# Patient Record
Sex: Female | Born: 1990 | Race: Black or African American | Hispanic: No | Marital: Single | Smoking: Former smoker
Health system: Southern US, Community
[De-identification: ages and names within clinical notes are randomized; demographics above are authoritative.]

## PROBLEM LIST (undated history)

## (undated) ENCOUNTER — Inpatient Hospital Stay (HOSPITAL_COMMUNITY): Payer: Self-pay

## (undated) ENCOUNTER — Emergency Department (HOSPITAL_COMMUNITY): Discharge status: Against Medical Advice | Payer: Self-pay

## (undated) DIAGNOSIS — O98812 Other maternal infectious and parasitic diseases complicating pregnancy, second trimester: Secondary | ICD-10-CM

## (undated) DIAGNOSIS — A749 Chlamydial infection, unspecified: Secondary | ICD-10-CM

## (undated) DIAGNOSIS — K219 Gastro-esophageal reflux disease without esophagitis: Secondary | ICD-10-CM

## (undated) DIAGNOSIS — F1911 Other psychoactive substance abuse, in remission: Secondary | ICD-10-CM

## (undated) DIAGNOSIS — O139 Gestational [pregnancy-induced] hypertension without significant proteinuria, unspecified trimester: Secondary | ICD-10-CM

## (undated) HISTORY — DX: Chlamydial infection, unspecified: A74.9

## (undated) HISTORY — DX: Other maternal infectious and parasitic diseases complicating pregnancy, second trimester: O98.812

## (undated) HISTORY — DX: Other psychoactive substance abuse, in remission: F19.11

## (undated) HISTORY — PX: WISDOM TOOTH EXTRACTION: SHX21

## (undated) HISTORY — PX: DILATION AND CURETTAGE OF UTERUS: SHX78

---
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
## 2008-06-09 ENCOUNTER — Emergency Department (HOSPITAL_COMMUNITY): Admission: EM | Admit: 2008-06-09 | Discharge: 2008-06-09 | Payer: Self-pay | Admitting: Emergency Medicine

## 2009-07-16 ENCOUNTER — Emergency Department (HOSPITAL_COMMUNITY): Admission: EM | Admit: 2009-07-16 | Discharge: 2009-07-16 | Payer: Self-pay | Admitting: Family Medicine

## 2010-09-13 LAB — POCT URINALYSIS DIP (DEVICE)
Glucose, UA: NEGATIVE mg/dL
Ketones, ur: NEGATIVE mg/dL
Specific Gravity, Urine: >=1.03 (ref 1.005–1.030)
Urobilinogen, UA: 0.2 mg/dL (ref 0.0–1.0)

## 2010-09-13 LAB — URINE CULTURE: Colony Count: 2000

## 2010-09-13 LAB — POCT PREGNANCY, URINE: Preg Test, Ur: POSITIVE

## 2012-09-06 ENCOUNTER — Encounter (HOSPITAL_COMMUNITY): Payer: Self-pay | Admitting: Family

## 2012-09-06 ENCOUNTER — Inpatient Hospital Stay (HOSPITAL_COMMUNITY)
Admission: AD | Admit: 2012-09-06 | Discharge: 2012-09-06 | Disposition: A | Discharge status: Home / Self Care | Payer: Self-pay | Source: Ambulatory Visit | Attending: Obstetrics and Gynecology | Admitting: Obstetrics and Gynecology

## 2012-09-06 DIAGNOSIS — B9689 Other specified bacterial agents as the cause of diseases classified elsewhere: Secondary | ICD-10-CM | POA: Insufficient documentation

## 2012-09-06 DIAGNOSIS — N390 Urinary tract infection, site not specified: Secondary | ICD-10-CM | POA: Insufficient documentation

## 2012-09-06 DIAGNOSIS — A499 Bacterial infection, unspecified: Secondary | ICD-10-CM | POA: Insufficient documentation

## 2012-09-06 DIAGNOSIS — R109 Unspecified abdominal pain: Secondary | ICD-10-CM | POA: Insufficient documentation

## 2012-09-06 DIAGNOSIS — N760 Acute vaginitis: Secondary | ICD-10-CM | POA: Insufficient documentation

## 2012-09-06 LAB — URINE MICROSCOPIC-ADD ON

## 2012-09-06 LAB — URINALYSIS, ROUTINE W REFLEX MICROSCOPIC
Bilirubin Urine: NEGATIVE
Ketones, ur: NEGATIVE mg/dL
Nitrite: NEGATIVE
Protein, ur: NEGATIVE mg/dL
Urobilinogen, UA: 0.2 mg/dL (ref 0.0–1.0)

## 2012-09-06 LAB — WET PREP, GENITAL

## 2012-09-06 LAB — POCT PREGNANCY, URINE: Preg Test, Ur: NEGATIVE

## 2012-09-06 MED ORDER — SULFAMETHOXAZOLE-TRIMETHOPRIM 800-160 MG PO TABS: 1.0000 | ORAL_TABLET | Freq: Two times a day (BID) | ORAL | Status: DC

## 2012-09-06 MED ORDER — METRONIDAZOLE 500 MG PO TABS: 500.0000 mg | ORAL_TABLET | Freq: Two times a day (BID) | ORAL | Status: DC

## 2012-09-06 NOTE — MAU Note (Signed)
Pt states here for lower abd pain noted x2-3 days. Denies bleeding or vag d/c changes.

## 2012-09-06 NOTE — MAU Provider Note (Signed)
History     CSN: [id number]  Arrival date and time: 09/06/12 1624   First Provider Initiated Contact with Patient 09/06/12 1655      Chief Complaint  Patient presents with  . Abdominal Pain   HPI  Pt is here with report of lower abdominal pain x 2-3.  Denies bleeding or vaginal discharge changes.  Pain is in the lower midpelvis.  Patient's last menstrual period was 08/16/2012.  +sexually active, no birth control method at this time.    No past medical history on file.  Past Surgical History  Procedure Laterality Date  . Wisdom tooth extraction      No family history on file.  History  Substance Use Topics  . Smoking status: Not on file  . Smokeless tobacco: Not on file  . Alcohol Use: Not on file    Allergies: No Known Allergies  No prescriptions prior to admission    Review of Systems  Gastrointestinal: Positive for nausea. Negative for vomiting. Abdominal pain: midpelvis.  Genitourinary: Negative.    Physical Exam   Last menstrual period 08/16/2012.  Physical Exam  Constitutional: She is oriented to person, place, and time. She appears well-developed and well-nourished.  HENT:  Head: Normocephalic.  Neck: Normal range of motion. Neck supple.  Cardiovascular: Normal rate, regular rhythm and normal heart sounds.   Respiratory: Effort normal and breath sounds normal. No respiratory distress.  GI: Soft. She exhibits no mass. There is tenderness (lower midpelvis). There is no rebound, no guarding and no CVA tenderness.  Genitourinary: Cervix exhibits no motion tenderness, no discharge and no friability. Right adnexum displays no mass, no tenderness and no fullness. Left adnexum displays no mass, no tenderness and no fullness. Vaginal discharge (white, creamy) found.  Musculoskeletal: Normal range of motion.  Neurological: She is alert and oriented to person, place, and time.  Skin: Skin is warm and dry.  Psychiatric: She has a normal mood and affect.   Filed  Vitals:   09/06/12 1652  BP: 114/65  Pulse: 90  Temp: 97.8 F (36.6 C)  Resp: 16     MAU Course  Procedures  Results for orders placed during the hospital encounter of 09/06/12 (from the past 24 hour(s))  URINALYSIS, ROUTINE W REFLEX MICROSCOPIC     Status: Abnormal   Collection Time    09/06/12  4:32 PM      Result Value Range   Color, Urine YELLOW  YELLOW   APPearance CLEAR  CLEAR   Specific Gravity, Urine 1.015  1.005 - 1.030   pH 6.0  5.0 - 8.0   Glucose, UA NEGATIVE  NEGATIVE mg/dL   Hgb urine dipstick TRACE (*) NEGATIVE   Bilirubin Urine NEGATIVE  NEGATIVE   Ketones, ur NEGATIVE  NEGATIVE mg/dL   Protein, ur NEGATIVE  NEGATIVE mg/dL   Urobilinogen, UA 0.2  0.0 - 1.0 mg/dL   Nitrite NEGATIVE  NEGATIVE   Leukocytes, UA MODERATE (*) NEGATIVE  URINE MICROSCOPIC-ADD ON     Status: Abnormal   Collection Time    09/06/12  4:32 PM      Result Value Range   Squamous Epithelial / LPF FEW (*) RARE   WBC, UA 7-10  <3 WBC/hpf   Bacteria, UA FEW (*) RARE  POCT PREGNANCY, URINE     Status: None   Collection Time    09/06/12  4:55 PM      Result Value Range   Preg Test, Ur NEGATIVE  NEGATIVE  WET PREP, GENITAL  Status: Abnormal   Collection Time    09/06/12  5:05 PM      Result Value Range   Yeast Wet Prep HPF POC NONE SEEN  NONE SEEN   Trich, Wet Prep NONE SEEN  NONE SEEN   Clue Cells Wet Prep HPF POC MODERATE (*) NONE SEEN   WBC, Wet Prep HPF POC MANY (*) NONE SEEN     Assessment and Plan  UTI Bacterial Vaginosis  Plan: RX flagyl Bactrim DS Follow-up prn  [facility] 09/06/2012, 4:56 PM

## 2012-09-07 LAB — URINE CULTURE

## 2012-09-09 ENCOUNTER — Emergency Department (HOSPITAL_COMMUNITY)
Admission: EM | Admit: 2012-09-09 | Discharge: 2012-09-09 | Disposition: A | Discharge status: Home / Self Care | Payer: No Typology Code available for payment source | Attending: Emergency Medicine | Admitting: Emergency Medicine

## 2012-09-09 ENCOUNTER — Encounter (HOSPITAL_COMMUNITY): Payer: Self-pay | Admitting: Emergency Medicine

## 2012-09-09 DIAGNOSIS — S161XXA Strain of muscle, fascia and tendon at neck level, initial encounter: Secondary | ICD-10-CM

## 2012-09-09 DIAGNOSIS — S46909A Unspecified injury of unspecified muscle, fascia and tendon at shoulder and upper arm level, unspecified arm, initial encounter: Secondary | ICD-10-CM | POA: Insufficient documentation

## 2012-09-09 DIAGNOSIS — F17200 Nicotine dependence, unspecified, uncomplicated: Secondary | ICD-10-CM | POA: Insufficient documentation

## 2012-09-09 DIAGNOSIS — S139XXA Sprain of joints and ligaments of unspecified parts of neck, initial encounter: Secondary | ICD-10-CM | POA: Insufficient documentation

## 2012-09-09 DIAGNOSIS — R21 Rash and other nonspecific skin eruption: Secondary | ICD-10-CM | POA: Insufficient documentation

## 2012-09-09 DIAGNOSIS — R209 Unspecified disturbances of skin sensation: Secondary | ICD-10-CM | POA: Insufficient documentation

## 2012-09-09 MED ORDER — METHOCARBAMOL 750 MG PO TABS: 750.0000 mg | ORAL_TABLET | Freq: Four times a day (QID) | ORAL | Status: DC | PRN

## 2012-09-09 MED ORDER — HYDROCODONE-ACETAMINOPHEN 5-325 MG PO TABS: 1.0000 | ORAL_TABLET | Freq: Four times a day (QID) | ORAL | Status: DC | PRN

## 2012-09-09 MED ORDER — NAPROXEN 500 MG PO TABS: 500.0000 mg | ORAL_TABLET | Freq: Two times a day (BID) | ORAL | Status: DC | PRN
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
## 2012-09-09 NOTE — ED Provider Notes (Signed)
Medical screening examination/treatment/procedure(s) were performed by non-physician practitioner and as supervising physician I was immediately available for consultation/collaboration.    Vida Roller, MD 09/09/12 [phone]

## 2012-09-12 NOTE — MAU Provider Note (Signed)
Attestation of Attending Supervision of Advanced Practitioner: Evaluation and management procedures were performed by the PA/NP/CNM/OB Fellow under my supervision/collaboration. Chart reviewed and agree with management and plan.  FERGUSON,JOHN V 09/12/2012 3:05 PM   

## 2013-03-21 ENCOUNTER — Ambulatory Visit: Payer: Self-pay

## 2013-03-26 ENCOUNTER — Encounter: Payer: Self-pay | Admitting: *Deleted

## 2013-04-02 ENCOUNTER — Encounter: Payer: Self-pay | Admitting: *Deleted

## 2013-04-02 ENCOUNTER — Ambulatory Visit (INDEPENDENT_AMBULATORY_CARE_PROVIDER_SITE_OTHER): Payer: Self-pay | Admitting: Obstetrics & Gynecology

## 2013-04-02 VITALS — BP 124/76 | Wt 239.0 lb

## 2013-04-02 DIAGNOSIS — Z3401 Encounter for supervision of normal first pregnancy, first trimester: Secondary | ICD-10-CM

## 2013-04-02 DIAGNOSIS — Z3400 Encounter for supervision of normal first pregnancy, unspecified trimester: Secondary | ICD-10-CM
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
## 2013-04-02 NOTE — Progress Notes (Signed)
Patient is unsure of her LMP.  Bedside ultrasound shows femur length dating at 15wks 4days.  Baby is active on ultrasound and patient is doing well.  She was not on birth control when she conceived.  She will follow up this Friday to see a physician since she is further along than she expected.  Routine blood work is done today.

## 2013-04-03 LAB — OBSTETRIC PANEL
Antibody Screen: NEGATIVE
Basophils Absolute: 0 10*3/uL (ref 0.0–0.1)
HCT: 33.9 % — ABNORMAL LOW (ref 36.0–46.0)
Hemoglobin: 11.3 g/dL — ABNORMAL LOW (ref 12.0–15.0)
Lymphocytes Relative: 16 % (ref 12–46)
Lymphs Abs: 1.4 10*3/uL (ref 0.7–4.0)
Monocytes Absolute: 0.5 10*3/uL (ref 0.1–1.0)
Neutro Abs: 6.5 10*3/uL (ref 1.7–7.7)
RBC: 3.82 MIL/uL — ABNORMAL LOW (ref 3.87–5.11)
RDW: 13.2 % (ref 11.5–15.5)
Rubella: 11.5 Index — ABNORMAL HIGH (ref <0.90)
WBC: 8.5 10*3/uL (ref 4.0–10.5)

## 2013-04-04 LAB — HEMOGLOBINOPATHY EVALUATION
Hgb F Quant: 0.2 % (ref 0.0–2.0)
Hgb S Quant: 0 %

## 2013-04-05 LAB — CYSTIC FIBROSIS DIAGNOSTIC STUDY

## 2013-04-06 ENCOUNTER — Encounter: Payer: Self-pay | Admitting: Obstetrics & Gynecology

## 2013-04-10 ENCOUNTER — Encounter: Payer: Self-pay | Admitting: Obstetrics & Gynecology

## 2013-04-10 ENCOUNTER — Ambulatory Visit (INDEPENDENT_AMBULATORY_CARE_PROVIDER_SITE_OTHER): Payer: Self-pay | Admitting: Obstetrics & Gynecology

## 2013-04-10 VITALS — BP 120/80 | Wt 237.0 lb

## 2013-04-10 DIAGNOSIS — Z3480 Encounter for supervision of other normal pregnancy, unspecified trimester: Secondary | ICD-10-CM

## 2013-04-10 DIAGNOSIS — Z124 Encounter for screening for malignant neoplasm of cervix: Secondary | ICD-10-CM

## 2013-04-10 DIAGNOSIS — O98319 Other infections with a predominantly sexual mode of transmission complicating pregnancy, unspecified trimester: Secondary | ICD-10-CM

## 2013-04-10 DIAGNOSIS — Z113 Encounter for screening for infections with a predominantly sexual mode of transmission: Secondary | ICD-10-CM

## 2013-04-10 DIAGNOSIS — O99212 Obesity complicating pregnancy, second trimester: Secondary | ICD-10-CM

## 2013-04-10 DIAGNOSIS — A568 Sexually transmitted chlamydial infection of other sites: Secondary | ICD-10-CM

## 2013-04-10 DIAGNOSIS — O09299 Supervision of pregnancy with other poor reproductive or obstetric history, unspecified trimester: Secondary | ICD-10-CM | POA: Insufficient documentation

## 2013-04-10 DIAGNOSIS — Z3492 Encounter for supervision of normal pregnancy, unspecified, second trimester: Secondary | ICD-10-CM

## 2013-04-10 DIAGNOSIS — E669 Obesity, unspecified: Secondary | ICD-10-CM

## 2013-04-10 DIAGNOSIS — A749 Chlamydial infection, unspecified: Secondary | ICD-10-CM

## 2013-04-10 DIAGNOSIS — Z3490 Encounter for supervision of normal pregnancy, unspecified, unspecified trimester: Secondary | ICD-10-CM | POA: Insufficient documentation

## 2013-04-10 DIAGNOSIS — R21 Rash and other nonspecific skin eruption: Secondary | ICD-10-CM

## 2013-04-10 DIAGNOSIS — O99210 Obesity complicating pregnancy, unspecified trimester: Secondary | ICD-10-CM | POA: Insufficient documentation

## 2013-04-10 DIAGNOSIS — Z8759 Personal history of other complications of pregnancy, childbirth and the puerperium: Secondary | ICD-10-CM

## 2013-04-10 DIAGNOSIS — Z8619 Personal history of other infectious and parasitic diseases: Secondary | ICD-10-CM

## 2013-04-10 HISTORY — DX: Chlamydial infection, unspecified: A74.9

## 2013-04-10 NOTE — Progress Notes (Signed)
   Subjective:    Michele Hayden is a G2P0010 [redacted]w[redacted]d being seen today for her first obstetrical visit.  Her obstetrical history is significant for obesity and one TAB. Patient does intend to breast feed. Pregnancy history fully reviewed.  Patient reports no complaints.  Filed Vitals:   04/10/13 0931  BP: 120/80  Weight: 237 lb (107.502 kg)    HISTORY: OB History  Gravida Para Term Preterm AB SAB TAB Ectopic Multiple Living  2    1  1        # Outcome Date GA Lbr Len/2nd Weight Sex Delivery Anes PTL Lv  2 CUR           1 TAB              No past medical history on file. Past Surgical History  Procedure Laterality Date  . Wisdom tooth extraction    . Dilation and curettage of uterus     Family History  Problem Relation Age of Onset  . Asthma Brother   . Diabetes Paternal Grandmother   . Kidney disease Paternal Grandmother      Exam    Uterus:  Fundal Height: 17 cm  Pelvic Exam:    Perineum: No Hemorrhoids, Normal Perineum   Vulva: normal   Vagina:  normal mucosa, normal discharge   Cervix: normal with some bleeding following Pap   Adnexa: normal adnexa and no mass, fullness, tenderness   Bony Pelvis: average  System: Breast:  normal appearance, no masses or tenderness   Skin: normal coloration and turgor, rashes noted on torso, rashes are plaques, no erythema, flesh colored. No pruritus or pain.   Neurologic: oriented, normal, negative   Extremities: normal strength, tone, and muscle mass   HEENT PERRLA   Mouth/Teeth mucous membranes moist, pharynx normal without lesions and dental hygiene good   Neck supple and no masses   Cardiovascular: regular rate and rhythm   Respiratory:  appears well, vitals normal, no respiratory distress, acyanotic, normal RR, ear and throat exam is normal, neck free of mass or lymphadenopathy, chest clear, no wheezing, crepitations, rhonchi, normal symmetric air entry   Abdomen: soft, non-tender; bowel sounds normal; no masses,  no  organomegaly   Urinary: urethral meatus normal      Assessment:    Pregnancy: G2P0010 Patient Active Problem List   Diagnosis Date Noted  . Supervision of normal pregnancy 04/10/2013  . Obesity in pregnancy, antepartum 04/10/2013  Skin lesions   Plan:   Initial labs reviewed. Continue Prenatal vitamins. Problem list reviewed and updated. Genetic Screening discussed Quad Screen: ordered. Ultrasound discussed; fetal survey: ordered. Recommended to follow up with Dermatology for rash evaluation Follow up in 4 weeks. Jaynie Collins, MD, FACOG Attending Obstetrician & Gynecologist Faculty Practice, [facility] [place]

## 2013-04-10 NOTE — Progress Notes (Signed)
Having frequent headaches.  Feels very fatigued.

## 2013-04-10 NOTE — Patient Instructions (Addendum)
Pregnancy and obesity: Know the risks Concerned about pregnancy and obesity? Understand the risks of obesity during pregnancy - plus steps to promote a healthy pregnancy.By [facility] Staff  Being obese during pregnancy can have a major impact on your health and your baby's health. Find out about the possible complications, recommendations for weight gain and what you can do to promote a healthy pregnancy. What's considered obese? Obesity is defined as having an excessive amount of body fat. A formula based on height and weight - called the body mass index (BMI) - is often used to determine if a person is obese.  BMI Weight status  Below 18.5 Underweight  18.5-24.9 Normal  25-29.9 Overweight  30 and higher Obese  40 and higher Extreme obesity  Could obesity affect my ability to get pregnant? Being obese can harm your fertility by inhibiting normal ovulation. Obesity can also affect the outcome of in vitro fertilization (IVF). As a woman's BMI increases, so does the risk of unsuccessful IVF. How might obesity affect my pregnancy? Being obese during pregnancy increases the risk of various pregnancy complications, including: Gestational diabetes. Women who are obese are more likely to have diabetes that develops during pregnancy (gestational diabetes) than are women who have a normal weight.  Preeclampsia. Women who are obese are at increased risk of developing high blood pressure and protein in the urine after 20 weeks of pregnancy (preeclampsia).  Infection. Women who are obese during pregnancy are at increased risk of urinary tract infections. Obesity also increases the risk of postpartum infection, whether the baby is delivered vaginally or by C-section.  Thrombosis. Women who are obese during pregnancy are at increased risk of a serious condition in which a blood clot forms inside a blood vessel (thrombosis).  Obstructive sleep apnea. Women who are obese during pregnancy might be at increased  risk of a potentially serious sleep disorder in which breathing repeatedly stops and starts (obstructive sleep apnea). Pregnancy might also worsen existing obstructive sleep apnea.  Overdue pregnancy. Obesity increases the risk that pregnancy will continue beyond the expected due date.  Labor problems. Labor induction is more common in women who are obese. Obesity can also interfere with the use of certain types of pain medication, such as an epidural block.  C-section. Obesity during pregnancy increases the likelihood of elective and emergency C-sections. Obesity also increases the risk of C-section complications, such as delayed healing and wound infections. Women who are obese are also less likely to have a successful vaginal delivery after a C-section (VBAC).  Pregnancy loss. Obesity increases the risk of miscarriage and stillbirth.  How might obesity affect my baby? Obesity during pregnancy can cause various health problems for a baby, including:  Macrosomia. Women who are obese are at increased risk of delivering an infant who is significantly larger than average (macrosomia) and has more body fat than normal. Research suggests that as birth weight increases, so does the risk of childhood obesity.  Chronic conditions. Being obese during pregnancy might increase the risk that your baby will develop heart disease or diabetes as an adult.  Birth defects. Research suggests that obesity during pregnancy slightly increases the risk of having a baby who's born with a birth defect, such as a problem with the heart or a condition affecting the brain or spinal cord (neural tube defect). How much weight should I gain during pregnancy? Your pre-pregnancy weight and body mass index (BMI), as well as your health and your baby's health, all play a  role in determining how much weight you need to gain during pregnancy. Work with your health care provider to determine what's best in your case and to manage your  weight throughout pregnancy.  Start by considering these general guidelines for pregnancy weight gain and obesity:  Single pregnancy. If you're obese and carrying one baby, the recommended weight gain is 11 to 20 pounds (about 5 to 9 kilograms).  Multiple pregnancy. If you're obese and carrying twins or multiples, the recommended weight gain is 25 to 42 pounds (about 11 to 19 kilograms). Still, some research suggests that women who are obese can safely gain less weight than the guidelines recommend.  Rather than gaining or losing a specific amount of weight during pregnancy, your health care provider might encourage you to focus on avoiding excessive weight gain during pregnancy.  Will I need specialized care during pregnancy? If you're obese, your health care provider will closely monitor your pregnancy. Depending on the circumstances, your health care provider might recommend:  Early testing for gestational diabetes. For women at average risk of gestational diabetes, a screening test called the glucose challenge test is often done between weeks 24 and 28 of pregnancy. If you're obese and pregnant, your health care provider might recommend the screening test earlier in your pregnancy - perhaps even at your first prenatal visit. If your test results are normal, you'll likely repeat the screening test between weeks 24 and 28 of pregnancy.  Delayed fetal ultrasound. Fetal ultrasound is an imaging technique that uses high-frequency sound waves to produce images of the baby in your uterus. Fetal ultrasound is typically done between weeks 18 and 20 of pregnancy to evaluate a baby's growth and development. Since ultrasound waves don't easily penetrate abdominal fat tissue, however, obesity during pregnancy can interfere with the effectiveness of fetal ultrasound. Ultrasound results might be more detailed if the test is done a few weeks later, such as between weeks 20 and 22 of pregnancy.  Fetal echocardiography.  Your health care provider might recommend a fetal ultrasound that provides a detailed picture of your baby's heart (fetal echocardiography) between weeks 20 and 22 of pregnancy. This test is used to rule out or confirm a congenital heart defect.  Frequent prenatal visits. As your pregnancy progresses, your health care provider might recommend more frequent prenatal visits to monitor your health and your baby's health. Regular fetal ultrasounds might be recommended as well. The ultrasounds can help your health care provider evaluate your baby's growth and plan for your delivery. What steps can I take to promote a healthy pregnancy? You can limit the impact of obesity on your pregnancy and ensure your health and your baby's health. For example: Schedule a preconception appointment. If you're obese and you're considering getting pregnant, talk to your health care provider. He or she might recommend a daily prenatal vitamin and refer you to other health care providers - such as a registered dietitian or an obesity specialist - who can help you make changes in your lifestyle and reach a healthy weight before pregnancy.  Seek regular prenatal care. Prenatal visits can help your health care provider monitor your health and your baby's health. Tell your health care provider about any medical conditions you might have - such as diabetes, high blood pressure or sleep apnea - and discuss what you can do to manage them.  Eat a healthy diet. Work with your health care provider or a registered dietitian to maintain a healthy diet and avoid excessive weight  gain. Keep in mind that during pregnancy, you'll need more folic acid, calcium, iron and other essential nutrients. A daily prenatal vitamin can help fill any gaps. Consult your health care provider if you have special nutritional needs due to a health condition, such as diabetes.  Be physically active. Consult your health care provider about safe ways to stay physically  active during your pregnancy.  Avoid risky substances. If you smoke, ask your health care provider to help you quit. Alcohol and illicit drugs are off-limits, too. Get your health care provider's OK before you start - or stop - taking any medications or supplements. Obesity during pregnancy can increase the risk of complications for you and your baby. To ease your anxiety, work closely with your health care provider. He or she can help you avoid excessive weight gain, manage any medical conditions, and monitor your baby's growth and development.  Pregnancy - Second Trimester The second trimester of pregnancy (3 to 6 months) is a period of rapid growth for you and your baby. At the end of the sixth month, your baby is about 9 inches long and weighs 1 1/2 pounds. You will begin to feel the baby move between 18 and 20 weeks of the pregnancy. This is called quickening. Weight gain is faster. A clear fluid (colostrum) may leak out of your breasts. You may feel small contractions of the womb (uterus). This is known as false labor or Braxton-Hicks contractions. This is like a practice for labor when the baby is ready to be born. Usually, the problems with morning sickness have usually passed by the end of your first trimester. Some women develop small dark blotches (called cholasma, mask of pregnancy) on their face that usually goes away after the baby is born. Exposure to the sun makes the blotches worse. Acne may also develop in some pregnant women and pregnant women who have acne, may find that it goes away. PRENATAL EXAMS  Blood work may continue to be done during prenatal exams. These tests are done to check on your health and the probable health of your baby. Blood work is used to follow your blood levels (hemoglobin). Anemia (low hemoglobin) is common during pregnancy. Iron and vitamins are given to help prevent this. You will also be checked for diabetes between 24 and 28 weeks of the pregnancy. Some of the  previous blood tests may be repeated.  The size of the uterus is measured during each visit. This is to make sure that the baby is continuing to grow properly according to the dates of the pregnancy.  Your blood pressure is checked every prenatal visit. This is to make sure you are not getting toxemia.  Your urine is checked to make sure you do not have an infection, diabetes or protein in the urine.  Your weight is checked often to make sure gains are happening at the suggested rate. This is to ensure that both you and your baby are growing normally.  Sometimes, an ultrasound is performed to confirm the proper growth and development of the baby. This is a test which bounces harmless sound waves off the baby so your caregiver can more accurately determine due dates. Sometimes, a test is done on the amniotic fluid surrounding the baby. This test is called an amniocentesis. The amniotic fluid is obtained by sticking a needle into the belly (abdomen). This is done to check the chromosomes in instances where there is a concern about possible genetic problems with the baby. It  is also sometimes done near the end of pregnancy if an early delivery is required. In this case, it is done to help make sure the baby's lungs are mature enough for the baby to live outside of the womb. CHANGES OCCURING IN THE SECOND TRIMESTER OF PREGNANCY Your body goes through many changes during pregnancy. They vary from person to person. Talk to your caregiver about changes you notice that you are concerned about.  During the second trimester, you will likely have an increase in your appetite. It is normal to have cravings for certain foods. This varies from person to person and pregnancy to pregnancy.  Your lower abdomen will begin to bulge.  You may have to urinate more often because the uterus and baby are pressing on your bladder. It is also common to get more bladder infections during pregnancy. You can help this by  drinking lots of fluids and emptying your bladder before and after intercourse.  You may begin to get stretch marks on your hips, abdomen, and breasts. These are normal changes in the body during pregnancy. There are no exercises or medicines to take that prevent this change.  You may begin to develop swollen and bulging veins (varicose veins) in your legs. Wearing support hose, elevating your feet for 15 minutes, 3 to 4 times a day and limiting salt in your diet helps lessen the problem.  Heartburn may develop as the uterus grows and pushes up against the stomach. Antacids recommended by your caregiver helps with this problem. Also, eating smaller meals 4 to 5 times a day helps.  Constipation can be treated with a stool softener or adding bulk to your diet. Drinking lots of fluids, and eating vegetables, fruits, and whole grains are helpful.  Exercising is also helpful. If you have been very active up until your pregnancy, most of these activities can be continued during your pregnancy. If you have been less active, it is helpful to start an exercise program such as walking.  Hemorrhoids may develop at the end of the second trimester. Warm sitz baths and hemorrhoid cream recommended by your caregiver helps hemorrhoid problems.  Backaches may develop during this time of your pregnancy. Avoid heavy lifting, wear low heal shoes, and practice good posture to help with backache problems.  Some pregnant women develop tingling and numbness of their hand and fingers because of swelling and tightening of ligaments in the wrist (carpel tunnel syndrome). This goes away after the baby is born.  As your breasts enlarge, you may have to get a bigger bra. Get a comfortable, cotton, support bra. Do not get a nursing bra until the last month of the pregnancy if you will be nursing the baby.  You may get a dark line from your belly button to the pubic area called the linea nigra.  You may develop rosy cheeks  because of increase blood flow to the face.  You may develop spider looking lines of the face, neck, arms, and chest. These go away after the baby is born. HOME CARE INSTRUCTIONS   It is extremely important to avoid all smoking, herbs, alcohol, and unprescribed drugs during your pregnancy. These chemicals affect the formation and growth of the baby. Avoid these chemicals throughout the pregnancy to ensure the delivery of a healthy infant.  Most of your home care instructions are the same as suggested for the first trimester of your pregnancy. Keep your caregiver's appointments. Follow your caregiver's instructions regarding medicine use, exercise, and diet.  During pregnancy, you are providing food for you and your baby. Continue to eat regular, well-balanced meals. Choose foods such as meat, fish, milk and other low fat dairy products, vegetables, fruits, and whole-grain breads and cereals. Your caregiver will tell you of the ideal weight gain.  A physical sexual relationship may be continued up until near the end of pregnancy if there are no other problems. Problems could include early (premature) leaking of amniotic fluid from the membranes, vaginal bleeding, abdominal pain, or other medical or pregnancy problems.  Exercise regularly if there are no restrictions. Check with your caregiver if you are unsure of the safety of some of your exercises. The greatest weight gain will occur in the last 2 trimesters of pregnancy. Exercise will help you:  Control your weight.  Get you in shape for labor and delivery.  Lose weight after you have the baby.  Wear a good support or jogging bra for breast tenderness during pregnancy. This may help if worn during sleep. Pads or tissues may be used in the bra if you are leaking colostrum.  Do not use hot tubs, steam rooms or saunas throughout the pregnancy.  Wear your seat belt at all times when driving. This protects you and your baby if you are in an  accident.  Avoid raw meat, uncooked cheese, cat litter boxes, and soil used by cats. These carry germs that can cause birth defects in the baby.  The second trimester is also a good time to visit your dentist for your dental health if this has not been done yet. Getting your teeth cleaned is okay. Use a soft toothbrush. Brush gently during pregnancy.  It is easier to leak urine during pregnancy. Tightening up and strengthening the pelvic muscles will help with this problem. Practice stopping your urination while you are going to the bathroom. These are the same muscles you need to strengthen. It is also the muscles you would use as if you were trying to stop from passing gas. You can practice tightening these muscles up 10 times a set and repeating this about 3 times per day. Once you know what muscles to tighten up, do not perform these exercises during urination. It is more likely to contribute to an infection by backing up the urine.  Ask for help if you have financial, counseling, or nutritional needs during pregnancy. Your caregiver will be able to offer counseling for these needs as well as refer you for other special needs.  Your skin may become oily. If so, wash your face with mild soap, use non-greasy moisturizer and oil or cream based makeup. MEDICINES AND DRUG USE IN PREGNANCY  Take prenatal vitamins as directed. The vitamin should contain 1 milligram of folic acid. Keep all vitamins out of reach of children. Only a couple vitamins or tablets containing iron may be fatal to a baby or young child when ingested.  Avoid use of all medicines, including herbs, over-the-counter medicines, not prescribed or suggested by your caregiver. Only take over-the-counter or prescription medicines for pain, discomfort, or fever as directed by your caregiver. Do not use aspirin.  Let your caregiver also know about herbs you may be using.  Alcohol is related to a number of birth defects. This includes  fetal alcohol syndrome. All alcohol, in any form, should be avoided completely. Smoking will cause low birth rate and premature babies.  Street or illegal drugs are very harmful to the baby. They are absolutely forbidden. A baby born to an  addicted mother will be addicted at birth. The baby will go through the same withdrawal an adult does. SEEK MEDICAL CARE IF:  You have any concerns or worries during your pregnancy. It is better to call with your questions if you feel they cannot wait, rather than worry about them. SEEK IMMEDIATE MEDICAL CARE IF:   An unexplained oral temperature above 102 F (38.9 C) develops, or as your caregiver suggests.  You have leaking of fluid from the vagina (birth canal). If leaking membranes are suspected, take your temperature and tell your caregiver of this when you call.  There is vaginal spotting, bleeding, or passing clots. Tell your caregiver of the amount and how many pads are used. Light spotting in pregnancy is common, especially following intercourse.  You develop a bad smelling vaginal discharge with a change in the color from clear to white.  You continue to feel sick to your stomach (nauseated) and have no relief from remedies suggested. You vomit blood or coffee ground-like materials.  You lose more than 2 pounds of weight or gain more than 2 pounds of weight over 1 week, or as suggested by your caregiver.  You notice swelling of your face, hands, feet, or legs.  You get exposed to [place] measles and have never had them.  You are exposed to fifth disease or chickenpox.  You develop belly (abdominal) pain. Round ligament discomfort is a common non-cancerous (benign) cause of abdominal pain in pregnancy. Your caregiver still must evaluate you.  You develop a bad headache that does not go away.  You develop fever, diarrhea, pain with urination, or shortness of breath.  You develop visual problems, blurry, or double vision.  You fall or are in  a car accident or any kind of trauma.  There is mental or physical violence at home. Document Released: 06/08/2001 Document Revised: 03/08/2012 Document Reviewed: 12/11/2008 [facility] Patient Information 2014 [place], [place].

## 2013-04-11 ENCOUNTER — Encounter: Payer: Self-pay | Admitting: Obstetrics & Gynecology

## 2013-04-12 ENCOUNTER — Encounter: Payer: Self-pay | Admitting: Obstetrics & Gynecology

## 2013-04-12 MED ORDER — AZITHROMYCIN 500 MG PO TABS: 1000.0000 mg | ORAL_TABLET | Freq: Once | ORAL | Status: DC

## 2013-04-12 NOTE — Addendum Note (Signed)
Addended by: Jaynie Collins A on: 04/12/2013 05:18 PM   Modules accepted: Orders

## 2013-04-16 ENCOUNTER — Encounter: Payer: Self-pay | Admitting: Obstetrics & Gynecology

## 2013-04-16 LAB — AFP, QUAD SCREEN
Age Alone: 1:1090 {titer}
Down Syndrome Scr Risk Est: 1:1070 {titer}
HCG, Total: 31000 m[IU]/mL
INH: 240.8 pg/mL
MoM for INH: 1.67
MoM for hCG: 1.79
Open Spina bifida: NEGATIVE
Tri 18 Scr Risk Est: NEGATIVE

## 2013-04-18 ENCOUNTER — Telehealth: Payer: Self-pay | Admitting: *Deleted

## 2013-04-18 NOTE — Telephone Encounter (Signed)
Pt aware of positive Chlamydia results.  Pt will come in tomorrow at 11am to get treatment.

## 2013-04-19 ENCOUNTER — Ambulatory Visit: Payer: Self-pay

## 2013-04-24 ENCOUNTER — Other Ambulatory Visit: Payer: Self-pay | Admitting: Obstetrics & Gynecology

## 2013-04-24 ENCOUNTER — Ambulatory Visit (HOSPITAL_COMMUNITY)
Admission: RE | Admit: 2013-04-24 | Discharge: 2013-04-24 | Disposition: A | Discharge status: Home / Self Care | Payer: No Typology Code available for payment source | Source: Ambulatory Visit | Attending: Obstetrics & Gynecology | Admitting: Obstetrics & Gynecology

## 2013-04-24 DIAGNOSIS — Z3492 Encounter for supervision of normal pregnancy, unspecified, second trimester: Secondary | ICD-10-CM

## 2013-04-24 DIAGNOSIS — O358XX0 Maternal care for other (suspected) fetal abnormality and damage, not applicable or unspecified: Secondary | ICD-10-CM | POA: Insufficient documentation

## 2013-04-24 DIAGNOSIS — Z363 Encounter for antenatal screening for malformations: Secondary | ICD-10-CM | POA: Insufficient documentation

## 2013-04-24 DIAGNOSIS — E669 Obesity, unspecified: Secondary | ICD-10-CM | POA: Insufficient documentation

## 2013-04-24 DIAGNOSIS — Z1389 Encounter for screening for other disorder: Secondary | ICD-10-CM | POA: Insufficient documentation

## 2013-04-26 ENCOUNTER — Encounter: Payer: Self-pay | Admitting: Obstetrics & Gynecology

## 2013-05-04 ENCOUNTER — Encounter: Payer: Self-pay | Admitting: Obstetrics & Gynecology

## 2013-05-09 ENCOUNTER — Encounter: Payer: Self-pay | Admitting: Obstetrics and Gynecology

## 2013-05-15 ENCOUNTER — Encounter (HOSPITAL_COMMUNITY): Payer: Self-pay | Admitting: Emergency Medicine

## 2013-05-15 ENCOUNTER — Emergency Department (INDEPENDENT_AMBULATORY_CARE_PROVIDER_SITE_OTHER)
Admission: EM | Admit: 2013-05-15 | Discharge: 2013-05-15 | Disposition: A | Discharge status: Home / Self Care | Payer: Self-pay | Source: Home / Self Care | Attending: Emergency Medicine | Admitting: Emergency Medicine

## 2013-05-15 DIAGNOSIS — J069 Acute upper respiratory infection, unspecified: Secondary | ICD-10-CM

## 2013-05-15 MED ORDER — AMOXICILLIN 500 MG PO CAPS: 1000.0000 mg | ORAL_CAPSULE | Freq: Three times a day (TID) | ORAL | Status: DC

## 2013-05-15 NOTE — ED Provider Notes (Signed)
Chief Complaint:   Chief Complaint  Patient presents with  . Cough    History of Present Illness:   Michele Hayden is a 22 year old female who is 6 months pregnant. She's had a two-day history of upper respiratory symptoms. She describes nasal congestion with clear rhinorrhea, sinus pressure, pressure in her eyes, ear pressure, aches, chills, scratchy throat, and nausea. She denies fever, headache, coughing, vomiting, or diarrhea. 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 She is 6 months pregnant. There've been no pregnancy complications. This is her first pregnancy.  Review of Systems:  Other than noted above, the patient denies any of the following symptoms: Systemic:  No fevers, chills, sweats, weight loss or gain, fatigue, or tiredness. Eye:  No redness or discharge. ENT:  No ear pain, drainage, headache, nasal congestion, drainage, sinus pressure, difficulty swallowing, or sore throat. Neck:  No neck pain or swollen glands. Lungs:  No cough, sputum production, hemoptysis, wheezing, chest tightness, shortness of breath or chest pain. GI:  No abdominal pain, nausea, vomiting or diarrhea.  PMFSH:  Past medical history, family history, social history, meds, and allergies were reviewed.  Physical Exam:   Vital signs:  BP 117/70  Pulse 83  Temp(Src) 98.1 F (36.7 C) (Oral)  Resp 16  SpO2 100%  LMP 01/09/2013 General:  Alert and oriented.  In no distress.  Skin warm and dry. Eye:  No conjunctival injection or drainage. Lids were normal. ENT:  TMs and canals were normal, without erythema or inflammation.  Nasal mucosa was congested with clear drainage.  Mucous membranes were moist.  Pharynx was clear with no exudate or drainage.  There were no oral ulcerations or lesions. Neck:  Supple, no adenopathy, tenderness or mass. Lungs:  No respiratory distress.  Lungs were clear to auscultation, without wheezes, rales or rhonchi.  Breath sounds were  clear and equal bilaterally.  Heart:  Regular rhythm, without gallops, murmers or rubs. Skin:  Clear, warm, and dry, without rash or lesions.  Assessment:  The encounter diagnosis was Viral upper respiratory infection.  At this point in her pregnancy she is safe to take decongestants, so suggested Claritin-D, Allegra-D, or Zyrtec-D. She may use nasal sprays, hot compresses to the face, inhaling steam, or saline nasal washes. She was given a printed prescription for amoxicillin, but told her not to get this filled unless symptoms had not resolved within a week.  Plan:   1.  Meds:  The following meds were prescribed:   Discharge Medication List as of 05/15/2013  7:04 PM    START taking these medications   Details  amoxicillin (AMOXIL) 500 MG capsule Take 2 capsules (1,000 mg total) by mouth 3 (three) times daily., Starting 05/15/2013, Until Discontinued, Print        2.  Patient Education/Counseling:  The patient was given appropriate handouts, self care instructions, and instructed in symptomatic relief.    3.  Follow up:  The patient was told to follow up if no better in 3 to 4 days, if becoming worse in any way, and given some red flag symptoms such as fever or difficulty breathing which would prompt immediate return.  Follow up here as necessary.      Reuben Likes, MD 05/15/13 2200

## 2013-05-15 NOTE — ED Notes (Signed)
Pt c/o cough & congestion x 2 days. Denies V/D - some nausea.

## 2013-05-15 NOTE — ED Notes (Signed)
Bed: UC10 Expected date:  Expected time:  Means of arrival:  Comments: NIX'd - open at 5:25

## 2013-05-22 ENCOUNTER — Ambulatory Visit (INDEPENDENT_AMBULATORY_CARE_PROVIDER_SITE_OTHER): Payer: Self-pay | Admitting: Obstetrics & Gynecology

## 2013-05-22 VITALS — BP 124/71 | Wt 243.0 lb

## 2013-05-22 DIAGNOSIS — Z3480 Encounter for supervision of other normal pregnancy, unspecified trimester: Secondary | ICD-10-CM

## 2013-05-22 DIAGNOSIS — O99891 Other specified diseases and conditions complicating pregnancy: Secondary | ICD-10-CM

## 2013-05-22 DIAGNOSIS — Z3492 Encounter for supervision of normal pregnancy, unspecified, second trimester: Secondary | ICD-10-CM

## 2013-05-22 DIAGNOSIS — Z0489 Encounter for examination and observation for other specified reasons: Secondary | ICD-10-CM

## 2013-05-22 DIAGNOSIS — IMO0002 Reserved for concepts with insufficient information to code with codable children: Secondary | ICD-10-CM

## 2013-05-22 DIAGNOSIS — A568 Sexually transmitted chlamydial infection of other sites: Secondary | ICD-10-CM

## 2013-05-22 DIAGNOSIS — O99712 Diseases of the skin and subcutaneous tissue complicating pregnancy, second trimester: Secondary | ICD-10-CM

## 2013-05-22 DIAGNOSIS — O98319 Other infections with a predominantly sexual mode of transmission complicating pregnancy, unspecified trimester: Secondary | ICD-10-CM

## 2013-05-22 DIAGNOSIS — Z363 Encounter for antenatal screening for malformations: Secondary | ICD-10-CM

## 2013-05-22 DIAGNOSIS — A749 Chlamydial infection, unspecified: Secondary | ICD-10-CM

## 2013-05-22 DIAGNOSIS — Z1389 Encounter for screening for other disorder: Secondary | ICD-10-CM

## 2013-05-22 NOTE — Progress Notes (Signed)
P = 92 

## 2013-05-22 NOTE — Progress Notes (Signed)
Treated for Chlamydia infection on 04/12/13, TOC culture done today.  Patient reports that the hypopigmented macular spots on chest are getting bigger and more prominent in pregnancy, desires dermatology evaluation. No itching, erythema or other concerning signs.  Regular borders.  Likely exaggeration of pigmentation problems in pregnancy.   Third trimester labs next visit.  Counseled about Tdap, will decide by next visit.   No other complaints or concerns.  Routine obstetric precautions reviewed.

## 2013-05-22 NOTE — Patient Instructions (Addendum)
Return to clinic for any obstetric concerns or go to [facility] for evaluation Tetanus, Diphtheria, Pertussis (Tdap) Vaccine What You Need to Know WHY GET VACCINATED? Tetanus, diphtheria and pertussis can be very serious diseases, even for adolescents and adults. Tdap vaccine can protect us from these diseases. TETANUS (Lockjaw) causes painful muscle tightening and stiffness, usually all over the body.  It can lead to tightening of muscles in the head and neck so you can't open your mouth, swallow, or sometimes even breathe. Tetanus kills about 1 out of 5 people who are infected. DIPHTHERIA can cause a thick coating to form in the back of the throat.  It can lead to breathing problems, paralysis, heart failure, and death. PERTUSSIS (Whooping Cough) causes severe coughing spells, which can cause difficulty breathing, vomiting and disturbed sleep.  It can also lead to weight loss, incontinence, and rib fractures. Up to 2 in 100 adolescents and 5 in 100 adults with pertussis are hospitalized or have complications, which could include pneumonia and death. These diseases are caused by bacteria. Diphtheria and pertussis are spread from person to person through coughing or sneezing. Tetanus enters the body through cuts, scratches, or wounds. Before vaccines, the [place] saw as many as 200,000 cases a year of diphtheria and pertussis, and hundreds of cases of tetanus. Since vaccination began, tetanus and diphtheria have dropped by about 99% and pertussis by about 80%. TDAP VACCINE Tdap vaccine can protect adolescents and adults from tetanus, diphtheria, and pertussis. One dose of Tdap is routinely given at age 11 or 12. People who did not get Tdap at that age should get it as soon as possible. Tdap is especially important for health care professionals and anyone having close contact with a baby younger than 12 months. Pregnant women should get a dose of Tdap during every pregnancy, to protect the newborn  from pertussis. Infants are most at risk for severe, life-threatening complications from pertussis. A similar vaccine, called Td, protects from tetanus and diphtheria, but not pertussis. A Td booster should be given every 10 years. Tdap may be given as one of these boosters if you have not already gotten a dose. Tdap may also be given after a severe cut or burn to prevent tetanus infection. Your doctor can give you more information. Tdap may safely be given at the same time as other vaccines. SOME PEOPLE SHOULD NOT GET THIS VACCINE  If you ever had a life-threatening allergic reaction after a dose of any tetanus, diphtheria, or pertussis containing vaccine, OR if you have a severe allergy to any part of this vaccine, you should not get Tdap. Tell your doctor if you have any severe allergies.  If you had a coma, or long or multiple seizures within 7 days after a childhood dose of DTP or DTaP, you should not get Tdap, unless a cause other than the vaccine was found. You can still get Td.  Talk to your doctor if you:  have epilepsy or another nervous system problem,  had severe pain or swelling after any vaccine containing diphtheria, tetanus or pertussis,  ever had Guillain-Barr Syndrome (GBS),  aren't feeling well on the day the shot is scheduled. RISKS OF A VACCINE REACTION With any medicine, including vaccines, there is a chance of side effects. These are usually mild and go away on their own, but serious reactions are also possible. Brief fainting spells can follow a vaccination, leading to injuries from falling. Sitting or lying down for about 15 minutes can   help prevent these. Tell your doctor if you feel dizzy or light-headed, or have vision changes or ringing in the ears. Mild problems following Tdap (Did not interfere with activities)  Pain where the shot was given (about 3 in 4 adolescents or 2 in 3 adults)  Redness or swelling where the shot was given (about 1 person in 5)  Mild  fever of at least 100.4F (up to about 1 in 25 adolescents or 1 in 100 adults)  Headache (about 3 or 4 people in 10)  Tiredness (about 1 person in 3 or 4)  Nausea, vomiting, diarrhea, stomach ache (up to 1 in 4 adolescents or 1 in 10 adults)  Chills, body aches, sore joints, rash, swollen glands (uncommon) Moderate problems following Tdap (Interfered with activities, but did not require medical attention)  Pain where the shot was given (about 1 in 5 adolescents or 1 in 100 adults)  Redness or swelling where the shot was given (up to about 1 in 16 adolescents or 1 in 25 adults)  Fever over 102F (about 1 in 100 adolescents or 1 in 250 adults)  Headache (about 3 in 20 adolescents or 1 in 10 adults)  Nausea, vomiting, diarrhea, stomach ache (up to 1 or 3 people in 100)  Swelling of the entire arm where the shot was given (up to about 3 in 100). Severe problems following Tdap (Unable to perform usual activities, required medical attention)  Swelling, severe pain, bleeding and redness in the arm where the shot was given (rare). A severe allergic reaction could occur after any vaccine (estimated less than 1 in a million doses). WHAT IF THERE IS A SERIOUS REACTION? What should I look for?  Look for anything that concerns you, such as signs of a severe allergic reaction, very high fever, or behavior changes. Signs of a severe allergic reaction can include hives, swelling of the face and throat, difficulty breathing, a fast heartbeat, dizziness, and weakness. These would start a few minutes to a few hours after the vaccination. What should I do?  If you think it is a severe allergic reaction or other emergency that can't wait, call 9-1-1 or get the person to the nearest hospital. Otherwise, call your doctor.  Afterward, the reaction should be reported to the "Vaccine Adverse Event Reporting System" (VAERS). Your doctor might file this report, or you can do it yourself through the VAERS web  site at www.vaers.hhs.gov, or by calling 1-800-822-7967. VAERS is only for reporting reactions. They do not give medical advice.  THE NATIONAL VACCINE INJURY COMPENSATION PROGRAM The National Vaccine Injury Compensation Program (VICP) is a federal program that was created to compensate people who may have been injured by certain vaccines. Persons who believe they may have been injured by a vaccine can learn about the program and about filing a claim by calling 1-800-338-2382 or visiting the VICP website at www.hrsa.gov/vaccinecompensation. HOW CAN I LEARN MORE?  Ask your doctor.  Call your local or state health department.  Contact the Centers for Disease Control and Prevention (CDC):  Call 1-800-232-4636 or visit CDC's website at www.cdc.gov/vaccines. CDC Tdap Vaccine VIS (11/04/11) Document Released: 12/14/2011 Document Revised: 10/09/2012 Document Reviewed: 10/04/2012 ExitCare Patient Information 2014 [employer].  

## 2013-05-23 ENCOUNTER — Encounter: Payer: Self-pay | Admitting: Obstetrics & Gynecology

## 2013-05-23 LAB — GC/CHLAMYDIA PROBE AMP: GC Probe RNA: NEGATIVE

## 2013-05-23 MED ORDER — AZITHROMYCIN 500 MG PO TABS: 1000.0000 mg | ORAL_TABLET | Freq: Once | ORAL | Status: DC

## 2013-05-23 NOTE — Addendum Note (Signed)
Addended by: Jaynie Collins A on: 05/23/2013 10:13 PM   Modules accepted: Orders

## 2013-05-23 NOTE — Progress Notes (Signed)
TOC positive for CT again, Azithromycin represcribed.   Patient will be advised to have her partner get treatment; she may be getting reinfected.  No intercourse until her partner receives treatment.

## 2013-05-31 ENCOUNTER — Other Ambulatory Visit: Payer: Self-pay | Admitting: Obstetrics & Gynecology

## 2013-05-31 ENCOUNTER — Other Ambulatory Visit: Payer: Self-pay | Admitting: Obstetrics and Gynecology

## 2013-05-31 ENCOUNTER — Ambulatory Visit (HOSPITAL_COMMUNITY)
Admission: RE | Admit: 2013-05-31 | Discharge: 2013-05-31 | Disposition: A | Discharge status: Home / Self Care | Payer: Medicaid Other | Source: Ambulatory Visit | Attending: Obstetrics and Gynecology | Admitting: Obstetrics and Gynecology

## 2013-05-31 DIAGNOSIS — Z3492 Encounter for supervision of normal pregnancy, unspecified, second trimester: Secondary | ICD-10-CM

## 2013-05-31 DIAGNOSIS — E669 Obesity, unspecified: Secondary | ICD-10-CM | POA: Insufficient documentation

## 2013-05-31 DIAGNOSIS — IMO0002 Reserved for concepts with insufficient information to code with codable children: Secondary | ICD-10-CM

## 2013-05-31 DIAGNOSIS — Z0489 Encounter for examination and observation for other specified reasons: Secondary | ICD-10-CM

## 2013-05-31 DIAGNOSIS — Z3689 Encounter for other specified antenatal screening: Secondary | ICD-10-CM | POA: Insufficient documentation

## 2013-05-31 MED ORDER — PROGESTERONE MICRONIZED 200 MG PO CAPS: ORAL_CAPSULE | ORAL | Status: DC

## 2013-05-31 NOTE — Progress Notes (Signed)
Attempted to call patient and mailbox was not set up to be able to leave messages.

## 2013-06-02 ENCOUNTER — Encounter: Payer: Self-pay | Admitting: Obstetrics & Gynecology

## 2013-06-02 DIAGNOSIS — O09299 Supervision of pregnancy with other poor reproductive or obstetric history, unspecified trimester: Secondary | ICD-10-CM | POA: Insufficient documentation

## 2013-06-15 ENCOUNTER — Encounter: Payer: Self-pay | Admitting: Family Medicine

## 2013-06-28 NOTE — L&D Delivery Note (Signed)
Attestation of Attending Supervision of Advanced Practitioner (PA/CNM/NP): Evaluation and management procedures were performed by the Advanced Practitioner under my supervision and collaboration.  I have reviewed the Advanced Practitioner's note and chart, and I agree with the management and plan.  Bethany Cumming, MD, FACOG Attending Obstetrician & Gynecologist Faculty Practice, [facility]  

## 2013-06-28 NOTE — L&D Delivery Note (Signed)
Delivery Note At 12:45 PM a viable female was delivered via Vaginal, Spontaneous Delivery (Presentation: Left Occiput Anterior).  APGAR: 8, 9; weight 7 lb 7.1 oz (3375 g).   Placenta status: Intact, Spontaneous.  Cord: 3 vessels with the following complications: Short.    Anesthesia: Epidural  Episiotomy: None Lacerations: Perineal;partial 3rd degree Suture Repair: 3.0 vicryl rapide Est. Blood Loss (mL): 300  Mom to postpartum.  Baby to Couplet care / Skin to Skin.  Edin Skarda 09/18/2013, 1:41 PM PA-S and Resident did delivery. I did repair.

## 2013-07-04 ENCOUNTER — Encounter: Payer: No Typology Code available for payment source | Admitting: Obstetrics & Gynecology

## 2013-07-05 ENCOUNTER — Encounter: Payer: No Typology Code available for payment source | Admitting: Obstetrics & Gynecology

## 2013-07-06 ENCOUNTER — Ambulatory Visit: Payer: Medicaid Other | Admitting: Obstetrics and Gynecology

## 2013-07-06 ENCOUNTER — Encounter: Payer: Self-pay | Admitting: Obstetrics and Gynecology

## 2013-07-07 ENCOUNTER — Encounter (HOSPITAL_COMMUNITY): Payer: Self-pay | Admitting: Emergency Medicine

## 2013-07-07 ENCOUNTER — Emergency Department (INDEPENDENT_AMBULATORY_CARE_PROVIDER_SITE_OTHER)
Admission: EM | Admit: 2013-07-07 | Discharge: 2013-07-07 | Disposition: A | Discharge status: Home / Self Care | Payer: Medicaid Other | Source: Home / Self Care | Attending: Family Medicine | Admitting: Family Medicine

## 2013-07-07 DIAGNOSIS — O99214 Obesity complicating childbirth: Secondary | ICD-10-CM

## 2013-07-07 DIAGNOSIS — E669 Obesity, unspecified: Secondary | ICD-10-CM

## 2013-07-07 DIAGNOSIS — A084 Viral intestinal infection, unspecified: Secondary | ICD-10-CM

## 2013-07-07 DIAGNOSIS — A088 Other specified intestinal infections: Secondary | ICD-10-CM

## 2013-07-07 DIAGNOSIS — O99213 Obesity complicating pregnancy, third trimester: Secondary | ICD-10-CM

## 2013-07-07 MED ORDER — ONDANSETRON 4 MG PO TBDP
8.0000 mg | ORAL_TABLET | Freq: Once | ORAL | Status: AC
  Administered 2013-07-07: 8 mg via ORAL

## 2013-07-07 MED ORDER — ONDANSETRON HCL 8 MG PO TABS: 8.0000 mg | ORAL_TABLET | Freq: Three times a day (TID) | ORAL | Status: DC | PRN

## 2013-07-07 MED ORDER — ONDANSETRON 4 MG PO TBDP
ORAL_TABLET | ORAL | Status: AC
  Filled 2013-07-07: qty 2

## 2013-07-07 NOTE — ED Provider Notes (Signed)
Michele Hayden is a 23 y.o. female who presents to Urgent Care today for 2 days of nausea vomiting and diarrhea. Patient also notes a mild headache consistent with her typical headache. She denies any blood in her vomit or stool. She denies any melena. She is about 7 months pregnant and currently doing well. No trouble breathing. She notes multiple sick contacts at work with a similar viral illness.   Past Medical History  Diagnosis Date  . Chlamydia infection complicating pregnancy in second trimester 04/10/2013   History  Substance Use Topics  . Smoking status: Current Some Day Smoker    Types: Cigarettes  . Smokeless tobacco: Never Used  . Alcohol Use: No   ROS as above Medications reviewed. No current facility-administered medications for this encounter.   Current Outpatient Prescriptions  Medication Sig Dispense Refill  . Prenatal Vit-Fe Fumarate-FA (MULTIVITAMIN-PRENATAL) 27-0.8 MG TABS tablet Take 1 tablet by mouth daily at 12 noon.      Marland Kitchen. acetaminophen (TYLENOL) 325 MG tablet Take 650 mg by mouth every 6 (six) hours as needed for pain.      Marland Kitchen. ondansetron (ZOFRAN) 8 MG tablet Take 1 tablet (8 mg total) by mouth every 8 (eight) hours as needed for nausea or vomiting.  20 tablet  0  . progesterone (PROMETRIUM) 200 MG capsule 1 capsule per vagina at bedtime q HS  30 capsule  4  . pseudoephedrine (SUDAFED) 30 MG tablet Take 30 mg by mouth every 4 (four) hours as needed for congestion.        Exam:  BP 136/78  Pulse 80  Temp(Src) 98.1 F (36.7 C) (Oral)  Resp 16  SpO2 98%  LMP 01/09/2013 Gen: Well NAD HEENT: EOMI,  MMM Lungs: Normal work of breathing. CTABL Heart: RRR no MRG Abd: NABS, . NT, ND no rebound or guarding. Exts: Non edematous BL  LE, warm and well perfused.  Neuro: Alert and oriented normally coordinated normal gait.   Assessment and Plan: 23 y.o. female with viral gastroenteritis. Plan to treat with Zofran. Additionally recommend followup with OB/GYN.    Discussed warning signs or symptoms. Please see discharge instructions. Patient expresses understanding.    Rodolph BongEvan S Vidit Boissonneault, MD 07/07/13 [phone]

## 2013-07-07 NOTE — Discharge Instructions (Signed)
Thank you for coming in today. Zofran as needed for vomiting. Use Tylenol as needed for headache or pain or fever. Followup with OB/GYN. If your belly pain worsens, or you have high fever, bad vomiting, blood in your stool or black tarry stool go to the Emergency Room.  Go to the emergency room if your headache becomes excruciating or you have weakness or numbness or uncontrolled vomiting.

## 2013-07-07 NOTE — ED Notes (Signed)
Pt c/o feeling nauseas onset 2 days w/sxs that include: HA, v/d Reports she is 7 months preg Denies: fevers... She is alert w/no signs of acute distress.

## 2013-07-09 NOTE — Progress Notes (Signed)
Patient did not come in for her 1/9 appointment. She will be contacted to reschedule

## 2013-07-13 ENCOUNTER — Encounter: Payer: No Typology Code available for payment source | Admitting: Family Medicine

## 2013-07-13 ENCOUNTER — Encounter: Payer: Self-pay | Admitting: Family Medicine

## 2013-07-13 ENCOUNTER — Ambulatory Visit (INDEPENDENT_AMBULATORY_CARE_PROVIDER_SITE_OTHER): Payer: Medicaid Other | Admitting: Family Medicine

## 2013-07-13 VITALS — BP 128/79 | Wt 247.8 lb

## 2013-07-13 DIAGNOSIS — Z3480 Encounter for supervision of other normal pregnancy, unspecified trimester: Secondary | ICD-10-CM

## 2013-07-13 DIAGNOSIS — A749 Chlamydial infection, unspecified: Secondary | ICD-10-CM

## 2013-07-13 MED ORDER — AZITHROMYCIN 500 MG PO TABS: ORAL_TABLET | ORAL | Status: DC

## 2013-07-13 NOTE — Patient Instructions (Signed)
Third Trimester of Pregnancy The third trimester is from week 29 through week 42, months 7 through 9. The third trimester is a time when the fetus is growing rapidly. At the end of the ninth month, the fetus is about 20 inches in length and weighs 6 10 pounds.  BODY CHANGES Your body goes through many changes during pregnancy. The changes vary from woman to woman.   Your weight will continue to increase. You can expect to gain 25 35 pounds (11 16 kg) by the end of the pregnancy.  You may begin to get stretch marks on your hips, abdomen, and breasts.  You may urinate more often because the fetus is moving lower into your pelvis and pressing on your bladder.  You may develop or continue to have heartburn as a result of your pregnancy.  You may develop constipation because certain hormones are causing the muscles that push waste through your intestines to slow down.  You may develop hemorrhoids or swollen, bulging veins (varicose veins).  You may have pelvic pain because of the weight gain and pregnancy hormones relaxing your joints between the bones in your pelvis. Back aches may result from over exertion of the muscles supporting your posture.  Your breasts will continue to grow and be tender. A yellow discharge may leak from your breasts called colostrum.  Your belly button may stick out.  You may feel short of breath because of your expanding uterus.  You may notice the fetus "dropping," or moving lower in your abdomen.  You may have a bloody mucus discharge. This usually occurs a few days to a week before labor begins.  Your cervix becomes thin and soft (effaced) near your due date. WHAT TO EXPECT AT YOUR PRENATAL EXAMS  You will have prenatal exams every 2 weeks until week 36. Then, you will have weekly prenatal exams. During a routine prenatal visit:  You will be weighed to make sure you and the fetus are growing normally.  Your blood pressure is taken.  Your abdomen will  be measured to track your baby's growth.  The fetal heartbeat will be listened to.  Any test results from the previous visit will be discussed.  You may have a cervical check near your due date to see if you have effaced. At around 36 weeks, your caregiver will check your cervix. At the same time, your caregiver will also perform a test on the secretions of the vaginal tissue. This test is to determine if a type of bacteria, Group B streptococcus, is present. Your caregiver will explain this further. Your caregiver may ask you:  What your birth plan is.  How you are feeling.  If you are feeling the baby move.  If you have had any abnormal symptoms, such as leaking fluid, bleeding, severe headaches, or abdominal cramping.  If you have any questions. Other tests or screenings that may be performed during your third trimester include:  Blood tests that check for low iron levels (anemia).  Fetal testing to check the health, activity level, and growth of the fetus. Testing is done if you have certain medical conditions or if there are problems during the pregnancy. FALSE LABOR You may feel small, irregular contractions that eventually go away. These are called Braxton Hicks contractions, or false labor. Contractions may last for hours, days, or even weeks before true labor sets in. If contractions come at regular intervals, intensify, or become painful, it is best to be seen by your caregiver.    SIGNS OF LABOR   Menstrual-like cramps.  Contractions that are 5 minutes apart or less.  Contractions that start on the top of the uterus and spread down to the lower abdomen and back.  A sense of increased pelvic pressure or back pain.  A watery or bloody mucus discharge that comes from the vagina. If you have any of these signs before the 37th week of pregnancy, call your caregiver right away. You need to go to the hospital to get checked immediately. HOME CARE INSTRUCTIONS   Avoid all  smoking, herbs, alcohol, and unprescribed drugs. These chemicals affect the formation and growth of the baby.  Follow your caregiver's instructions regarding medicine use. There are medicines that are either safe or unsafe to take during pregnancy.  Exercise only as directed by your caregiver. Experiencing uterine cramps is a good sign to stop exercising.  Continue to eat regular, healthy meals.  Wear a good support bra for breast tenderness.  Do not use hot tubs, steam rooms, or saunas.  Wear your seat belt at all times when driving.  Avoid raw meat, uncooked cheese, cat litter boxes, and soil used by cats. These carry germs that can cause birth defects in the baby.  Take your prenatal vitamins.  Try taking a stool softener (if your caregiver approves) if you develop constipation. Eat more high-fiber foods, such as fresh vegetables or fruit and whole grains. Drink plenty of fluids to keep your urine clear or pale yellow.  Take warm sitz baths to soothe any pain or discomfort caused by hemorrhoids. Use hemorrhoid cream if your caregiver approves.  If you develop varicose veins, wear support hose. Elevate your feet for 15 minutes, 3 4 times a day. Limit salt in your diet.  Avoid heavy lifting, wear low heal shoes, and practice good posture.  Rest a lot with your legs elevated if you have leg cramps or low back pain.  Visit your dentist if you have not gone during your pregnancy. Use a soft toothbrush to brush your teeth and be gentle when you floss.  A sexual relationship may be continued unless your caregiver directs you otherwise.  Do not travel far distances unless it is absolutely necessary and only with the approval of your caregiver.  Take prenatal classes to understand, practice, and ask questions about the labor and delivery.  Make a trial run to the hospital.  Pack your hospital bag.  Prepare the baby's nursery.  Continue to go to all your prenatal visits as directed  by your caregiver. SEEK MEDICAL CARE IF:  You are unsure if you are in labor or if your water has broken.  You have dizziness.  You have mild pelvic cramps, pelvic pressure, or nagging pain in your abdominal area.  You have persistent nausea, vomiting, or diarrhea.  You have a bad smelling vaginal discharge.  You have pain with urination. SEEK IMMEDIATE MEDICAL CARE IF:   You have a fever.  You are leaking fluid from your vagina.  You have spotting or bleeding from your vagina.  You have severe abdominal cramping or pain.  You have rapid weight loss or gain.  You have shortness of breath with chest pain.  You notice sudden or extreme swelling of your face, hands, ankles, feet, or legs.  You have not felt your baby move in over an hour.  You have severe headaches that do not go away with medicine.  You have vision changes. Document Released: 06/08/2001 Document Revised: 02/14/2013 Document Reviewed:   08/15/2012 [employer] Patient Information 2014 [employer].  Breastfeeding Deciding to breastfeed is one of the best choices you can make for you and your baby. A change in hormones during pregnancy causes your breast tissue to grow and increases the number and size of your milk ducts. These hormones also allow proteins, sugars, and fats from your blood supply to make breast milk in your milk-producing glands. Hormones prevent breast milk from being released before your baby is born as well as prompt milk flow after birth. Once breastfeeding has begun, thoughts of your baby, as well as his or her sucking or crying, can stimulate the release of milk from your milk-producing glands.  BENEFITS OF BREASTFEEDING For Your Baby  Your first milk (colostrum) helps your baby's digestive system function better.   There are antibodies in your milk that help your baby fight off infections.   Your baby has a lower incidence of asthma, allergies, and sudden infant death syndrome.    The nutrients in breast milk are better for your baby than infant formulas and are designed uniquely for your baby's needs.   Breast milk improves your baby's brain development.   Your baby is less likely to develop other conditions, such as childhood obesity, asthma, or type 2 diabetes mellitus.  For You   Breastfeeding helps to create a very special bond between you and your baby.   Breastfeeding is convenient. Breast milk is always available at the correct temperature and costs nothing.   Breastfeeding helps to burn calories and helps you lose the weight gained during pregnancy.   Breastfeeding makes your uterus contract to its prepregnancy size faster and slows bleeding (lochia) after you give birth.   Breastfeeding helps to lower your risk of developing type 2 diabetes mellitus, osteoporosis, and breast or ovarian cancer later in life. SIGNS THAT YOUR BABY IS HUNGRY Early Signs of Hunger  Increased alertness or activity.  Stretching.  Movement of the head from side to side.  Movement of the head and opening of the mouth when the corner of the mouth or cheek is stroked (rooting).  Increased sucking sounds, smacking lips, cooing, sighing, or squeaking.  Hand-to-mouth movements.  Increased sucking of fingers or hands. Late Signs of Hunger  Fussing.  Intermittent crying. Extreme Signs of Hunger Signs of extreme hunger will require calming and consoling before your baby will be able to breastfeed successfully. Do not wait for the following signs of extreme hunger to occur before you initiate breastfeeding:   Restlessness.  A loud, strong cry.   Screaming. BREASTFEEDING BASICS Breastfeeding Initiation  Find a comfortable place to sit or lie down, with your neck and back well supported.  Place a pillow or rolled up blanket under your baby to bring him or her to the level of your breast (if you are seated). Nursing pillows are specially designed to help  support your arms and your baby while you breastfeed.  Make sure that your baby's abdomen is facing your abdomen.   Gently massage your breast. With your fingertips, massage from your chest wall toward your nipple in a circular motion. This encourages milk flow. You may need to continue this action during the feeding if your milk flows slowly.  Support your breast with 4 fingers underneath and your thumb above your nipple. Make sure your fingers are well away from your nipple and your baby's mouth.   Stroke your baby's lips gently with your finger or nipple.   When your baby's mouth is   open wide enough, quickly bring your baby to your breast, placing your entire nipple and as much of the colored area around your nipple (areola) as possible into your baby's mouth.   More areola should be visible above your baby's upper lip than below the lower lip.   Your baby's tongue should be between his or her lower gum and your breast.   Ensure that your baby's mouth is correctly positioned around your nipple (latched). Your baby's lips should create a seal on your breast and be turned out (everted).  It is common for your baby to suck about 2 3 minutes in order to start the flow of breast milk. Latching Teaching your baby how to latch on to your breast properly is very important. An improper latch can cause nipple pain and decreased milk supply for you and poor weight gain in your baby. Also, if your baby is not latched onto your nipple properly, he or she may swallow some air during feeding. This can make your baby fussy. Burping your baby when you switch breasts during the feeding can help to get rid of the air. However, teaching your baby to latch on properly is still the best way to prevent fussiness from swallowing air while breastfeeding. Signs that your baby has successfully latched on to your nipple:    Silent tugging or silent sucking, without causing you pain.   Swallowing heard  between every 3 4 sucks.    Muscle movement above and in front of his or her ears while sucking.  Signs that your baby has not successfully latched on to nipple:   Sucking sounds or smacking sounds from your baby while breastfeeding.  Nipple pain. If you think your baby has not latched on correctly, slip your finger into the corner of your baby's mouth to break the suction and place it between your baby's gums. Attempt breastfeeding initiation again. Signs of Successful Breastfeeding Signs from your baby:   A gradual decrease in the number of sucks or complete cessation of sucking.   Falling asleep.   Relaxation of his or her body.   Retention of a small amount of milk in his or her mouth.   Letting go of your breast by himself or herself. Signs from you:  Breasts that have increased in firmness, weight, and size 1 3 hours after feeding.   Breasts that are softer immediately after breastfeeding.  Increased milk volume, as well as a change in milk consistency and color by the 5th day of breastfeeding.   Nipples that are not sore, cracked, or bleeding. Signs That Your Baby is Getting Enough Milk  Wetting at least 3 diapers in a 24-hour period. The urine should be clear and pale yellow by age 5 days.  At least 3 stools in a 24-hour period by age 5 days. The stool should be soft and yellow.  At least 3 stools in a 24-hour period by age 7 days. The stool should be seedy and yellow.  No loss of weight greater than 10% of birth weight during the first 3 days of age.  Average weight gain of 4 7 ounces (120 210 mL) per week after age 4 days.  Consistent daily weight gain by age 5 days, without weight loss after the age of 2 weeks. After a feeding, your baby may spit up a small amount. This is common. BREASTFEEDING FREQUENCY AND DURATION Frequent feeding will help you make more milk and can prevent sore nipples and breast engorgement.   Breastfeed when you feel the need to  reduce the fullness of your breasts or when your baby shows signs of hunger. This is called "breastfeeding on demand." Avoid introducing a pacifier to your baby while you are working to establish breastfeeding (the first 4 6 weeks after your baby is born). After this time you may choose to use a pacifier. Research has shown that pacifier use during the first year of a baby's life decreases the risk of sudden infant death syndrome (SIDS). Allow your baby to feed on each breast as long as he or she wants. Breastfeed until your baby is finished feeding. When your baby unlatches or falls asleep while feeding from the first breast, offer the second breast. Because newborns are often sleepy in the first few weeks of life, you may need to awaken your baby to get him or her to feed. Breastfeeding times will vary from baby to baby. However, the following rules can serve as a guide to help you ensure that your baby is properly fed:  Newborns (babies 4 weeks of age or younger) may breastfeed every 1 3 hours.  Newborns should not go longer than 3 hours during the day or 5 hours during the night without breastfeeding.  You should breastfeed your baby a minimum of 8 times in a 24-hour period until you begin to introduce solid foods to your baby at around 6 months of age. BREAST MILK PUMPING Pumping and storing breast milk allows you to ensure that your baby is exclusively fed your breast milk, even at times when you are unable to breastfeed. This is especially important if you are going back to work while you are still breastfeeding or when you are not able to be present during feedings. Your lactation consultant can give you guidelines on how long it is safe to store breast milk.  A breast pump is a machine that allows you to pump milk from your breast into a sterile bottle. The pumped breast milk can then be stored in a refrigerator or freezer. Some breast pumps are operated by hand, while others use electricity. Ask  your lactation consultant which type will work best for you. Breast pumps can be purchased, but some hospitals and breastfeeding support groups lease breast pumps on a monthly basis. A lactation consultant can teach you how to hand express breast milk, if you prefer not to use a pump.  CARING FOR YOUR BREASTS WHILE YOU BREASTFEED Nipples can become dry, cracked, and sore while breastfeeding. The following recommendations can help keep your breasts moisturized and healthy:  Avoid using soap on your nipples.   Wear a supportive bra. Although not required, special nursing bras and tank tops are designed to allow access to your breasts for breastfeeding without taking off your entire bra or top. Avoid wearing underwire style bras or extremely tight bras.  Air dry your nipples for 3 4minutes after each feeding.   Use only cotton bra pads to absorb leaked breast milk. Leaking of breast milk between feedings is normal.   Use lanolin on your nipples after breastfeeding. Lanolin helps to maintain your skin's normal moisture barrier. If you use pure lanolin you do not need to wash it off before feeding your baby again. Pure lanolin is not toxic to your baby. You may also hand express a few drops of breast milk and gently massage that milk into your nipples and allow the milk to air dry. In the first few weeks after giving birth, some women   experience extremely full breasts (engorgement). Engorgement can make your breasts feel heavy, warm, and tender to the touch. Engorgement peaks within 3 5 days after you give birth. The following recommendations can help ease engorgement:  Completely empty your breasts while breastfeeding or pumping. You may want to start by applying warm, moist heat (in the shower or with warm water-soaked hand towels) just before feeding or pumping. This increases circulation and helps the milk flow. If your baby does not completely empty your breasts while breastfeeding, pump any extra  milk after he or she is finished.  Wear a snug bra (nursing or regular) or tank top for 1 2 days to signal your body to slightly decrease milk production.  Apply ice packs to your breasts, unless this is too uncomfortable for you.  Make sure that your baby is latched on and positioned properly while breastfeeding. If engorgement persists after 48 hours of following these recommendations, contact your health care provider or a lactation consultant. OVERALL HEALTH CARE RECOMMENDATIONS WHILE BREASTFEEDING  Eat healthy foods. Alternate between meals and snacks, eating 3 of each per day. Because what you eat affects your breast milk, some of the foods may make your baby more irritable than usual. Avoid eating these foods if you are sure that they are negatively affecting your baby.  Drink milk, fruit juice, and water to satisfy your thirst (about 10 glasses a day).   Rest often, relax, and continue to take your prenatal vitamins to prevent fatigue, stress, and anemia.  Continue breast self-awareness checks.  Avoid chewing and smoking tobacco.  Avoid alcohol and drug use. Some medicines that may be harmful to your baby can pass through breast milk. It is important to ask your health care provider before taking any medicine, including all over-the-counter and prescription medicine as well as vitamin and herbal supplements. It is possible to become pregnant while breastfeeding. If birth control is desired, ask your health care provider about options that will be safe for your baby. SEEK MEDICAL CARE IF:   You feel like you want to stop breastfeeding or have become frustrated with breastfeeding.  You have painful breasts or nipples.  Your nipples are cracked or bleeding.  Your breasts are red, tender, or warm.  You have a swollen area on either breast.  You have a fever or chills.  You have nausea or vomiting.  You have drainage other than breast milk from your nipples.  Your breasts  do not become full before feedings by the 5th day after you give birth.  You feel sad and depressed.  Your baby is too sleepy to eat well.  Your baby is having trouble sleeping.   Your baby is wetting less than 3 diapers in a 24-hour period.  Your baby has less than 3 stools in a 24-hour period.  Your baby's skin or the white part of his or her eyes becomes yellow.   Your baby is not gaining weight by 5 days of age. SEEK IMMEDIATE MEDICAL CARE IF:   Your baby is overly tired (lethargic) and does not want to wake up and feed.  Your baby develops an unexplained fever. Document Released: 06/14/2005 Document Revised: 02/14/2013 Document Reviewed: 12/06/2012 [employer] Patient Information 2014 [employer].  

## 2013-07-13 NOTE — Progress Notes (Signed)
Will check TOC at next visit as treatment given today. Doing well--no other complaints. Needs TDaP and 28 wk labs--will schedule back next week.

## 2013-07-13 NOTE — Progress Notes (Signed)
P-90  Patient never picked up her rx for azithromycin.  She will be sure to get her medication and take it this week.

## 2013-07-17 ENCOUNTER — Other Ambulatory Visit (INDEPENDENT_AMBULATORY_CARE_PROVIDER_SITE_OTHER): Payer: Medicaid Other

## 2013-07-17 DIAGNOSIS — O99214 Obesity complicating childbirth: Secondary | ICD-10-CM

## 2013-07-17 DIAGNOSIS — A088 Other specified intestinal infections: Secondary | ICD-10-CM

## 2013-07-17 DIAGNOSIS — A749 Chlamydial infection, unspecified: Secondary | ICD-10-CM

## 2013-07-17 DIAGNOSIS — E669 Obesity, unspecified: Secondary | ICD-10-CM

## 2013-07-17 DIAGNOSIS — Z3493 Encounter for supervision of normal pregnancy, unspecified, third trimester: Secondary | ICD-10-CM

## 2013-07-17 DIAGNOSIS — Z23 Encounter for immunization: Secondary | ICD-10-CM

## 2013-07-17 DIAGNOSIS — Z3480 Encounter for supervision of other normal pregnancy, unspecified trimester: Secondary | ICD-10-CM

## 2013-07-17 LAB — OB RESULTS CONSOLE HIV ANTIBODY (ROUTINE TESTING)
HIV: NONREACTIVE
HIV: NONREACTIVE

## 2013-07-17 NOTE — Progress Notes (Signed)
Pt here today for TDAP injection and 28 labs.

## 2013-07-18 LAB — CBC WITH DIFFERENTIAL/PLATELET
BASOS ABS: 0 10*3/uL (ref 0.0–0.1)
Basophils Relative: 0 % (ref 0–1)
EOS ABS: 0.1 10*3/uL (ref 0.0–0.7)
Eosinophils Relative: 2 % (ref 0–5)
HCT: 31.2 % — ABNORMAL LOW (ref 36.0–46.0)
Hemoglobin: 10.3 g/dL — ABNORMAL LOW (ref 12.0–15.0)
Lymphocytes Relative: 19 % (ref 12–46)
Lymphs Abs: 1.4 10*3/uL (ref 0.7–4.0)
MCH: 28.8 pg (ref 26.0–34.0)
MCHC: 33 g/dL (ref 30.0–36.0)
MCV: 87.2 fL (ref 78.0–100.0)
Monocytes Absolute: 0.6 10*3/uL (ref 0.1–1.0)
Monocytes Relative: 8 % (ref 3–12)
NEUTROS ABS: 5.4 10*3/uL (ref 1.7–7.7)
NEUTROS PCT: 71 % (ref 43–77)
Platelets: 123 10*3/uL — ABNORMAL LOW (ref 150–400)
RBC: 3.58 MIL/uL — ABNORMAL LOW (ref 3.87–5.11)
RDW: 14.5 % (ref 11.5–15.5)
WBC: 7.6 10*3/uL (ref 4.0–10.5)

## 2013-07-18 LAB — RPR

## 2013-07-18 LAB — HIV ANTIBODY (ROUTINE TESTING W REFLEX): HIV: NONREACTIVE

## 2013-07-19 LAB — GLUCOSE TOLERANCE, 1 HOUR (50G) W/O FASTING: Glucose, 1 Hour GTT: 89 mg/dL (ref 70–140)

## 2013-07-26 ENCOUNTER — Encounter: Payer: No Typology Code available for payment source | Admitting: Obstetrics & Gynecology

## 2013-08-03 ENCOUNTER — Encounter: Payer: Medicaid Other | Admitting: Family Medicine

## 2013-08-07 ENCOUNTER — Encounter: Payer: Medicaid Other | Admitting: Family Medicine

## 2013-08-09 ENCOUNTER — Encounter: Payer: Medicaid Other | Admitting: Obstetrics & Gynecology

## 2013-08-15 ENCOUNTER — Ambulatory Visit (INDEPENDENT_AMBULATORY_CARE_PROVIDER_SITE_OTHER): Payer: Medicaid Other | Admitting: Obstetrics & Gynecology

## 2013-08-15 VITALS — BP 130/89 | Wt 248.0 lb

## 2013-08-15 DIAGNOSIS — A749 Chlamydial infection, unspecified: Secondary | ICD-10-CM

## 2013-08-15 DIAGNOSIS — Z3490 Encounter for supervision of normal pregnancy, unspecified, unspecified trimester: Secondary | ICD-10-CM

## 2013-08-15 DIAGNOSIS — Z3480 Encounter for supervision of other normal pregnancy, unspecified trimester: Secondary | ICD-10-CM

## 2013-08-15 DIAGNOSIS — O98819 Other maternal infectious and parasitic diseases complicating pregnancy, unspecified trimester: Secondary | ICD-10-CM

## 2013-08-15 DIAGNOSIS — O98812 Other maternal infectious and parasitic diseases complicating pregnancy, second trimester: Principal | ICD-10-CM

## 2013-08-15 NOTE — Progress Notes (Signed)
Patient told it was important to pick up prescription for chlamydia; discussed risk of PID, fetal pneumonia and conjunctivitis or other problems with untreated chlamydia.  Rx also given to her for her partner: Juel BurrowBrandon Holsey.  Advised no unprotected IC until both are treated. No other complaints or concerns.  Fetal movement and labor precautions reviewed.  Pelvic cultures next visit; will be too early for chlamydia TOC but will still do GC/Chlam culture to check for GC infection.

## 2013-08-15 NOTE — Patient Instructions (Signed)
Chlamydia, Female °Chlamydia is an infection. It is spread through sexual contact. Chlamydia can be in different areas of the body. These areas include the cervix, urethra, throat, or rectum. You may not know you have chlamydia because many people never develop the symptoms. Chlamydia is not difficult to treat once you know you have it. However, if it is left untreated, chlamydia can lead to more serious health problems.  °CAUSES  °Chlamydia is caused by bacteria. It is a sexually transmitted disease. It is passed from an infected partner during intimate contact. This contact could be with the genitals, mouth, or rectal area. Chlamydia can also be passed from mothers to babies during birth. °SIGNS AND SYMPTOMS  °There may not be any symptoms. This is often the case early in the infection. If symptoms develop, they may include: °· Mild pain and discomfort when urinating. °· Redness, soreness, and swelling (inflammation) of the rectum. °· Vaginal discharge. °· Painful intercourse. °· Abdominal pain. °· Bleeding between menstrual periods. °DIAGNOSIS  °To diagnose this infection, your health care provider will do a pelvic exam. Cultures will be taken of the vagina, cervix, urine, and possibly the rectum to verify the diagnosis.  °TREATMENT °You will be given antibiotic medicines. If you are pregnant, certain types of antibiotics will need to be avoided. Any sexual partners should also be treated, even if they do not show symptoms.  °HOME CARE INSTRUCTIONS  °· Take your antibiotics as directed. Make sure you finish them even if you start to feel better. °· Only take over-the-counter or prescription medicines for pain, discomfort, or fever as directed by your health care provider. °· Inform any sexual partners about the infection. They should also be treated. °· Do not have sexual contact until your health care provider tells you it is okay. °· Get plenty of rest. °· Eat a well-balanced diet. °· Drink enough fluids to  keep your urine clear or pale yellow. °· Follow up with your health care provider as directed. °SEEK MEDICAL CARE IF: °· You have painful urination. °· You have abdominal pain. °· You have vaginal discharge. °· You have painful sexual intercourse. °· You are a woman and have bleeding between periods and after sex. °SEEK IMMEDIATE MEDICAL CARE IF:  °· You have a fever or persistent symptoms for more than 2 3 days. °· You have a fever and your symptoms suddenly get worse. °· You experience nausea or vomiting. °· You experience excessive sweating (diaphoresis). °· You have difficulty swallowing. °MAKE SURE YOU:  °· Understand these instructions. °· Will watch your condition. °· Will get help right away if you are not doing well or get worse. °Document Released: 03/24/2005 Document Revised: 04/04/2013 Document Reviewed: 02/19/2013 °[employer]® Patient Information ©2014 [employer]. ° °

## 2013-08-15 NOTE — Progress Notes (Signed)
P-100.  Pt never picked up the Azithromycin for +CT, so I advised pt she needed to go pick that up today and start.

## 2013-08-29 ENCOUNTER — Encounter: Payer: Medicaid Other | Admitting: Obstetrics & Gynecology

## 2013-08-30 ENCOUNTER — Ambulatory Visit (INDEPENDENT_AMBULATORY_CARE_PROVIDER_SITE_OTHER): Payer: Medicaid Other | Admitting: Obstetrics & Gynecology

## 2013-08-30 ENCOUNTER — Encounter: Payer: Self-pay | Admitting: Obstetrics & Gynecology

## 2013-08-30 VITALS — BP 124/84 | Wt 256.0 lb

## 2013-08-30 DIAGNOSIS — O98812 Other maternal infectious and parasitic diseases complicating pregnancy, second trimester: Secondary | ICD-10-CM

## 2013-08-30 DIAGNOSIS — A749 Chlamydial infection, unspecified: Secondary | ICD-10-CM

## 2013-08-30 DIAGNOSIS — E669 Obesity, unspecified: Secondary | ICD-10-CM

## 2013-08-30 DIAGNOSIS — O98819 Other maternal infectious and parasitic diseases complicating pregnancy, unspecified trimester: Secondary | ICD-10-CM

## 2013-08-30 DIAGNOSIS — O99210 Obesity complicating pregnancy, unspecified trimester: Secondary | ICD-10-CM

## 2013-08-30 DIAGNOSIS — Z3490 Encounter for supervision of normal pregnancy, unspecified, unspecified trimester: Secondary | ICD-10-CM

## 2013-08-30 DIAGNOSIS — Z3480 Encounter for supervision of other normal pregnancy, unspecified trimester: Secondary | ICD-10-CM

## 2013-08-30 LAB — OB RESULTS CONSOLE GBS
GBS: NEGATIVE
GBS: NEGATIVE

## 2013-08-30 NOTE — Patient Instructions (Signed)
Return to clinic for any obstetric concerns or go to [facility] for evaluation  

## 2013-08-30 NOTE — Progress Notes (Signed)
P= 80 

## 2013-08-30 NOTE — Progress Notes (Signed)
GBS checked today, check GC/Chlam at 38-[redacted] weeks GA for TOC as patient took treatment on 08/16/13 (35w).  Cervix 3/60/-3; patient had history of shortened cervix earlier in pregnancy.  No other complaints or concerns.  Fetal movement and labor precautions reviewed.

## 2013-09-02 LAB — CULTURE, STREPTOCOCCUS GRP B W/SUSCEPT

## 2013-09-03 ENCOUNTER — Encounter: Payer: Self-pay | Admitting: Obstetrics & Gynecology

## 2013-09-06 ENCOUNTER — Encounter: Payer: Self-pay | Admitting: Obstetrics and Gynecology

## 2013-09-06 ENCOUNTER — Ambulatory Visit (INDEPENDENT_AMBULATORY_CARE_PROVIDER_SITE_OTHER): Payer: Medicaid Other | Admitting: Obstetrics and Gynecology

## 2013-09-06 VITALS — BP 128/83 | Wt 261.0 lb

## 2013-09-06 DIAGNOSIS — Z3480 Encounter for supervision of other normal pregnancy, unspecified trimester: Secondary | ICD-10-CM

## 2013-09-06 DIAGNOSIS — Z3490 Encounter for supervision of normal pregnancy, unspecified, unspecified trimester: Secondary | ICD-10-CM

## 2013-09-06 DIAGNOSIS — E669 Obesity, unspecified: Secondary | ICD-10-CM

## 2013-09-06 DIAGNOSIS — A749 Chlamydial infection, unspecified: Secondary | ICD-10-CM

## 2013-09-06 DIAGNOSIS — O98819 Other maternal infectious and parasitic diseases complicating pregnancy, unspecified trimester: Secondary | ICD-10-CM

## 2013-09-06 DIAGNOSIS — O99210 Obesity complicating pregnancy, unspecified trimester: Secondary | ICD-10-CM

## 2013-09-06 DIAGNOSIS — O98812 Other maternal infectious and parasitic diseases complicating pregnancy, second trimester: Secondary | ICD-10-CM

## 2013-09-06 LAB — OB RESULTS CONSOLE GC/CHLAMYDIA
Chlamydia: NEGATIVE
Gonorrhea: NEGATIVE

## 2013-09-06 NOTE — Progress Notes (Signed)
P - 105 

## 2013-09-06 NOTE — Progress Notes (Signed)
GC/Chl cultures collected for test of cure on 3/12

## 2013-09-06 NOTE — Progress Notes (Signed)
Patient is doing well without complaints. FM/labor precautions reviewed 

## 2013-09-07 LAB — GC/CHLAMYDIA PROBE AMP
CT Probe RNA: NEGATIVE
GC Probe RNA: NEGATIVE

## 2013-09-13 ENCOUNTER — Encounter: Payer: Medicaid Other | Admitting: Obstetrics & Gynecology

## 2013-09-14 ENCOUNTER — Ambulatory Visit (INDEPENDENT_AMBULATORY_CARE_PROVIDER_SITE_OTHER): Payer: Medicaid Other | Admitting: Family Medicine

## 2013-09-14 VITALS — BP 132/94 | Wt 260.0 lb

## 2013-09-14 DIAGNOSIS — Z3480 Encounter for supervision of other normal pregnancy, unspecified trimester: Secondary | ICD-10-CM

## 2013-09-14 DIAGNOSIS — Z3490 Encounter for supervision of normal pregnancy, unspecified, unspecified trimester: Secondary | ICD-10-CM

## 2013-09-14 DIAGNOSIS — R062 Wheezing: Secondary | ICD-10-CM

## 2013-09-14 MED ORDER — ALBUTEROL SULFATE HFA 108 (90 BASE) MCG/ACT IN AERS: 2.0000 | INHALATION_SPRAY | Freq: Four times a day (QID) | RESPIRATORY_TRACT | Status: DC | PRN

## 2013-09-14 NOTE — Progress Notes (Signed)
Doing well Membranes stripped 

## 2013-09-14 NOTE — Progress Notes (Signed)
P - 97 

## 2013-09-14 NOTE — Patient Instructions (Addendum)
Breastfeeding Deciding to breastfeed is one of the best choices you can make for you and your baby. A change in hormones during pregnancy causes your breast tissue to grow and increases the number and size of your milk ducts. These hormones also allow proteins, sugars, and fats from your blood supply to make breast milk in your milk-producing glands. Hormones prevent breast milk from being released before your baby is born as well as prompt milk flow after birth. Once breastfeeding has begun, thoughts of your baby, as well as his or her sucking or crying, can stimulate the release of milk from your milk-producing glands.  BENEFITS OF BREASTFEEDING For Your Baby  Your first milk (colostrum) helps your baby's digestive system function better.   There are antibodies in your milk that help your baby fight off infections.   Your baby has a lower incidence of asthma, allergies, and sudden infant death syndrome.   The nutrients in breast milk are better for your baby than infant formulas and are designed uniquely for your baby's needs.   Breast milk improves your baby's brain development.   Your baby is less likely to develop other conditions, such as childhood obesity, asthma, or type 2 diabetes mellitus.  For You   Breastfeeding helps to create a very special bond between you and your baby.   Breastfeeding is convenient. Breast milk is always available at the correct temperature and costs nothing.   Breastfeeding helps to burn calories and helps you lose the weight gained during pregnancy.   Breastfeeding makes your uterus contract to its prepregnancy size faster and slows bleeding (lochia) after you give birth.   Breastfeeding helps to lower your risk of developing type 2 diabetes mellitus, osteoporosis, and breast or ovarian cancer later in life. SIGNS THAT YOUR BABY IS HUNGRY Early Signs of Hunger  Increased alertness or activity.  Stretching.  Movement of the head from  side to side.  Movement of the head and opening of the mouth when the corner of the mouth or cheek is stroked (rooting).  Increased sucking sounds, smacking lips, cooing, sighing, or squeaking.  Hand-to-mouth movements.  Increased sucking of fingers or hands. Late Signs of Hunger  Fussing.  Intermittent crying. Extreme Signs of Hunger Signs of extreme hunger will require calming and consoling before your baby will be able to breastfeed successfully. Do not wait for the following signs of extreme hunger to occur before you initiate breastfeeding:   Restlessness.  A loud, strong cry.   Screaming. BREASTFEEDING BASICS Breastfeeding Initiation  Find a comfortable place to sit or lie down, with your neck and back well supported.  Place a pillow or rolled up blanket under your baby to bring him or her to the level of your breast (if you are seated). Nursing pillows are specially designed to help support your arms and your baby while you breastfeed.  Make sure that your baby's abdomen is facing your abdomen.   Gently massage your breast. With your fingertips, massage from your chest wall toward your nipple in a circular motion. This encourages milk flow. You may need to continue this action during the feeding if your milk flows slowly.  Support your breast with 4 fingers underneath and your thumb above your nipple. Make sure your fingers are well away from your nipple and your baby's mouth.   Stroke your baby's lips gently with your finger or nipple.   When your baby's mouth is open wide enough, quickly bring your baby to your   breast, placing your entire nipple and as much of the colored area around your nipple (areola) as possible into your baby's mouth.   More areola should be visible above your baby's upper lip than below the lower lip.   Your baby's tongue should be between his or her lower gum and your breast.   Ensure that your baby's mouth is correctly positioned  around your nipple (latched). Your baby's lips should create a seal on your breast and be turned out (everted).  It is common for your baby to suck about 2 3 minutes in order to start the flow of breast milk. Latching Teaching your baby how to latch on to your breast properly is very important. An improper latch can cause nipple pain and decreased milk supply for you and poor weight gain in your baby. Also, if your baby is not latched onto your nipple properly, he or she may swallow some air during feeding. This can make your baby fussy. Burping your baby when you switch breasts during the feeding can help to get rid of the air. However, teaching your baby to latch on properly is still the best way to prevent fussiness from swallowing air while breastfeeding. Signs that your baby has successfully latched on to your nipple:    Silent tugging or silent sucking, without causing you pain.   Swallowing heard between every 3 4 sucks.    Muscle movement above and in front of his or her ears while sucking.  Signs that your baby has not successfully latched on to nipple:   Sucking sounds or smacking sounds from your baby while breastfeeding.  Nipple pain. If you think your baby has not latched on correctly, slip your finger into the corner of your baby's mouth to break the suction and place it between your baby's gums. Attempt breastfeeding initiation again. Signs of Successful Breastfeeding Signs from your baby:   A gradual decrease in the number of sucks or complete cessation of sucking.   Falling asleep.   Relaxation of his or her body.   Retention of a small amount of milk in his or her mouth.   Letting go of your breast by himself or herself. Signs from you:  Breasts that have increased in firmness, weight, and size 1 3 hours after feeding.   Breasts that are softer immediately after breastfeeding.  Increased milk volume, as well as a change in milk consistency and color by  the 5th day of breastfeeding.   Nipples that are not sore, cracked, or bleeding. Signs That Your Baby is Getting Enough Milk  Wetting at least 3 diapers in a 24-hour period. The urine should be clear and pale yellow by age 5 days.  At least 3 stools in a 24-hour period by age 5 days. The stool should be soft and yellow.  At least 3 stools in a 24-hour period by age 7 days. The stool should be seedy and yellow.  No loss of weight greater than 10% of birth weight during the first 3 days of age.  Average weight gain of 4 7 ounces (120 210 mL) per week after age 4 days.  Consistent daily weight gain by age 5 days, without weight loss after the age of 2 weeks. After a feeding, your baby may spit up a small amount. This is common. BREASTFEEDING FREQUENCY AND DURATION Frequent feeding will help you make more milk and can prevent sore nipples and breast engorgement. Breastfeed when you feel the need to reduce   the fullness of your breasts or when your baby shows signs of hunger. This is called "breastfeeding on demand." Avoid introducing a pacifier to your baby while you are working to establish breastfeeding (the first 4 6 weeks after your baby is born). After this time you may choose to use a pacifier. Research has shown that pacifier use during the first year of a baby's life decreases the risk of sudden infant death syndrome (SIDS). Allow your baby to feed on each breast as long as he or she wants. Breastfeed until your baby is finished feeding. When your baby unlatches or falls asleep while feeding from the first breast, offer the second breast. Because newborns are often sleepy in the first few weeks of life, you may need to awaken your baby to get him or her to feed. Breastfeeding times will vary from baby to baby. However, the following rules can serve as a guide to help you ensure that your baby is properly fed:  Newborns (babies 4 weeks of age or younger) may breastfeed every 1 3  hours.  Newborns should not go longer than 3 hours during the day or 5 hours during the night without breastfeeding.  You should breastfeed your baby a minimum of 8 times in a 24-hour period until you begin to introduce solid foods to your baby at around 6 months of age. BREAST MILK PUMPING Pumping and storing breast milk allows you to ensure that your baby is exclusively fed your breast milk, even at times when you are unable to breastfeed. This is especially important if you are going back to work while you are still breastfeeding or when you are not able to be present during feedings. Your lactation consultant can give you guidelines on how long it is safe to store breast milk.  A breast pump is a machine that allows you to pump milk from your breast into a sterile bottle. The pumped breast milk can then be stored in a refrigerator or freezer. Some breast pumps are operated by hand, while others use electricity. Ask your lactation consultant which type will work best for you. Breast pumps can be purchased, but some hospitals and breastfeeding support groups lease breast pumps on a monthly basis. A lactation consultant can teach you how to hand express breast milk, if you prefer not to use a pump.  CARING FOR YOUR BREASTS WHILE YOU BREASTFEED Nipples can become dry, cracked, and sore while breastfeeding. The following recommendations can help keep your breasts moisturized and healthy:  Avoid using soap on your nipples.   Wear a supportive bra. Although not required, special nursing bras and tank tops are designed to allow access to your breasts for breastfeeding without taking off your entire bra or top. Avoid wearing underwire style bras or extremely tight bras.  Air dry your nipples for 3 4minutes after each feeding.   Use only cotton bra pads to absorb leaked breast milk. Leaking of breast milk between feedings is normal.   Use lanolin on your nipples after breastfeeding. Lanolin helps to  maintain your skin's normal moisture barrier. If you use pure lanolin you do not need to wash it off before feeding your baby again. Pure lanolin is not toxic to your baby. You may also hand express a few drops of breast milk and gently massage that milk into your nipples and allow the milk to air dry. In the first few weeks after giving birth, some women experience extremely full breasts (engorgement). Engorgement can make   your breasts feel heavy, warm, and tender to the touch. Engorgement peaks within 3 5 days after you give birth. The following recommendations can help ease engorgement:  Completely empty your breasts while breastfeeding or pumping. You may want to start by applying warm, moist heat (in the shower or with warm water-soaked hand towels) just before feeding or pumping. This increases circulation and helps the milk flow. If your baby does not completely empty your breasts while breastfeeding, pump any extra milk after he or she is finished.  Wear a snug bra (nursing or regular) or tank top for 1 2 days to signal your body to slightly decrease milk production.  Apply ice packs to your breasts, unless this is too uncomfortable for you.  Make sure that your baby is latched on and positioned properly while breastfeeding. If engorgement persists after 48 hours of following these recommendations, contact your health care provider or a lactation consultant. OVERALL HEALTH CARE RECOMMENDATIONS WHILE BREASTFEEDING  Eat healthy foods. Alternate between meals and snacks, eating 3 of each per day. Because what you eat affects your breast milk, some of the foods may make your baby more irritable than usual. Avoid eating these foods if you are sure that they are negatively affecting your baby.  Drink milk, fruit juice, and water to satisfy your thirst (about 10 glasses a day).   Rest often, relax, and continue to take your prenatal vitamins to prevent fatigue, stress, and anemia.  Continue  breast self-awareness checks.  Avoid chewing and smoking tobacco.  Avoid alcohol and drug use. Some medicines that may be harmful to your baby can pass through breast milk. It is important to ask your health care provider before taking any medicine, including all over-the-counter and prescription medicine as well as vitamin and herbal supplements. It is possible to become pregnant while breastfeeding. If birth control is desired, ask your health care provider about options that will be safe for your baby. SEEK MEDICAL CARE IF:   You feel like you want to stop breastfeeding or have become frustrated with breastfeeding.  You have painful breasts or nipples.  Your nipples are cracked or bleeding.  Your breasts are red, tender, or warm.  You have a swollen area on either breast.  You have a fever or chills.  You have nausea or vomiting.  You have drainage other than breast milk from your nipples.  Your breasts do not become full before feedings by the 5th day after you give birth.  You feel sad and depressed.  Your baby is too sleepy to eat well.  Your baby is having trouble sleeping.   Your baby is wetting less than 3 diapers in a 24-hour period.  Your baby has less than 3 stools in a 24-hour period.  Your baby's skin or the white part of his or her eyes becomes yellow.   Your baby is not gaining weight by 5 days of age. SEEK IMMEDIATE MEDICAL CARE IF:   Your baby is overly tired (lethargic) and does not want to wake up and feed.  Your baby develops an unexplained fever. Document Released: 06/14/2005 Document Revised: 02/14/2013 Document Reviewed: 12/06/2012 [employer] Patient Information 2014 [employer]. Vaginal Delivery During delivery, your health care provider will help you give birth to your baby. During a vaginal delivery, you will work to push the baby out of your vagina. However, before you can push your baby out, a few things need to happen. The opening of  your uterus (cervix) has   to soften, thin out, and open up (dilate) all the way to 10 cm. Also, your baby has to move down from the uterus into your vagina.  SIGNS OF LABOR  Your health care provider will first need to make sure you are in labor. Signs of labor include:   Passing what is called the mucous plug before labor begins. This is a small amount of blood-stained mucus.   Having regular, painful uterine contractions.   The time between contractions gets shorter.   The discomfort and pain gradually get more intense.  Contraction pains get worse when walking and do not go away when resting.   Your cervix becomes thinner (effacement) and dilates. BEFORE THE DELIVERY Once you are in labor and admitted into the hospital or care center, your health care provider may do the following:   Perform a complete physical exam.  Review any complications related to pregnancy or labor.  Check your blood pressure, pulse, temperature, and heart rate (vital signs).   Determine if, and when, the rupture of amniotic membranes occurred.  Do a vaginal exam (using a sterile glove and lubricant) to determine:   The position (presentation) of the baby. Is the baby's head presenting first (vertex) in the birth canal (vagina), or are the feet or buttocks first (breech)?   The level (station) of the baby's head within the birth canal.   The effacement and dilatation of the cervix.   An electronic fetal monitor is usually placed on your abdomen when you first arrive. This is used to monitor your contractions and the baby's heart rate.  When the monitor is on your abdomen (external fetal monitor), it can only pick up the frequency and length of your contractions. It cannot tell the strength of your contractions.  If it becomes necessary for your health care provider to know exactly how strong your contractions are or to see exactly what the baby's heart rate is doing, an internal monitor may be  inserted into your vagina and uterus. Your health care provider will discuss the benefits and risks of using an internal monitor and obtain your permission before inserting the device.  Continuous fetal monitoring may be needed if you have an epidural, are receiving certain medicines (such as oxytocin), or have pregnancy or labor complications.  An IV access tube may be placed into a vein in your arm to deliver fluids and medicines if necessary. THREE STAGES OF LABOR AND DELIVERY Normal labor and delivery is divided into three stages. First Stage This stage starts when you begin to contract regularly and your cervix begins to efface and dilate. It ends when your cervix is completely open (fully dilated). The first stage is the longest stage of labor and can last from 3 hours to 15 hours.  Several methods are available to help with labor pain. You and your health care provider will decide which option is best for you. Options include:   Opioid medicines. These are strong pain medicines that you can get through your IV tube or as a shot into your muscle. These medicines lessen pain but do not make it go away completely.  Epidural. A medicine is given through a thin tube that is inserted in your back. The medicine numbs the lower part of your body and prevents any pain in that area.  Paracervical pain medicine. This is an injection of an anesthetic on each side of your cervix.   You may request natural childbirth, which does not involve the use   of pain medicines or an epidural during labor and delivery. Instead, you will use other things, such as breathing exercises, to help cope with the pain. Second Stage The second stage of labor begins when your cervix is fully dilated at 10 cm. It continues until you push your baby down through the birth canal and the baby is born. This stage can take only minutes or several hours.  The location of your baby's head as it moves through the birth canal is  reported as a number called a station. If the baby's head has not started its descent, the station is described as being at minus 3 ( 3). When your baby's head is at the zero station, it is at the middle of the birth canal and is engaged in the pelvis. The station of your baby helps indicate the progress of the second stage of labor.  When your baby is born, your health care provider may hold the baby with his or her head lowered to prevent amniotic fluid, mucus, and blood from getting into the baby's lungs. The baby's mouth and nose may be suctioned with a small bulb syringe to remove any additional fluid.  Your health care provider may then place the baby on your stomach. It is important to keep the baby from getting cold. To do this, the health care provider will dry the baby off, place the baby directly on your skin (with no blankets between you and the baby), and cover the baby with warm, dry blankets.   The umbilical cord is cut. Third Stage During the third stage of labor, your health care provider will deliver the placenta (afterbirth) and make sure your bleeding is under control. The delivery of the placenta usually takes about 5 minutes but can take up to 30 minutes. After the placenta is delivered, a medicine may be given either by IV or injection to help contract the uterus and control bleeding. If you are planning to breastfeed, you can try to do so now. After you deliver the placenta, your uterus should contract and get very firm. If your uterus does not remain firm, your health care provider will massage it. This is important because the contraction of the uterus helps cut off bleeding at the site where the placenta was attached to your uterus. If your uterus does not contract properly and stay firm, you may continue to bleed heavily. If there is a lot of bleeding, medicines may be given to contract the uterus and stop the bleeding.  Document Released: 03/23/2008 Document Revised:  02/14/2013 Document Reviewed: 12/03/2012 [employer] Patient Information 2014 [employer].  

## 2013-09-17 ENCOUNTER — Inpatient Hospital Stay (HOSPITAL_COMMUNITY)
Admission: AD | Admit: 2013-09-17 | Discharge: 2013-09-17 | Disposition: A | Discharge status: Home / Self Care | Payer: Medicaid Other | Source: Ambulatory Visit | Attending: Obstetrics & Gynecology | Admitting: Obstetrics & Gynecology

## 2013-09-17 ENCOUNTER — Encounter (HOSPITAL_COMMUNITY): Payer: Self-pay | Admitting: *Deleted

## 2013-09-17 ENCOUNTER — Inpatient Hospital Stay (HOSPITAL_COMMUNITY)
Admission: AD | Admit: 2013-09-17 | Discharge: 2013-09-19 | DRG: 775 | Disposition: A | Discharge status: Home / Self Care | Payer: Medicaid Other | Source: Ambulatory Visit | Attending: Family Medicine | Admitting: Family Medicine

## 2013-09-17 DIAGNOSIS — O98812 Other maternal infectious and parasitic diseases complicating pregnancy, second trimester: Secondary | ICD-10-CM

## 2013-09-17 DIAGNOSIS — Z87891 Personal history of nicotine dependence: Secondary | ICD-10-CM

## 2013-09-17 DIAGNOSIS — O26872 Cervical shortening, second trimester: Secondary | ICD-10-CM

## 2013-09-17 DIAGNOSIS — A749 Chlamydial infection, unspecified: Secondary | ICD-10-CM

## 2013-09-17 DIAGNOSIS — O99214 Obesity complicating childbirth: Secondary | ICD-10-CM

## 2013-09-17 DIAGNOSIS — O9912 Other diseases of the blood and blood-forming organs and certain disorders involving the immune mechanism complicating childbirth: Secondary | ICD-10-CM

## 2013-09-17 DIAGNOSIS — Z3490 Encounter for supervision of normal pregnancy, unspecified, unspecified trimester: Secondary | ICD-10-CM

## 2013-09-17 DIAGNOSIS — O99210 Obesity complicating pregnancy, unspecified trimester: Secondary | ICD-10-CM

## 2013-09-17 DIAGNOSIS — D696 Thrombocytopenia, unspecified: Secondary | ICD-10-CM | POA: Diagnosis present

## 2013-09-17 DIAGNOSIS — E669 Obesity, unspecified: Secondary | ICD-10-CM | POA: Diagnosis present

## 2013-09-17 DIAGNOSIS — D689 Coagulation defect, unspecified: Secondary | ICD-10-CM | POA: Diagnosis present

## 2013-09-17 NOTE — [facility] Note (Signed)
Worsening contractions.  

## 2013-09-17 NOTE — [facility]U Note (Signed)
Patient states she has been having abdominal cramping and back pain but not sure if contractions. Had a little spotting. Denies leaking. Reports good fetal movement.

## 2013-09-17 NOTE — MAU Note (Addendum)
PT  HAS RETURNED  SAYING UC ARE STRONGER.

## 2013-09-18 ENCOUNTER — Inpatient Hospital Stay (HOSPITAL_COMMUNITY): Payer: Medicaid Other | Admitting: Anesthesiology

## 2013-09-18 ENCOUNTER — Encounter (HOSPITAL_COMMUNITY): Payer: Medicaid Other | Admitting: Anesthesiology

## 2013-09-18 ENCOUNTER — Encounter (HOSPITAL_COMMUNITY): Payer: Self-pay | Admitting: *Deleted

## 2013-09-18 DIAGNOSIS — E669 Obesity, unspecified: Secondary | ICD-10-CM

## 2013-09-18 DIAGNOSIS — D696 Thrombocytopenia, unspecified: Secondary | ICD-10-CM

## 2013-09-18 DIAGNOSIS — D689 Coagulation defect, unspecified: Secondary | ICD-10-CM

## 2013-09-18 DIAGNOSIS — O99214 Obesity complicating childbirth: Secondary | ICD-10-CM

## 2013-09-18 DIAGNOSIS — O9912 Other diseases of the blood and blood-forming organs and certain disorders involving the immune mechanism complicating childbirth: Secondary | ICD-10-CM

## 2013-09-18 LAB — TYPE AND SCREEN
ABO/RH(D): O POS
ANTIBODY SCREEN: NEGATIVE

## 2013-09-18 LAB — PROTEIN / CREATININE RATIO, URINE
Creatinine, Urine: 218.05 mg/dL
PROTEIN CREATININE RATIO: 0.07 (ref 0.00–0.15)
Total Protein, Urine: 16.2 mg/dL

## 2013-09-18 LAB — COMPREHENSIVE METABOLIC PANEL
ALT: 22 U/L (ref 0–35)
AST: 28 U/L (ref 0–37)
Albumin: 3 g/dL — ABNORMAL LOW (ref 3.5–5.2)
Alkaline Phosphatase: 142 U/L — ABNORMAL HIGH (ref 39–117)
BUN: 8 mg/dL (ref 6–23)
CO2: 18 mEq/L — ABNORMAL LOW (ref 19–32)
Calcium: 9.1 mg/dL (ref 8.4–10.5)
Chloride: 100 mEq/L (ref 96–112)
Creatinine, Ser: 0.61 mg/dL (ref 0.50–1.10)
GFR calc non Af Amer: >90 mL/min (ref >90)
Glucose, Bld: 82 mg/dL (ref 70–99)
POTASSIUM: 4.2 meq/L (ref 3.7–5.3)
SODIUM: 136 meq/L — AB (ref 137–147)
TOTAL PROTEIN: 6.4 g/dL (ref 6.0–8.3)
Total Bilirubin: 0.2 mg/dL — ABNORMAL LOW (ref 0.3–1.2)

## 2013-09-18 LAB — CBC
HCT: 34.8 % — ABNORMAL LOW (ref 36.0–46.0)
HEMOGLOBIN: 11.9 g/dL — AB (ref 12.0–15.0)
MCH: 29.4 pg (ref 26.0–34.0)
MCHC: 34.2 g/dL (ref 30.0–36.0)
MCV: 85.9 fL (ref 78.0–100.0)
Platelets: 111 10*3/uL — ABNORMAL LOW (ref 150–400)
RBC: 4.05 MIL/uL (ref 3.87–5.11)
RDW: 15 % (ref 11.5–15.5)
WBC: 12.1 10*3/uL — AB (ref 4.0–10.5)

## 2013-09-18 LAB — ABO/RH: ABO/RH(D): O POS

## 2013-09-18 LAB — OB RESULTS CONSOLE RPR
RPR: NONREACTIVE
RPR: NONREACTIVE

## 2013-09-18 LAB — RPR: RPR: NONREACTIVE

## 2013-09-18 MED ORDER — OXYCODONE-ACETAMINOPHEN 5-325 MG PO TABS: 1.0000 | ORAL_TABLET | ORAL | Status: DC | PRN

## 2013-09-18 MED ORDER — ONDANSETRON HCL 4 MG/2ML IJ SOLN
4.0000 mg | Freq: Four times a day (QID) | INTRAMUSCULAR | Status: DC | PRN
  Administered 2013-09-18: 4 mg via INTRAVENOUS
  Filled 2013-09-18: qty 2

## 2013-09-18 MED ORDER — OXYTOCIN 10 UNIT/ML IJ SOLN
INTRAMUSCULAR | Status: AC
  Filled 2013-09-18: qty 1

## 2013-09-18 MED ORDER — BENZOCAINE-MENTHOL 20-0.5 % EX AERO
1.0000 "application " | INHALATION_SPRAY | CUTANEOUS | Status: DC | PRN
  Administered 2013-09-18: 1 via TOPICAL
  Filled 2013-09-18: qty 56

## 2013-09-18 MED ORDER — DIPHENHYDRAMINE HCL 25 MG PO CAPS: 25.0000 mg | ORAL_CAPSULE | Freq: Four times a day (QID) | ORAL | Status: DC | PRN

## 2013-09-18 MED ORDER — ONDANSETRON HCL 4 MG PO TABS
4.0000 mg | ORAL_TABLET | ORAL | Status: DC | PRN
Start: 2013-09-18 — End: 2013-09-19

## 2013-09-18 MED ORDER — OXYTOCIN 40 UNITS IN LACTATED RINGERS INFUSION - SIMPLE MED
62.5000 mL/h | INTRAVENOUS | Status: DC
  Filled 2013-09-18: qty 1000

## 2013-09-18 MED ORDER — PHENYLEPHRINE 40 MCG/ML (10ML) SYRINGE FOR IV PUSH (FOR BLOOD PRESSURE SUPPORT)
80.0000 ug | PREFILLED_SYRINGE | INTRAVENOUS | Status: DC | PRN
  Filled 2013-09-18: qty 2
  Filled 2013-09-18: qty 10

## 2013-09-18 MED ORDER — LACTATED RINGERS IV SOLN: 500.0000 mL | Freq: Once | INTRAVENOUS | Status: DC

## 2013-09-18 MED ORDER — SENNOSIDES-DOCUSATE SODIUM 8.6-50 MG PO TABS
2.0000 | ORAL_TABLET | ORAL | Status: DC
  Administered 2013-09-18: 2 via ORAL
  Filled 2013-09-18: qty 2

## 2013-09-18 MED ORDER — SIMETHICONE 80 MG PO CHEW: 80.0000 mg | CHEWABLE_TABLET | ORAL | Status: DC | PRN

## 2013-09-18 MED ORDER — OXYTOCIN 40 UNITS IN LACTATED RINGERS INFUSION - SIMPLE MED
1.0000 m[IU]/min | INTRAVENOUS | Status: DC
  Administered 2013-09-18: 2 m[IU]/min via INTRAVENOUS

## 2013-09-18 MED ORDER — LIDOCAINE HCL (PF) 1 % IJ SOLN
INTRAMUSCULAR | Status: DC | PRN
  Administered 2013-09-18 (×4): 4 mL

## 2013-09-18 MED ORDER — OXYTOCIN BOLUS FROM INFUSION: 500.0000 mL | INTRAVENOUS | Status: DC

## 2013-09-18 MED ORDER — IBUPROFEN 600 MG PO TABS: 600.0000 mg | ORAL_TABLET | Freq: Four times a day (QID) | ORAL | Status: DC | PRN

## 2013-09-18 MED ORDER — TETANUS-DIPHTH-ACELL PERTUSSIS 5-2.5-18.5 LF-MCG/0.5 IM SUSP: 0.5000 mL | Freq: Once | INTRAMUSCULAR | Status: DC

## 2013-09-18 MED ORDER — TERBUTALINE SULFATE 1 MG/ML IJ SOLN: 0.2500 mg | Freq: Once | INTRAMUSCULAR | Status: DC | PRN

## 2013-09-18 MED ORDER — LANOLIN HYDROUS EX OINT: TOPICAL_OINTMENT | CUTANEOUS | Status: DC | PRN

## 2013-09-18 MED ORDER — ALBUTEROL SULFATE (2.5 MG/3ML) 0.083% IN NEBU: 2.5000 mg | INHALATION_SOLUTION | Freq: Four times a day (QID) | RESPIRATORY_TRACT | Status: DC | PRN

## 2013-09-18 MED ORDER — EPHEDRINE 5 MG/ML INJ
10.0000 mg | INTRAVENOUS | Status: DC | PRN
  Filled 2013-09-18: qty 2
  Filled 2013-09-18: qty 4

## 2013-09-18 MED ORDER — FENTANYL 2.5 MCG/ML BUPIVACAINE 1/10 % EPIDURAL INFUSION (WH - ANES)
14.0000 mL/h | INTRAMUSCULAR | Status: DC | PRN
  Administered 2013-09-18 (×2): 14 mL/h via EPIDURAL
  Filled 2013-09-18 (×2): qty 125

## 2013-09-18 MED ORDER — PRENATAL MULTIVITAMIN CH
1.0000 | ORAL_TABLET | Freq: Every day | ORAL | Status: DC
  Administered 2013-09-19: 1 via ORAL
  Filled 2013-09-18: qty 1

## 2013-09-18 MED ORDER — LIDOCAINE HCL (PF) 1 % IJ SOLN
30.0000 mL | INTRAMUSCULAR | Status: DC | PRN
  Filled 2013-09-18: qty 30

## 2013-09-18 MED ORDER — LACTATED RINGERS IV SOLN
INTRAVENOUS | Status: DC
  Administered 2013-09-18: 05:00:00 via INTRAVENOUS

## 2013-09-18 MED ORDER — DIBUCAINE 1 % RE OINT: 1.0000 "application " | TOPICAL_OINTMENT | RECTAL | Status: DC | PRN

## 2013-09-18 MED ORDER — OXYTOCIN 10 UNIT/ML IJ SOLN
10.0000 [IU] | Freq: Once | INTRAMUSCULAR | Status: AC
  Administered 2013-09-18: 10 [IU] via INTRAMUSCULAR

## 2013-09-18 MED ORDER — LACTATED RINGERS IV SOLN: 500.0000 mL | INTRAVENOUS | Status: DC | PRN

## 2013-09-18 MED ORDER — ONDANSETRON HCL 4 MG/2ML IJ SOLN: 4.0000 mg | INTRAMUSCULAR | Status: DC | PRN

## 2013-09-18 MED ORDER — ZOLPIDEM TARTRATE 5 MG PO TABS: 5.0000 mg | ORAL_TABLET | Freq: Every evening | ORAL | Status: DC | PRN

## 2013-09-18 MED ORDER — WITCH HAZEL-GLYCERIN EX PADS: 1.0000 "application " | MEDICATED_PAD | CUTANEOUS | Status: DC | PRN

## 2013-09-18 MED ORDER — CITRIC ACID-SODIUM CITRATE 334-500 MG/5ML PO SOLN: 30.0000 mL | ORAL | Status: DC | PRN

## 2013-09-18 MED ORDER — PHENYLEPHRINE 40 MCG/ML (10ML) SYRINGE FOR IV PUSH (FOR BLOOD PRESSURE SUPPORT)
80.0000 ug | PREFILLED_SYRINGE | INTRAVENOUS | Status: DC | PRN
  Filled 2013-09-18: qty 2

## 2013-09-18 MED ORDER — EPHEDRINE 5 MG/ML INJ
10.0000 mg | INTRAVENOUS | Status: DC | PRN
  Filled 2013-09-18: qty 2

## 2013-09-18 MED ORDER — DIPHENHYDRAMINE HCL 50 MG/ML IJ SOLN: 12.5000 mg | INTRAMUSCULAR | Status: DC | PRN

## 2013-09-18 MED ORDER — LACTATED RINGERS IV SOLN
INTRAVENOUS | Status: DC
  Administered 2013-09-18: 02:00:00 via INTRAVENOUS

## 2013-09-18 MED ORDER — IBUPROFEN 600 MG PO TABS
600.0000 mg | ORAL_TABLET | Freq: Four times a day (QID) | ORAL | Status: DC
  Administered 2013-09-18 – 2013-09-19 (×3): 600 mg via ORAL
  Filled 2013-09-18 (×4): qty 1

## 2013-09-18 MED ORDER — ACETAMINOPHEN 325 MG PO TABS: 650.0000 mg | ORAL_TABLET | ORAL | Status: DC | PRN

## 2013-09-18 MED ORDER — DOCUSATE SODIUM 100 MG PO CAPS
100.0000 mg | ORAL_CAPSULE | Freq: Every day | ORAL | Status: DC
  Administered 2013-09-19: 100 mg via ORAL
  Filled 2013-09-18: qty 1

## 2013-09-18 NOTE — Progress Notes (Addendum)
Patient ID: Haze BoydenBrittany A Tilson, female   DOB: 12/29/1990, 23 y.o.   MRN: [id number] Haze BoydenBrittany A Bale is a 23 y.o. G2P0010 at 2127w5d admitted for labor  Subjective: Comfortable after epidural. No H/A, visual disturbance.  Objective: BP 149/90  Pulse 104  Temp(Src) 98.9 F (37.2 C) (Oral)  Resp 18  Ht 5\' 6"  (1.676 m)  Wt 119.75 kg (264 lb)  BMI 42.63 kg/m2  SpO2 96%  LMP 01/09/2013 Filed Vitals:   09/18/13 0819 09/18/13 0831 09/18/13 0901 09/18/13 0931  BP: 139/85 141/79 135/83 149/90  Pulse: 98 99 106 104  Temp:      TempSrc:      Resp: 18 18 18 18   Height:      Weight:      SpO2:       Fetal Heart FHR: 144 bpm, variability: moderate,  accelerations:  Present,  decelerations:  Absent   Contractions: q1-4 x 30-40 sec  SVE:   Dilation: 8.5 Effacement (%): 100 Station: 0;+1 Exam by:: Bernita Buffy. Poe, CNM Exam at 0930. Stretchy ant cx, very small post lip/ 90%, clear AF (exam at 0500 called 9.5 cm)  Assessment / Plan:  Labor: Slow progress with irregular UC pattern > pitocin augmentation  Borderline BP elevations for past several hours> check preE labs Fetal Wellbeing: Category 1 Pain Control:  Adequate with epidural Expected mode of delivery: NSVD  POE,DEIRDRE 09/18/2013, 9:55 AM

## 2013-09-18 NOTE — Anesthesia Preprocedure Evaluation (Addendum)
Anesthesia Evaluation  Patient identified by MRN, date of birth, ID band Patient awake    Reviewed: Allergy & Precautions, H&P , NPO status , Patient's Chart, lab work & pertinent test results, reviewed documented beta blocker date and time   History of Anesthesia Complications Negative for: history of anesthetic complications  Airway Mallampati: III TM Distance: >3 FB Neck ROM: full    Dental  (+) Teeth Intact   Pulmonary neg pulmonary ROS, former smoker,  breath sounds clear to auscultation        Cardiovascular negative cardio ROS  Rhythm:regular Rate:Normal     Neuro/Psych negative neurological ROS  negative psych ROS   GI/Hepatic negative GI ROS, Neg liver ROS,   Endo/Other  Morbid obesity  Renal/GU negative Renal ROS     Musculoskeletal   Abdominal   Peds  Hematology Thrombocytopenia - plt 111   Anesthesia Other Findings   Reproductive/Obstetrics (+) Pregnancy                          Anesthesia Physical Anesthesia Plan  ASA: III  Anesthesia Plan: Epidural   Post-op Pain Management:    Induction:   Airway Management Planned:   Additional Equipment:   Intra-op Plan:   Post-operative Plan:   Informed Consent: I have reviewed the patients History and Physical, chart, labs and discussed the procedure including the risks, benefits and alternatives for the proposed anesthesia with the patient or authorized representative who has indicated his/her understanding and acceptance.     Plan Discussed with:   Anesthesia Plan Comments:         Anesthesia Quick Evaluation

## 2013-09-18 NOTE — Lactation Note (Signed)
This note was copied from the chart of Michele [name] Fiddler. Lactation Consultation Note Initial visit at 10 hours of age.  Mom reports needing help with latching baby.  Hand expression demonstrated with glistening of colostrum.  Assisted in football and then cross cradle holds to latch.  Baby doesn't maintain hold of the breast and only takes a few sucks and pulls/pushes off.  Used a hand pump to help erect nipple for latch, baby repeats the same activity.  Encouraged mom to continue STS and to offer with feeding cues on demand.  Encouraged mom to call for assist.   Mom may be a candidate for a nipple shield, but baby is now only 10 hours old.  Encouraged more attempts prior.  Encouraged mom to massage, hand express and use hand pump prior to latch.  Suck training encouraged and demonstrated.  Baby does not extend tongue well past lower gums, questionable tongue mobility, will need further assessment.    Patient Name: Michele Hayden Today's Date: 09/18/2013 Reason for consult: Initial assessment   Maternal Data Has patient been taught Hand Expression?: Yes Does the patient have breastfeeding experience prior to this delivery?: No  Feeding Feeding Type: Breast Fed Length of feed:  (few sucks didn't maintain)  LATCH Score/Interventions Latch: Repeated attempts needed to sustain latch, nipple held in mouth throughout feeding, stimulation needed to elicit sucking reflex. Intervention(s): Adjust position;Assist with latch;Breast massage;Breast compression  Audible Swallowing: None Intervention(s): Skin to skin;Hand expression  Type of Nipple: Everted at rest and after stimulation Intervention(s): Hand pump  Comfort (Breast/Nipple): Soft / non-tender     Hold (Positioning): Assistance needed to correctly position infant at breast and maintain latch. Intervention(s): Breastfeeding basics reviewed;Support Pillows;Position options;Skin to skin  LATCH Score: 6  Lactation Tools  Discussed/Used     Consult Status Consult Status: Follow-up Date: 09/19/13 Follow-up type: In-patient    [name], [name] 09/18/2013, 11:07 PM

## 2013-09-18 NOTE — Anesthesia Procedure Notes (Signed)
Epidural Patient location during procedure: OB Start time: 09/18/2013 4:25 AM  Staffing Performed by: anesthesiologist   Preanesthetic Checklist Completed: patient identified, site marked, surgical consent, pre-op evaluation, timeout performed, IV checked, risks and benefits discussed and monitors and equipment checked  Epidural Patient position: sitting Prep: site prepped and draped and DuraPrep Patient monitoring: continuous pulse ox and blood pressure Approach: midline Injection technique: LOR air  Needle:  Needle type: Tuohy  Needle gauge: 17 G Needle length: 9 cm and 9 Needle insertion depth: 9 cm Catheter type: closed end flexible Catheter size: 19 Gauge Catheter at skin depth: 14 cm Test dose: negative  Assessment Events: blood not aspirated, injection not painful, no injection resistance, negative IV test and no paresthesia  Additional Notes Discussed risk of headache, infection, bleeding, nerve injury and failed or incomplete block.  Patient voices understanding and wishes to proceed.  Epidural placed with moderate difficulty.  No paresthesia.  Patient tolerated procedure well with no apparent complications.  Jasmine DecemberA. Courney Garrod, MDReason for block:procedure for pain

## 2013-09-18 NOTE — H&P (Signed)
Michele Hayden is a 23 y.o. female presenting for SOL. She say she's had ctx since this morning, which have increased in intensity, medium. Small amount of spotting today, esp after cervical checks in the MAU. No significant loss of fluids. +FM.   1st delivery, EFW = 67%ile with appropriate fetal growth. [facility]. Chlamydia x2 tx. Cervical length of 2cm by transvaginal ultrasound 12.4.15.   History  OB History   Grav Para Term Preterm Abortions TAB SAB Ect Mult Living   2    1 1          Past Medical History  Diagnosis Date  . Chlamydia infection complicating pregnancy in second trimester 04/10/2013   Past Surgical History  Procedure Laterality Date  . Wisdom tooth extraction    . Dilation and curettage of uterus     Family History: family history includes Asthma in her brother; Diabetes in her paternal grandmother; Kidney disease in her paternal grandmother. Social History:  reports that she quit smoking about a year ago. Her smoking use included Cigarettes. She smoked 0.00 packs per day. She has never used smokeless tobacco. She reports that she does not drink alcohol or use illicit drugs.   Prenatal Transfer Tool  Maternal Diabetes: No Genetic Screening: Normal Maternal Ultrasounds/Referrals: Abnormal:  Findings:   Other: shortened cervix early in pregnancy. Fetal Ultrasounds or other Referrals:  None Maternal Substance Abuse:  No Significant Maternal Medications:  None Significant Maternal Lab Results:  Lab values include: Group B Strep negative Other Comments:  None  ROS See HPI  Dilation: 7 Effacement (%): 90 Station: 0 Exam by:: HEATHER, CNM Blood pressure 147/75, pulse 104, temperature 98.1 F (36.7 C), temperature source Oral, resp. rate 18, height 5\' 6"  (1.676 m), weight 119.75 kg (264 lb), last menstrual period 01/09/2013, SpO2 99.00%. Maternal Exam:  Uterine Assessment: Contraction strength is moderate.  Contraction frequency is regular.       Fetal Exam Fetal Monitor Review: Mode: ultrasound.   Baseline rate: 140.  Variability: moderate (6-25 bpm).   Pattern: accelerations present and no decelerations.    Fetal State Assessment: Category I - tracings are normal.     Physical Exam  Constitutional: She is oriented to person, place, and time. She appears well-developed and well-nourished. No distress.  HENT:  Head: Normocephalic and atraumatic.  Cardiovascular: Normal rate and regular rhythm.   Respiratory: Effort normal and breath sounds normal. No respiratory distress.  GI: She exhibits no distension. There is no tenderness. There is no rebound.  Neurological: She is alert and oriented to person, place, and time.  Skin: Skin is warm and dry. No rash noted. No erythema.  Psychiatric: She has a normal mood and affect. Her behavior is normal.    Prenatal labs: ABO, Rh: O/POS/-- (10/06 1153) Antibody: NEG (10/06 1153) Rubella: 11.50 (10/06 1153) RPR: NON REAC (01/20 1335)  HBsAg: NEGATIVE (10/06 1153)  HIV: NON REACTIVE (01/20 1335)  GBS: Negative (03/05 0000)   Assessment/Plan: Michele Hayden is a 23 y.o. G2P0010 @ 5623w5d presents for SOL.   #Labor: Transitional v Active labor. Expectant management at present, made cervical changes between visits at MAU today with increasing ctx, per patient report. Expect SVD.  #Pain: Epidural  #FWB: Category I #ID:  GBS neg #Feeding: Breast #MOC: Undecided    Michaelene SongHall, Jonathan C 09/18/2013, 3:03 AM  I have seen and examined this patient and agree with above documentation in the resident's note.   Rulon AbideKeli Corra Kaine, M.D. [facility]  09/18/2013 4:35 AM

## 2013-09-19 LAB — CBC
HCT: 27.5 % — ABNORMAL LOW (ref 36.0–46.0)
HEMOGLOBIN: 9.2 g/dL — AB (ref 12.0–15.0)
MCH: 29 pg (ref 26.0–34.0)
MCHC: 33.5 g/dL (ref 30.0–36.0)
MCV: 86.8 fL (ref 78.0–100.0)
PLATELETS: 88 10*3/uL — AB (ref 150–400)
RBC: 3.17 MIL/uL — AB (ref 3.87–5.11)
RDW: 15.1 % (ref 11.5–15.5)
WBC: 11.5 10*3/uL — ABNORMAL HIGH (ref 4.0–10.5)

## 2013-09-19 MED ORDER — IBUPROFEN 600 MG PO TABS: 600.0000 mg | ORAL_TABLET | Freq: Four times a day (QID) | ORAL | Status: DC | PRN

## 2013-09-19 NOTE — Lactation Note (Signed)
This note was copied from the chart of Michele Hayden. Lactation Consultation Note  Patient Name: Michele Hayden Today's Date: 09/19/2013 Reason for consult: Follow-up assessment;Difficult latch Mom called for assist with latching baby. Mom and RN have not been able to help baby obtain deep latch or sustain a latch. Mom has started supplementing because of this. Assisted Mom with positioning on the left breast, tried football hold. After several attempts baby was not able to obtain or sustain a latch, Mom's nipple flattens with breast compression. The left nipple has short nipple shaft and baby has unorganized suck, some tongue thrusting observed. Initiated #20 nipple shield. Baby latched and nursed for 30 minutes total off and on, however for the 1st 15 minutes, baby came on/off the nipple shield. LC demonstrated to Mom how to pre-load nipple shield with EBM/formula using curved tipped syringe. After the initial 15 minutes, baby was able to organize his suck and obtain good depth on nipple shield. Nursed for another 15 minutes in a good suckling pattern with 1-2 swallows noted. During this feeding scant amount of colostrum present in the nipple shield. Baby appeared asleep but after few minutes, began giving feeding ques. LC assisted Mom with hand expression on the right breast and placing baby in foot ball hold. Baby latched easily without the nipple shield and nursed with stimulation for another 15 minutes. Advised Mom to hand express or pre-pump to help with latch. Try to latch without the nipple shield, but use if necessary. Look for colostrum in the nipple shield. Set up DEBP and advised Mom to post pump after feedings to encourage milk production and give baby back any amount of EBM she receives. Advised she may not see any breast milk today with pumping. Advised Mom to call for assist with feedings till she is comfortable latching baby.   Maternal Data    Feeding Feeding Type: Breast  Fed Length of feed: 15 min  LATCH Score/Interventions Latch: Grasps breast easily, tongue down, lips flanged, rhythmical sucking. (right breast without nipple shield) Intervention(s): Adjust position;Assist with latch;Breast massage;Breast compression  Audible Swallowing: A few with stimulation Intervention(s): Hand expression  Type of Nipple: Everted at rest and after stimulation (left short nipple shaft) Intervention(s): Hand pump  Comfort (Breast/Nipple): Soft / non-tender     Hold (Positioning): Assistance needed to correctly position infant at breast and maintain latch. Intervention(s): Breastfeeding basics reviewed;Support Pillows;Position options;Skin to skin  LATCH Score: 8  Lactation Tools Discussed/Used Tools: Nipple Dorris CarnesShields;Pump Nipple shield size: 20;24 Breast pump type: Double-Electric Breast Pump WIC Program: Yes   Consult Status Consult Status: Follow-up Date: 09/19/13 Follow-up type: In-patient    Michele Hayden, Michele Hayden 09/19/2013, 1:39 PM

## 2013-09-19 NOTE — Anesthesia Postprocedure Evaluation (Signed)
Anesthesia Post Note  Patient: Michele Hayden  Procedure(s) Performed: * No procedures listed *  Anesthesia type: Epidural  Patient location: Mother/Baby  Post pain: Pain level controlled  Post assessment: Post-op Vital signs reviewed  Last Vitals:  Filed Vitals:   09/19/13 0548  BP: 137/84  Pulse: 83  Temp: 36.4 C  Resp: 17    Post vital signs: Reviewed  Level of consciousness:alert  Complications: No apparent anesthesia complications

## 2013-09-19 NOTE — Progress Notes (Signed)
Ur chart review completed.  

## 2013-09-19 NOTE — Discharge Summary (Signed)
Obstetric Discharge Summary Reason for Admission: onset of labor Prenatal Procedures: none Intrapartum Procedures: spontaneous vaginal delivery Postpartum Procedures: none Complications-Operative and Postpartum: none  Hospital Course: Michele Hayden is a 22yo female G2P1011 admitted for SOL. She had NSVD at 39w 5d complicated by a partial 3rd degree perineal laceration. She is breast feeding, planning [facility] Discussed IUD with patient. She is doing well, without complaints. Pain and bleeding are under control. She describes bleeding as being similar to a heavy period. She is tolerating PO, voiding, and ambulating well. She plans to have a circumcision elsewhere. Platelet levels of 88 are consistent with amount of blood loss, BP stable.     Delivery Note At 12:45 PM a healthy female was delivered via Vaginal, Spontaneous Delivery (Presentation: Left Occiput Anterior).  APGAR: 8, 9; weight 7 lb 7 oz (3374 g).   Placenta status: Intact, Spontaneous.  Cord: 3 vessels with the following complications: Short.  Cord pH: ---  Anesthesia: Epidural  Episiotomy: None Lacerations: Perineal;3rd degree Est. Blood Loss (mL): 300  Mom to postpartum.  Baby to Couplet care / Skin to Skin.  Waldon Merlnderson, Brett M 09/19/2013, 7:44 AM     H/H: Lab Results  Component Value Date/Time   HGB 9.2* 09/19/2013  6:35 AM   HCT 27.5* 09/19/2013  6:35 AM    Physical Exam: Abd: Appropriately tender, ND No c/c/e, Neg homan's sign, neg cords Lochia Appropriate CV: RRR Pulm: CTA bilat   Discharge Diagnoses: Term Pregnancy-delivered  Discharge Information: Date: 01/07/2011 Activity: pelvic rest Diet: routine  Medications: None Breast feeding:  Yes Condition: stable Instructions: refer to handout Discharge to: home      Medication List    ASK your doctor about these medications       acetaminophen 325 MG tablet  Commonly known as:   TYLENOL  Take 650 mg by mouth every 6 (six) hours as needed for pain.     albuterol 108 (90 BASE) MCG/ACT inhaler  Commonly known as:  PROVENTIL HFA;VENTOLIN HFA  Inhale 2 puffs into the lungs every 6 (six) hours as needed for wheezing or shortness of breath.     dextromethorphan-guaiFENesin 30-600 MG per 12 hr tablet  Commonly known as:  MUCINEX DM  Take 2 tablets by mouth 2 (two) times daily as needed for cough. Patient stated that she has used a generic form of Mucinex.     guaiFENesin 100 MG/5ML liquid  Commonly known as:  ROBITUSSIN  Take 200 mg by mouth daily as needed for cough.     multivitamin-prenatal 27-0.8 MG Tabs tablet  Take 1 tablet by mouth daily at 12 noon.         Waldon MerlAnderson, Brett M 09/19/2013,7:45 AM  I was present for the exam and agree with above.  [place] Sharra Cay[place]ab, [place] 09/19/2013 10:32 AM

## 2013-09-20 ENCOUNTER — Encounter: Payer: Medicaid Other | Admitting: Family Medicine

## 2013-09-20 NOTE — H&P (Signed)
Attestation of Attending Supervision of Obstetric Fellow: Evaluation and management procedures were performed by the Obstetric Fellow under my supervision and collaboration.  I have reviewed the Obstetric Fellow's note and chart, and I agree with the management and plan.  Skya Mccullum, MD, FACOG Attending Obstetrician & Gynecologist Faculty Practice, [facility]    

## 2013-11-06 ENCOUNTER — Ambulatory Visit: Payer: Medicaid Other | Admitting: Obstetrics & Gynecology

## 2013-11-09 ENCOUNTER — Ambulatory Visit: Payer: Medicaid Other | Admitting: Family Medicine

## 2014-01-17 ENCOUNTER — Inpatient Hospital Stay (HOSPITAL_COMMUNITY)
Admission: AD | Admit: 2014-01-17 | Discharge: 2014-01-17 | Disposition: A | Discharge status: Home / Self Care | Payer: Medicaid Other | Source: Ambulatory Visit | Attending: Obstetrics & Gynecology | Admitting: Obstetrics & Gynecology

## 2014-01-17 ENCOUNTER — Encounter (HOSPITAL_COMMUNITY): Payer: Self-pay | Admitting: *Deleted

## 2014-01-17 ENCOUNTER — Inpatient Hospital Stay (HOSPITAL_COMMUNITY): Payer: Medicaid Other

## 2014-01-17 DIAGNOSIS — B373 Candidiasis of vulva and vagina: Secondary | ICD-10-CM | POA: Insufficient documentation

## 2014-01-17 DIAGNOSIS — Z87891 Personal history of nicotine dependence: Secondary | ICD-10-CM | POA: Diagnosis not present

## 2014-01-17 DIAGNOSIS — R109 Unspecified abdominal pain: Secondary | ICD-10-CM | POA: Insufficient documentation

## 2014-01-17 DIAGNOSIS — B3731 Acute candidiasis of vulva and vagina: Secondary | ICD-10-CM | POA: Insufficient documentation

## 2014-01-17 DIAGNOSIS — O2390 Unspecified genitourinary tract infection in pregnancy, unspecified trimester: Secondary | ICD-10-CM | POA: Insufficient documentation

## 2014-01-17 DIAGNOSIS — O26899 Other specified pregnancy related conditions, unspecified trimester: Secondary | ICD-10-CM

## 2014-01-17 DIAGNOSIS — Y92009 Unspecified place in unspecified non-institutional (private) residence as the place of occurrence of the external cause: Secondary | ICD-10-CM | POA: Insufficient documentation

## 2014-01-17 DIAGNOSIS — B379 Candidiasis, unspecified: Secondary | ICD-10-CM

## 2014-01-17 DIAGNOSIS — Y93F1 Activity, caregiving, bathing: Secondary | ICD-10-CM | POA: Diagnosis not present

## 2014-01-17 DIAGNOSIS — W010XXA Fall on same level from slipping, tripping and stumbling without subsequent striking against object, initial encounter: Secondary | ICD-10-CM | POA: Insufficient documentation

## 2014-01-17 LAB — URINALYSIS, ROUTINE W REFLEX MICROSCOPIC
Bilirubin Urine: NEGATIVE
Glucose, UA: NEGATIVE mg/dL
Hgb urine dipstick: NEGATIVE
Ketones, ur: NEGATIVE mg/dL
NITRITE: NEGATIVE
PH: 5.5 (ref 5.0–8.0)
Protein, ur: NEGATIVE mg/dL
Urobilinogen, UA: 0.2 mg/dL (ref 0.0–1.0)

## 2014-01-17 LAB — HCG, QUANTITATIVE, PREGNANCY: HCG, BETA CHAIN, QUANT, S: 74324 m[IU]/mL — AB (ref <5)

## 2014-01-17 LAB — WET PREP, GENITAL
CLUE CELLS WET PREP: NONE SEEN
Trich, Wet Prep: NONE SEEN
WBC, Wet Prep HPF POC: NONE SEEN

## 2014-01-17 LAB — CBC
HCT: 35.5 % — ABNORMAL LOW (ref 36.0–46.0)
Hemoglobin: 12.1 g/dL (ref 12.0–15.0)
MCH: 29.9 pg (ref 26.0–34.0)
MCHC: 34.1 g/dL (ref 30.0–36.0)
MCV: 87.7 fL (ref 78.0–100.0)
PLATELETS: 132 10*3/uL — AB (ref 150–400)
RBC: 4.05 MIL/uL (ref 3.87–5.11)
RDW: 13.5 % (ref 11.5–15.5)
WBC: 8.2 10*3/uL (ref 4.0–10.5)

## 2014-01-17 LAB — URINE MICROSCOPIC-ADD ON

## 2014-01-17 LAB — POCT PREGNANCY, URINE: Preg Test, Ur: POSITIVE — AB

## 2014-01-17 LAB — OB RESULTS CONSOLE GC/CHLAMYDIA
Chlamydia: NEGATIVE
GC PROBE AMP, GENITAL: NEGATIVE

## 2014-01-17 MED ORDER — FLUCONAZOLE 150 MG PO TABS
150.0000 mg | ORAL_TABLET | Freq: Once | ORAL | Status: AC
  Administered 2014-01-17: 150 mg via ORAL
  Filled 2014-01-17: qty 1

## 2014-01-17 NOTE — MAU Note (Signed)
Patient states she had a vaginal deliver on 3-24. Has had one period sometime the end of April. None since. States she has had a positive home pregnancy test. States she fell in the tub this am and hit her abdomen and is not having abdominal cramping. Has had some bleeding earlier, none now. Nausea, no vomiting.

## 2014-01-17 NOTE — MAU Provider Note (Signed)
History     CSN: [id number]  Arrival date and time: 01/17/14 1437   None     Chief Complaint  Patient presents with  . Possible Pregnancy  . Fall  . Abdominal Cramping   Possible Pregnancy Associated symptoms include abdominal pain and nausea. Pertinent negatives include no chills, fever or vomiting.  Fall Associated symptoms include abdominal pain and nausea. Pertinent negatives include no fever or vomiting.  Abdominal Cramping Associated symptoms include nausea. Pertinent negatives include no fever or vomiting.    Pt is a 23 yo G3P1011 at Unknown wks pregnancy here with report of having a vaginal deliver on 3-24. Has had one period sometime the end of April. None since. States she has had a positive home pregnancy test. States she fell in the tub this am and hit her abdomen and is not having abdominal cramping. Has had some bleeding earlier, none now. Nausea, no vomiting     Past Medical History  Diagnosis Date  . Chlamydia infection complicating pregnancy in second trimester 04/10/2013    Past Surgical History  Procedure Laterality Date  . Wisdom tooth extraction    . Dilation and curettage of uterus      Family History  Problem Relation Age of Onset  . Asthma Brother   . Diabetes Paternal Grandmother   . Kidney disease Paternal Grandmother     History  Substance Use Topics  . Smoking status: Former Smoker    Types: Cigarettes    Quit date: 09/26/2012  . Smokeless tobacco: Never Used  . Alcohol Use: No    Allergies: No Known Allergies  Prescriptions prior to admission  Medication Sig Dispense Refill  . Multiple Vitamins-Minerals (MULTIVITAMIN PO) Take 1 tablet by mouth daily.      Marland Kitchen albuterol (PROVENTIL HFA;VENTOLIN HFA) 108 (90 BASE) MCG/ACT inhaler Inhale 2 puffs into the lungs every 6 (six) hours as needed for wheezing or shortness of breath.  1 Inhaler  2    Review of Systems  Constitutional: Negative for fever and chills.  Gastrointestinal:  Positive for nausea and abdominal pain. Negative for vomiting.  Genitourinary:       Vaginal bleeding  All other systems reviewed and are negative.  Physical Exam   Blood pressure 126/70, pulse 80, temperature 98.6 F (37 C), temperature source Oral, resp. rate 20, height 5\' 5"  (1.651 m), weight 115.304 kg (254 lb 3.2 oz), last menstrual period 01/09/2013, SpO2 100.00%, unknown if currently breastfeeding.  Physical Exam  Constitutional: She is oriented to person, place, and time. She appears well-developed and well-nourished.  HENT:  Head: Normocephalic.  Neck: Normal range of motion. Neck supple.  Cardiovascular: Normal rate, regular rhythm and normal heart sounds.   Respiratory: Effort normal and breath sounds normal. No respiratory distress.  GI: Soft. She exhibits no mass. There is no tenderness. There is no rebound and no guarding.  Genitourinary: No bleeding around the vagina. Vaginal discharge (thin yellowish discharge with odor) found.  Neurological: She is alert and oriented to person, place, and time.  Skin: Skin is warm and dry.    MAU Course  Procedures Results for orders placed during the hospital encounter of 01/17/14 (from the past 24 hour(s))  URINALYSIS, ROUTINE W REFLEX MICROSCOPIC     Status: Abnormal   Collection Time    01/17/14  3:20 PM      Result Value Ref Range   Color, Urine YELLOW  YELLOW   APPearance HAZY (*) CLEAR   Specific Gravity, Urine >1.030 (*)  1.005 - 1.030   pH 5.5  5.0 - 8.0   Glucose, UA NEGATIVE  NEGATIVE mg/dL   Hgb urine dipstick NEGATIVE  NEGATIVE   Bilirubin Urine NEGATIVE  NEGATIVE   Ketones, ur NEGATIVE  NEGATIVE mg/dL   Protein, ur NEGATIVE  NEGATIVE mg/dL   Urobilinogen, UA 0.2  0.0 - 1.0 mg/dL   Nitrite NEGATIVE  NEGATIVE   Leukocytes, UA SMALL (*) NEGATIVE  URINE MICROSCOPIC-ADD ON     Status: Abnormal   Collection Time    01/17/14  3:20 PM      Result Value Ref Range   Squamous Epithelial / LPF FEW (*) RARE   WBC, UA  21-50  <3 WBC/hpf   RBC / HPF 11-20  <3 RBC/hpf   Bacteria, UA MANY (*) RARE   Urine-Other MUCOUS PRESENT    POCT PREGNANCY, URINE     Status: Abnormal   Collection Time    01/17/14  3:31 PM      Result Value Ref Range   Preg Test, Ur POSITIVE (*) NEGATIVE  HCG, QUANTITATIVE, PREGNANCY     Status: Abnormal   Collection Time    01/17/14  5:23 PM      Result Value Ref Range   hCG, Beta ChainSharene Butters, Quant, [place] [phone] (*) <5 mIU/mL  CBC     Status: Abnormal   Collection Time    01/17/14  5:23 PM      Result Value Ref Range   WBC 8.2  4.0 - 10.5 K/uL   RBC 4.05  3.87 - 5.11 MIL/uL   Hemoglobin 12.1  12.0 - 15.0 g/dL   HCT 14.735.5 (*) 82.936.0 - 56.246.0 %   MCV 87.7  78.0 - 100.0 fL   MCH 29.9  26.0 - 34.0 pg   MCHC 34.1  30.0 - 36.0 g/dL   RDW 13.013.5  86.511.5 - 78.415.5 %   Platelets 132 (*) 150 - 400 K/uL  WET PREP, GENITAL     Status: Abnormal   Collection Time    01/17/14  5:30 PM      Result Value Ref Range   Yeast Wet Prep HPF POC FEW (*) NONE SEEN   Trich, Wet Prep NONE SEEN  NONE SEEN   Clue Cells Wet Prep HPF POC NONE SEEN  NONE SEEN   WBC, Wet Prep HPF POC NONE SEEN  NONE SEEN   Ultrasound: FINDINGS:  Intrauterine gestational sac: Single  Yolk sac: No  Embryo: Yes  Cardiac Activity: Yes  Heart Rate: 150 bpm  MSD: mm w d  CRL: 4.6 cm 11 w 3 d US EDC: 08/05/2014  Maternal uterus/adnexae:  Subchorionic hemorrhage: None  Right ovary: Normal  Left ovary: Norma  Other :None  Free fluid: No  IMPRESSION:  1. Single living intrauterine gestation. The estimated gestational  age is 11 weeks and 3 days.  2. No complications.   Assessment and Plan  23 yo G3P1011 at 1146w3d wks IUP Yeast Infection in Pregnancy  Plan: Diflucan 150 mg in MAU Begin prenatal care GC/CT pending  Marlis EdelsonKARIM, Kashis Penley N 01/17/2014, 5:06 PM

## 2014-01-17 NOTE — Discharge Instructions (Signed)

## 2014-01-18 LAB — GC/CHLAMYDIA PROBE AMP
CT Probe RNA: NEGATIVE
GC Probe RNA: NEGATIVE

## 2014-01-18 NOTE — MAU Provider Note (Signed)

## 2014-01-22 ENCOUNTER — Encounter (HOSPITAL_COMMUNITY): Payer: Self-pay | Admitting: Emergency Medicine

## 2014-01-22 ENCOUNTER — Emergency Department (HOSPITAL_COMMUNITY)
Admission: EM | Admit: 2014-01-22 | Discharge: 2014-01-22 | Disposition: A | Discharge status: Home / Self Care | Payer: Medicaid Other | Source: Home / Self Care | Attending: Emergency Medicine | Admitting: Emergency Medicine

## 2014-01-22 DIAGNOSIS — T148 Other injury of unspecified body region: Secondary | ICD-10-CM

## 2014-01-22 DIAGNOSIS — W57XXXA Bitten or stung by nonvenomous insect and other nonvenomous arthropods, initial encounter: Secondary | ICD-10-CM

## 2014-01-22 MED ORDER — CEPHALEXIN 500 MG PO CAPS: 500.0000 mg | ORAL_CAPSULE | Freq: Three times a day (TID) | ORAL | Status: DC

## 2014-01-22 MED ORDER — HYDROCODONE-ACETAMINOPHEN 5-325 MG PO TABS: 1.0000 | ORAL_TABLET | Freq: Four times a day (QID) | ORAL | Status: DC | PRN

## 2014-01-22 NOTE — Discharge Instructions (Signed)
You got bit by something, but I am not sure what. Alternate heat and cold to help with pain and swelling. Elevate the leg. Take Norco as needed for severe pain. Take keflex (an antibiotic) 1 pill 3 times a day for 7 days.  Follow up if it is not improving in 2 days.

## 2014-01-22 NOTE — ED Notes (Signed)
Reports noticing pruritic "bug bite" to right lower leg yesterday; today has severe pain to lesion with deep redness.  Tiny pustule noted to center of lesion.  Denies fevers.  Pt is approx [redacted] wks pregnant.

## 2014-01-22 NOTE — ED Provider Notes (Signed)
CSN: [id number]     Arrival date & time 01/22/14  1727 History   First MD Initiated Contact with Patient 01/22/14 1820     Chief Complaint  Patient presents with  . Insect Bite   (Consider location/radiation/quality/duration/timing/severity/associated sxs/prior Treatment) HPI She is here today for evaluation of a bug bite on her right lower leg. She first noticed it yesterday. It was a little red and itchy. Since then, it has become more painful and red. There is a little pustule at the center. She did not see what bit her. Denies fevers or chills.  She is about [redacted] weeks pregnant.  Past Medical History  Diagnosis Date  . Chlamydia infection complicating pregnancy in second trimester 04/10/2013   Past Surgical History  Procedure Laterality Date  . Wisdom tooth extraction    . Dilation and curettage of uterus     Family History  Problem Relation Age of Onset  . Asthma Brother   . Diabetes Paternal Grandmother   . Kidney disease Paternal Grandmother    History  Substance Use Topics  . Smoking status: Former Smoker    Types: Cigarettes    Quit date: 09/26/2012  . Smokeless tobacco: Never Used  . Alcohol Use: No   OB History   Grav Para Term Preterm Abortions TAB SAB Ect Mult Living   3 1 1  1 1    1      Review of Systems  Constitutional: Negative.   Skin: Positive for wound.    Allergies  Review of patient's allergies indicates no known allergies.  Home Medications   Prior to Admission medications   Medication Sig Start Date End Date Taking? Authorizing Provider  albuterol (PROVENTIL HFA;VENTOLIN HFA) 108 (90 BASE) MCG/ACT inhaler Inhale 2 puffs into the lungs every 6 (six) hours as needed for wheezing or shortness of breath. 09/14/13   Reva Boresanya S Pratt, MD  cephALEXin (KEFLEX) 500 MG capsule Take 1 capsule (500 mg total) by mouth 3 (three) times daily. 01/22/14   Charm RingsErin J Echo Propp, MD  HYDROcodone-acetaminophen (NORCO) 5-325 MG per tablet Take 1 tablet by mouth every 6 (six)  hours as needed for moderate pain. 01/22/14   Charm RingsErin J Dhairya Corales, MD  Multiple Vitamins-Minerals (MULTIVITAMIN PO) Take 1 tablet by mouth daily.    Historical Provider, MD   BP 126/77  Pulse 87  Temp(Src) 97.7 F (36.5 C) (Oral)  Resp 16  SpO2 98%  Breastfeeding? No Physical Exam  Constitutional: She appears well-developed and well-nourished. No distress.  Skin:  3cm area of non-blanching erythema with central pustule on right lower leg.  No fluctuance.  Tender to touch.    ED Course  Procedures (including critical care time) Labs Review Labs Reviewed - No data to display  Imaging Review No results found.   MDM   1. Insect bite    I suspect a spider bite. Recommended symptomatic treatment with alternating ice and heat, elevation. Norco provided for pain. Given her pregnancy and unknown source of bites, we'll also treat for cellulitis with Keflex 500 mg 3 times a day x1 week. Followup if not improving in 2 days.    Charm RingsErin J Mare Ludtke, MD 01/22/14 Ebony [id number]

## 2014-03-20 ENCOUNTER — Encounter: Payer: Self-pay | Admitting: Obstetrics & Gynecology

## 2014-03-25 ENCOUNTER — Encounter: Payer: Medicaid Other | Admitting: Obstetrics & Gynecology

## 2014-04-03 ENCOUNTER — Encounter: Payer: Medicaid Other | Admitting: Physician Assistant

## 2014-04-09 ENCOUNTER — Encounter: Payer: Medicaid Other | Admitting: Obstetrics & Gynecology

## 2014-04-11 ENCOUNTER — Inpatient Hospital Stay (HOSPITAL_COMMUNITY): Payer: Medicaid Other

## 2014-04-11 ENCOUNTER — Inpatient Hospital Stay (HOSPITAL_COMMUNITY)
Admission: AD | Admit: 2014-04-11 | Discharge: 2014-04-16 | DRG: 765 | Disposition: A | Discharge status: Home / Self Care | Payer: Medicaid Other | Source: Ambulatory Visit | Attending: Obstetrics & Gynecology | Admitting: Obstetrics & Gynecology

## 2014-04-11 ENCOUNTER — Encounter (HOSPITAL_COMMUNITY): Payer: Self-pay | Admitting: *Deleted

## 2014-04-11 ENCOUNTER — Observation Stay (HOSPITAL_COMMUNITY): Payer: Medicaid Other | Admitting: Anesthesiology

## 2014-04-11 ENCOUNTER — Encounter (HOSPITAL_COMMUNITY): Payer: Medicaid Other | Admitting: Anesthesiology

## 2014-04-11 DIAGNOSIS — D696 Thrombocytopenia, unspecified: Secondary | ICD-10-CM | POA: Diagnosis present

## 2014-04-11 DIAGNOSIS — O99214 Obesity complicating childbirth: Secondary | ICD-10-CM | POA: Diagnosis present

## 2014-04-11 DIAGNOSIS — O411220 Chorioamnionitis, second trimester, not applicable or unspecified: Secondary | ICD-10-CM | POA: Diagnosis present

## 2014-04-11 DIAGNOSIS — Z6836 Body mass index (BMI) 36.0-36.9, adult: Secondary | ICD-10-CM

## 2014-04-11 DIAGNOSIS — O322XX0 Maternal care for transverse and oblique lie, not applicable or unspecified: Secondary | ICD-10-CM | POA: Diagnosis present

## 2014-04-11 DIAGNOSIS — R1084 Generalized abdominal pain: Secondary | ICD-10-CM | POA: Diagnosis present

## 2014-04-11 DIAGNOSIS — O9902 Anemia complicating childbirth: Secondary | ICD-10-CM | POA: Diagnosis present

## 2014-04-11 DIAGNOSIS — O9912 Other diseases of the blood and blood-forming organs and certain disorders involving the immune mechanism complicating childbirth: Secondary | ICD-10-CM | POA: Diagnosis present

## 2014-04-11 DIAGNOSIS — O4702 False labor before 37 completed weeks of gestation, second trimester: Secondary | ICD-10-CM

## 2014-04-11 DIAGNOSIS — Z833 Family history of diabetes mellitus: Secondary | ICD-10-CM | POA: Diagnosis not present

## 2014-04-11 DIAGNOSIS — O99892 Other specified diseases and conditions complicating childbirth: Secondary | ICD-10-CM

## 2014-04-11 DIAGNOSIS — Z3A23 23 weeks gestation of pregnancy: Secondary | ICD-10-CM | POA: Diagnosis present

## 2014-04-11 DIAGNOSIS — D649 Anemia, unspecified: Secondary | ICD-10-CM | POA: Diagnosis present

## 2014-04-11 DIAGNOSIS — Z87891 Personal history of nicotine dependence: Secondary | ICD-10-CM | POA: Diagnosis not present

## 2014-04-11 DIAGNOSIS — O4592 Premature separation of placenta, unspecified, second trimester: Principal | ICD-10-CM | POA: Diagnosis present

## 2014-04-11 DIAGNOSIS — O209 Hemorrhage in early pregnancy, unspecified: Secondary | ICD-10-CM

## 2014-04-11 LAB — CBC
HEMATOCRIT: 35 % — AB (ref 36.0–46.0)
HEMOGLOBIN: 12.2 g/dL (ref 12.0–15.0)
MCH: 31.1 pg (ref 26.0–34.0)
MCHC: 34.9 g/dL (ref 30.0–36.0)
MCV: 89.3 fL (ref 78.0–100.0)
Platelets: 101 10*3/uL — ABNORMAL LOW (ref 150–400)
RBC: 3.92 MIL/uL (ref 3.87–5.11)
RDW: 14.5 % (ref 11.5–15.5)
WBC: 10.5 10*3/uL (ref 4.0–10.5)

## 2014-04-11 MED ORDER — LIDOCAINE HCL (PF) 1 % IJ SOLN
INTRAMUSCULAR | Status: AC
  Filled 2014-04-11: qty 30

## 2014-04-11 MED ORDER — FENTANYL CITRATE 0.05 MG/ML IJ SOLN
INTRAMUSCULAR | Status: AC
  Administered 2014-04-11: 100 ug
  Filled 2014-04-11: qty 2

## 2014-04-11 MED ORDER — LACTATED RINGERS IV SOLN
INTRAVENOUS | Status: DC
  Administered 2014-04-11 – 2014-04-13 (×7): via INTRAVENOUS

## 2014-04-11 MED ORDER — PHENYLEPHRINE 40 MCG/ML (10ML) SYRINGE FOR IV PUSH (FOR BLOOD PRESSURE SUPPORT)
80.0000 ug | PREFILLED_SYRINGE | INTRAVENOUS | Status: DC | PRN
  Filled 2014-04-11: qty 10

## 2014-04-11 MED ORDER — EPHEDRINE 5 MG/ML INJ: 10.0000 mg | INTRAVENOUS | Status: DC | PRN

## 2014-04-11 MED ORDER — FENTANYL 2.5 MCG/ML BUPIVACAINE 1/10 % EPIDURAL INFUSION (WH - ANES)
14.0000 mL/h | INTRAMUSCULAR | Status: DC | PRN
Start: 2014-04-11 — End: 2014-04-13
  Administered 2014-04-11 – 2014-04-13 (×6): 14 mL/h via EPIDURAL
  Filled 2014-04-11 (×6): qty 125

## 2014-04-11 MED ORDER — LACTATED RINGERS IV SOLN
500.0000 mL | INTRAVENOUS | Status: DC | PRN
  Administered 2014-04-13: 500 mL via INTRAVENOUS

## 2014-04-11 MED ORDER — FENTANYL CITRATE 0.05 MG/ML IJ SOLN: 100.0000 ug | Freq: Once | INTRAMUSCULAR | Status: DC

## 2014-04-11 MED ORDER — LIDOCAINE HCL (PF) 1 % IJ SOLN
INTRAMUSCULAR | Status: DC | PRN
  Administered 2014-04-11 (×2): 4 mL

## 2014-04-11 MED ORDER — LACTATED RINGERS IV SOLN
500.0000 mL | Freq: Once | INTRAVENOUS | Status: AC
  Administered 2014-04-11: 500 mL via INTRAVENOUS

## 2014-04-11 MED ORDER — BETAMETHASONE SOD PHOS & ACET 6 (3-3) MG/ML IJ SUSP
12.0000 mg | Freq: Once | INTRAMUSCULAR | Status: DC
  Filled 2014-04-11: qty 2

## 2014-04-11 MED ORDER — CITRIC ACID-SODIUM CITRATE 334-500 MG/5ML PO SOLN
30.0000 mL | ORAL | Status: DC | PRN
  Administered 2014-04-13: 30 mL via ORAL
  Filled 2014-04-11: qty 15

## 2014-04-11 MED ORDER — ACETAMINOPHEN 325 MG PO TABS: 650.0000 mg | ORAL_TABLET | ORAL | Status: DC | PRN

## 2014-04-11 MED ORDER — DIPHENHYDRAMINE HCL 50 MG/ML IJ SOLN: 12.5000 mg | INTRAMUSCULAR | Status: DC | PRN

## 2014-04-11 MED ORDER — PHENYLEPHRINE 40 MCG/ML (10ML) SYRINGE FOR IV PUSH (FOR BLOOD PRESSURE SUPPORT): 80.0000 ug | PREFILLED_SYRINGE | INTRAVENOUS | Status: DC | PRN

## 2014-04-11 MED ORDER — FENTANYL 2.5 MCG/ML BUPIVACAINE 1/10 % EPIDURAL INFUSION (WH - ANES)
INTRAMUSCULAR | Status: DC | PRN
  Administered 2014-04-11: 14 mL/h via EPIDURAL

## 2014-04-11 MED ORDER — ONDANSETRON HCL 4 MG/2ML IJ SOLN
4.0000 mg | Freq: Four times a day (QID) | INTRAMUSCULAR | Status: DC | PRN
  Administered 2014-04-12: 4 mg via INTRAVENOUS
  Filled 2014-04-11: qty 2

## 2014-04-11 MED ORDER — FENTANYL CITRATE 0.05 MG/ML IJ SOLN
INTRAMUSCULAR | Status: AC
  Filled 2014-04-11: qty 2

## 2014-04-11 MED ORDER — OXYCODONE-ACETAMINOPHEN 5-325 MG PO TABS: 2.0000 | ORAL_TABLET | ORAL | Status: DC | PRN

## 2014-04-11 MED ORDER — OXYTOCIN 40 UNITS IN LACTATED RINGERS INFUSION - SIMPLE MED: 62.5000 mL/h | INTRAVENOUS | Status: DC

## 2014-04-11 MED ORDER — OXYTOCIN BOLUS FROM INFUSION: 500.0000 mL | INTRAVENOUS | Status: DC

## 2014-04-11 MED ORDER — OXYCODONE-ACETAMINOPHEN 5-325 MG PO TABS: 1.0000 | ORAL_TABLET | ORAL | Status: DC | PRN

## 2014-04-11 MED ORDER — OXYTOCIN 40 UNITS IN LACTATED RINGERS INFUSION - SIMPLE MED
INTRAVENOUS | Status: AC
  Filled 2014-04-11: qty 1000

## 2014-04-11 MED ORDER — FLEET ENEMA 7-19 GM/118ML RE ENEM: 1.0000 | ENEMA | RECTAL | Status: DC | PRN

## 2014-04-11 MED ORDER — LIDOCAINE HCL (PF) 1 % IJ SOLN: 30.0000 mL | INTRAMUSCULAR | Status: DC | PRN

## 2014-04-11 NOTE — MAU Provider Note (Signed)
  History     CSN: [id number]  Arrival date and time: 04/11/14 1730   None     Chief Complaint  Patient presents with  . Vaginal Bleeding   Patient is a 23 y.o. female presenting with vaginal bleeding. The history is provided by the patient.  Vaginal Bleeding This is a new problem. The current episode started today. The problem occurs intermittently. The problem has been gradually improving. Associated symptoms include abdominal pain (cramping).  Vaginal Bleeding This is a new problem. The current episode started today. The problem occurs intermittently. The problem has been gradually improving. Associated symptoms include abdominal pain (cramping) and frequency.   Michele Hayden is a 23 y.o. [id number] @ 7831w6d gestation who presents to the MAU with lower abdominal cramping and vaginal bleeding. The symptoms first started about 2 o'clock today as spotting and then about 2 hours later was like a heavy period. She states that she was not feeling the baby move as much. [facility]. Last sexual intercourse last night.  OB History   Grav Para Term Preterm Abortions TAB SAB Ect Mult Living   3 1 1  1 1    1       Past Medical History  Diagnosis Date  . Chlamydia infection complicating pregnancy in second trimester 04/10/2013    Past Surgical History  Procedure Laterality Date  . Wisdom tooth extraction    . Dilation and curettage of uterus      Family History  Problem Relation Age of Onset  . Asthma Brother   . Diabetes Paternal Grandmother   . Kidney disease Paternal Grandmother     History  Substance Use Topics  . Smoking status: Former Smoker    Types: Cigarettes    Quit date: 09/26/2012  . Smokeless tobacco: Never Used  . Alcohol Use: No    Allergies: No Known Allergies  Prescriptions prior to admission  Medication Sig Dispense Refill  . Prenatal Vit-Fe Fumarate-FA (PRENATAL MULTIVITAMIN) TABS tablet Take 1 tablet by mouth daily at 12 noon.         Review of Systems  Gastrointestinal: Positive for abdominal pain (cramping).  Genitourinary: Positive for frequency and vaginal bleeding.  all other systems negative  Physical Exam   Blood pressure 139/68, pulse 78, temperature 97.9 F (36.6 C), temperature source Oral, resp. rate 18, height 5\' 7"  (1.702 m), weight 230 lb (104.327 kg), SpO2 100.00%, not currently breastfeeding.  Physical Exam  Nursing note and vitals reviewed. Constitutional: She is oriented to person, place, and time. She appears well-developed and well-nourished. No distress.  HENT:  Head: Normocephalic and atraumatic.  Eyes: Conjunctivae and EOM are normal.  Neck: Neck supple.  Cardiovascular: Normal rate.   Respiratory: Effort normal.  GI: Soft. There is generalized tenderness.  Musculoskeletal: Normal range of motion.  Neurological: She is alert and oriented to person, place, and time.  Skin: Skin is warm and dry.  Psychiatric: She has a normal mood and affect. Her behavior is normal. Judgment and thought content normal.    MAU Course  Procedures Will send patient for limited ultrasound to evaluate placenta before doing pelvic exam.   Assessment and Plan  23 y.o. [id number] @ 3231w6d with generalized abdominal pain. Awaiting ultrasound. [facility].   NEESE,HOPE 04/11/2014, 7:34 PM

## 2014-04-11 NOTE — Anesthesia Procedure Notes (Signed)
Epidural Patient location during procedure: OB Start time: 04/11/2014 10:39 PM  Staffing Anesthesiologist: Marshall Kampf A.  Preanesthetic Checklist Completed: patient identified, site marked, surgical consent, pre-op evaluation, timeout performed, IV checked, risks and benefits discussed and monitors and equipment checked  Epidural Patient position: sitting Prep: site prepped and draped and DuraPrep Patient monitoring: continuous pulse ox and blood pressure Approach: midline Injection technique: LOR air  Needle:  Needle type: Tuohy  Needle gauge: 17 G Needle length: 9 cm and 9 Needle insertion depth: 8 cm Catheter type: closed end flexible Catheter size: 19 Gauge Catheter at skin depth: 14 cm Test dose: negative and Other  Assessment Events: blood not aspirated, injection not painful, no injection resistance, negative IV test and no paresthesia  Additional Notes Patient identified. Risks and benefits discussed including failed block, incomplete  Pain control, post dural puncture headache, nerve damage, paralysis, blood pressure Changes, nausea, vomiting, reactions to medications-both toxic and allergic and post Partum back pain. All questions were answered. Patient expressed understanding and wished to proceed. Sterile technique was used throughout procedure. Epidural site was Dressed with sterile barrier dressing. No paresthesias, signs of intravascular injection Or signs of intrathecal spread were encountered. Attempt x 2. Difficult due to poor positioning and MO. Patient was more comfortable after the epidural was dosed. Please see RN's note for documentation of vital signs and FHR which are stable.

## 2014-04-11 NOTE — H&P (Signed)
Michele Hayden is a 23 y.o. G3P1011 @ 6859w3d gestation who presents to the MAU with lower abdominal cramping and vaginal bleeding. The symptoms first started about 2 o'clock today as spotting and then about 2 hours later was like a heavy period. She states that she was not feeling the baby move as much. [facility]. Last sexual intercourse last night. Pt had an NSVD 6 months ago.  She had cervical shortening with that pregnancy but delivered at term.  Pt was aware she was pregnant ahd is dated by an 11 week US in the [facility] system (report in epic).  Maternal Medical History:  Reason for admission: Contractions and vaginal bleeding.   Contractions: Onset was 6-12 hours ago.   Frequency: regular.   Duration is approximately 1 minute.   Perceived severity is strong.    Fetal activity: Perceived fetal activity is decreased.   Last perceived fetal movement was within the past hour.      OB History   Grav Para Term Preterm Abortions TAB SAB Ect Mult Living   3 1 1  1 1    1      Past Medical History  Diagnosis Date  . Chlamydia infection complicating pregnancy in second trimester 04/10/2013   Past Surgical History  Procedure Laterality Date  . Wisdom tooth extraction    . Dilation and curettage of uterus     Family History: family history includes Asthma in her brother; Diabetes in her paternal grandmother; Kidney disease in her paternal grandmother. Social History:  reports that she quit smoking about 18 months ago. Her smoking use included Cigarettes. She smoked 0.00 packs per day. She has never used smokeless tobacco. She reports that she does not drink alcohol or use illicit drugs.   Prenatal Transfer Tool  Maternal Diabetes: No Genetic Screening: Declined Maternal Ultrasounds/Referrals: Declined--did not get prenatal care. Fetal Ultrasounds or other Referrals:  None Maternal Substance Abuse:  No Significant Maternal Medications:  None Significant Maternal Lab Results:   None Other Comments:  None  Review of Systems  Constitutional: Negative.   Eyes: Negative.   Respiratory: Negative.   Cardiovascular: Negative.   Gastrointestinal: Negative.       Blood pressure 139/68, pulse 78, temperature 97.9 F (36.6 C), temperature source Oral, resp. rate 18, height 5\' 7"  (1.702 m), weight 230 lb (104.327 kg), SpO2 100.00%, not currently breastfeeding. Maternal Exam:  Uterine Assessment: Contraction strength is moderate.  Contraction duration is 45 seconds. Contraction frequency is regular.   Abdomen: Fetal presentation: breech  Introitus: Normal vulva. Normal vagina.  Ferning test: not done.  Nitrazine test: not done. Amniotic fluid character: not assessed.  Pelvis: adequate for delivery.   Cervix: Cervix evaluated by digital exam.     Physical Exam  Vitals reviewed. Constitutional: She is oriented to person, place, and time. She appears well-developed and well-nourished. No distress.  HENT:  Head: Normocephalic and atraumatic.  Neck: Neck supple. Thyromegaly present.  Cardiovascular: Normal rate and regular rhythm.   Respiratory: Effort normal and breath sounds normal.  GI: Soft. There is no tenderness. There is no rebound.  Genitourinary:  Bulging membranes protruding to 3 cm above introitus.  No fetal parts felt.  Minimal spotting.  Musculoskeletal: She exhibits no edema.  Neurological: She is alert and oriented to person, place, and time.  Skin: Skin is warm and dry.  Psychiatric: She has a normal mood and affect.    Prenatal labs: ABO, Rh: --/--/O POS (03/24 0444) Antibody: NEG (  03/24 0444) Rubella:   RPR: NON REACTIVE (03/24 0255)  HBsAg:    HIV: NON REACTIVE (01/20 1335)  GBS: Negative (03/05 0000)   Assessment/Plan: 23 yo G3P1011 at 23 weeks 3 days in preterm labor.  Pt contracting every 3-4 minutes and painful.  NICU Dr. Mikle Boswortharlos came and spoke with patient.  Patient elects for no tocolysis, no steroids, no fetal monitoring, no c/s  and no neonatal resuscitation.  Pt would like an epidural for pain.  Fentanyl for pain until anesthesiologist can administer epidural.     Nayra Coury H. 04/11/2014, 9:56 PM

## 2014-04-11 NOTE — Anesthesia Preprocedure Evaluation (Addendum)
Anesthesia Evaluation  Patient identified by MRN, date of birth, ID band Patient awake    Reviewed: Allergy & Precautions, H&P , NPO status , Patient's Chart, lab work & pertinent test results  Airway Mallampati: III TM Distance: >3 FB Neck ROM: Full    Dental  (+) Teeth Intact   Pulmonary former smoker,  breath sounds clear to auscultation  Pulmonary exam normal       Cardiovascular negative cardio ROS  Rhythm:Regular Rate:Normal     Neuro/Psych negative neurological ROS     GI/Hepatic negative GI ROS, Neg liver ROS,   Endo/Other  Morbid obesity  Renal/GU negative Renal ROS     Musculoskeletal negative musculoskeletal ROS (+)   Abdominal (+) + obese,   Peds  Hematology negative hematology ROS (+) Thrombocytopenia- unknown etiology   Anesthesia Other Findings   Reproductive/Obstetrics (+) Pregnancy Pre term labor 23 weeks                          Anesthesia Physical Anesthesia Plan  ASA: II  Anesthesia Plan: Epidural   Post-op Pain Management:    Induction:   Airway Management Planned: Natural Airway  Additional Equipment:   Intra-op Plan:   Post-operative Plan:   Informed Consent: I have reviewed the patients History and Physical, chart, labs and discussed the procedure including the risks, benefits and alternatives for the proposed anesthesia with the patient or authorized representative who has indicated his/her understanding and acceptance.     Plan Discussed with: Anesthesiologist  Anesthesia Plan Comments:         Anesthesia Quick Evaluation

## 2014-04-11 NOTE — Consult Note (Signed)
Asked by Dr Jearld LeschLegget to speak to Ms Okonski to discuss outcome of preterm pregnancy at 423 3/7 weeks.  She has good dating by US at 11 wks (see Dr Brand MalesLegget's note). She came in active labor, and elects no tocolysis, no steroids, and no neonatal resuscitataion.   I spoke to her in her room twice. I discussed poor survival rate associated with [redacted] weeks gestation based on our statistics. Survivors at high risk for moderate to severe developmental problems. She confirmed she did not want intervention because she did not want the baby to have prolonged vent need and developmental problems. She chooses no resuscitation for the baby.   23 3/7 weeks in PTL. No resuscitation per mom's request. Provide comfort measures.  Lucillie Garfinkelita Q Verle Brillhart, MD  Neonatologist

## 2014-04-11 NOTE — MAU Note (Signed)
Pt states cramping type pain started about 1630 after vaginal bleeding started at 1400.  Bright red with passing a long stringy clot.   Pt states baby isn't moving like she did before.

## 2014-04-11 NOTE — MAU Note (Signed)
Pt. Brought back from ultra sound via wheelchair.  Pt. Placed in trendelenburg position due to open cervix and bulgy membranes, Dr. Penne LashLeggett notified.

## 2014-04-12 LAB — OB RESULTS CONSOLE GBS: STREP GROUP B AG: POSITIVE

## 2014-04-12 LAB — RAPID HIV SCREEN (WH-MAU): Rapid HIV Screen: NONREACTIVE

## 2014-04-12 LAB — GROUP B STREP BY PCR: Group B strep by PCR: POSITIVE — AB

## 2014-04-12 LAB — OB RESULTS CONSOLE HIV ANTIBODY (ROUTINE TESTING): HIV: NONREACTIVE

## 2014-04-12 MED ORDER — BETAMETHASONE SOD PHOS & ACET 6 (3-3) MG/ML IJ SUSP
12.0000 mg | INTRAMUSCULAR | Status: DC
  Administered 2014-04-12: 12 mg via INTRAMUSCULAR
  Filled 2014-04-12 (×2): qty 2

## 2014-04-12 MED ORDER — MAGNESIUM SULFATE 40 G IN LACTATED RINGERS - SIMPLE
2.0000 g/h | INTRAVENOUS | Status: DC
  Administered 2014-04-13: 2 g/h via INTRAVENOUS
  Filled 2014-04-12 (×2): qty 500

## 2014-04-12 MED ORDER — MAGNESIUM SULFATE BOLUS VIA INFUSION
6.0000 g | Freq: Once | INTRAVENOUS | Status: AC
  Administered 2014-04-12: 6 g via INTRAVENOUS
  Filled 2014-04-12: qty 500

## 2014-04-12 MED ORDER — ZOLPIDEM TARTRATE 5 MG PO TABS
10.0000 mg | ORAL_TABLET | Freq: Once | ORAL | Status: AC
  Administered 2014-04-12: 10 mg via ORAL
  Filled 2014-04-12: qty 2

## 2014-04-12 NOTE — Progress Notes (Signed)
Patient ID: Michele Hayden, female   DOB: 01/01/1991, 23 y.o.   MRN: [id number]  CTSP for wet pad with bloody show and possible amniotic fluid.  Pt with epidural; feeling a little more pressure.  FHTs 145 No ctx per toco  SSE: cx appears to be about 3cm/70 with bulging membranes; stretches open further with end of spec  Will continue with mag/epidural. Pt may have sips of clear liquids.  Cam HaiSHAW, KIMBERLY 04/12/2014 3:47 PM

## 2014-04-12 NOTE — Plan of Care (Signed)
Problem: Consults Goal: Chaplain Consult Outcome: Completed/Met Date Met:  04/12/14 Pt requests visit from the chaplain. Katie, chaplain in route to see pt at this time.

## 2014-04-12 NOTE — Progress Notes (Signed)
04/12/14 1700  Clinical Encounter Type  Visited With Patient and family together (friends)  Visit Type Follow-up;Spiritual support;Social support  Spiritual Encounters  Spiritual Needs Emotional   Followed up with [name] twice this pm to provide further support and encouragement.  She is feeling relieved that membranes have not ruptured and continues to hope for baby's safety and wellbeing.  She is aware of ongoing chaplain availability, but please also page as needs arise:  [phone].  Thank you.  [address]Chaplain Romir Klimowicz [place], [place] [phone]

## 2014-04-12 NOTE — Progress Notes (Signed)
[name] was tearful as she talked about making this difficult decision.  She is very clear for herself that she would not want to be on a ventilator and told her family long before this that they should let her go peacefully under that scenario.  She also is aware of the reality that her baby has a very small chance of surviving.  She feels clear at this point that she does not want measures taken, but she worries about not having her boyfriend here to help make that decision with her.  She stated that he is at home with their son who is sick today.  She also surmised that he might be using any excuse to not be here so that he does not have to face seeing the baby.  I encouraged her to tell him that she really wanted him here and helped her think of ways of including him via telephone if he could not be physically present.  She was going to call him after our visit.  She is aware that the doctors are also speaking about the situation and that there will be further conversation.  During the visit I noted aloud that I heard her weighing this decision as a parent with her baby's interests at heart. I have consulted with her nurse and will be available to her throughout the day as needs arise.  Please page, [phone].  Chaplain Dyanne CarrelKaty Jahnay Lantier 9:28 AM   04/12/14 0900  Clinical Encounter Type  Visited With Patient  Visit Type Spiritual support  Referral From Nurse  Spiritual Encounters  Spiritual Needs Emotional  Stress Factors  Patient Stress Factors (Difficult decisions)

## 2014-04-12 NOTE — Progress Notes (Addendum)
Pt now verbalizing that she is unsure of her decisions regarding interventions to maintain this pregnancy. This comes after speaking with fob on the phone, who wants to "fight for pregnancy" Pt wishes to speak with md at this time. Dr Macon LargeAnyanwu in seeing another patient but to call rn back

## 2014-04-12 NOTE — Progress Notes (Signed)
I offered additional emotional support to [name] and Michele Hayden as they spoke with the midwife.  Michele Hayden is anxious about her baby and anxious about the C-section as well, but they remain hopeful.  [name]'s mother told him today that he was born at 5 months.  That has completely changed their mind about how they want to proceed and they are now hoping to keep this baby in as long as possible.  We will continue to be a support to them throughout the day.  Please page as needs arise, [phone].  Chaplain Michele Hayden 12:48 PM   04/12/14 1200  Clinical Encounter Type  Visited With Patient and family together  Visit Type Spiritual support;Follow-up  Spiritual Encounters  Spiritual Needs Emotional

## 2014-04-12 NOTE — Progress Notes (Signed)
Patient ID: Michele Hayden, female   DOB: 11/15/1990, 23 y.o.   MRN: [id number]  CTSP as she and FOB have now decided to do all measures for infant resuscitation. She is comfortable with her epidural; feeling some mid abd pressure which is not new. No new symptomatology. Rev'd with pt her need for a classical C/S which would require her to deliver by C/S for all future pregnancies. Pt and FOB would like to speak with Neo about what to expect re the baby, but at this point would like to proceed with all measures to attempt to halt delivery.  I am unsure how long we will keep the epidural in place- will consult with Dr Macon LargeAnyanwu. Pt will be NPO if she is contracting (toco assessing now). Will start Mag Sulfate 6gm load then 2gm/hr and BMZ x 2 doses. NICU to be consulted.  Cam HaiSHAW, KIMBERLY 04/12/2014 12:39 PM

## 2014-04-12 NOTE — Plan of Care (Signed)
Problem: Consults Goal: Neonatologist Consult Outcome: Completed/Met Date Met:  04/12/14 Dr Tamala Julian at bedside giving nicu consult

## 2014-04-12 NOTE — Plan of Care (Signed)
Problem: Phase I Progression Outcomes Goal: OOB as tolerated unless otherwise ordered Outcome: Completed/Met Date Met:  04/12/14 Pt with epidural, instructed not to get oob, pt verbalizes understanding, fall band and sign in place

## 2014-04-12 NOTE — Progress Notes (Signed)
Pt discussed plan of care with Dr. Penne LashLeggett, no changes made. Continuing current plan of care.

## 2014-04-13 ENCOUNTER — Encounter (HOSPITAL_COMMUNITY)
Admission: AD | Disposition: A | Discharge status: Home / Self Care | Payer: Self-pay | Source: Ambulatory Visit | Attending: Obstetrics & Gynecology

## 2014-04-13 ENCOUNTER — Encounter (HOSPITAL_COMMUNITY): Payer: Self-pay

## 2014-04-13 DIAGNOSIS — O209 Hemorrhage in early pregnancy, unspecified: Secondary | ICD-10-CM

## 2014-04-13 DIAGNOSIS — O4702 False labor before 37 completed weeks of gestation, second trimester: Secondary | ICD-10-CM

## 2014-04-13 DIAGNOSIS — Z3A23 23 weeks gestation of pregnancy: Secondary | ICD-10-CM

## 2014-04-13 DIAGNOSIS — O4592 Premature separation of placenta, unspecified, second trimester: Secondary | ICD-10-CM

## 2014-04-13 DIAGNOSIS — O411220 Chorioamnionitis, second trimester, not applicable or unspecified: Secondary | ICD-10-CM

## 2014-04-13 LAB — CBC
HEMATOCRIT: 31.9 % — AB (ref 36.0–46.0)
HEMATOCRIT: 25.8 % — AB (ref 36.0–46.0)
HEMOGLOBIN: 8.8 g/dL — AB (ref 12.0–15.0)
Hemoglobin: 10.9 g/dL — ABNORMAL LOW (ref 12.0–15.0)
MCH: 30.7 pg (ref 26.0–34.0)
MCH: 30.4 pg (ref 26.0–34.0)
MCHC: 34.2 g/dL (ref 30.0–36.0)
MCHC: 33.7 g/dL (ref 30.0–36.0)
MCV: 89.9 fL (ref 78.0–100.0)
MCV: 90.2 fL (ref 78.0–100.0)
PLATELETS: 95 10*3/uL — AB (ref 150–400)
Platelets: 85 10*3/uL — ABNORMAL LOW (ref 150–400)
RBC: 3.55 MIL/uL — ABNORMAL LOW (ref 3.87–5.11)
RBC: 2.86 MIL/uL — AB (ref 3.87–5.11)
RDW: 14.2 % (ref 11.5–15.5)
RDW: 14.2 % (ref 11.5–15.5)
WBC: 16.6 10*3/uL — ABNORMAL HIGH (ref 4.0–10.5)
WBC: 15.3 10*3/uL — ABNORMAL HIGH (ref 4.0–10.5)

## 2014-04-13 LAB — CBC WITH DIFFERENTIAL/PLATELET
Basophils Absolute: 0 10*3/uL (ref 0.0–0.1)
Basophils Relative: 0 % (ref 0–1)
Eosinophils Absolute: 0 10*3/uL (ref 0.0–0.7)
Eosinophils Relative: 0 % (ref 0–5)
HCT: 25.1 % — ABNORMAL LOW (ref 36.0–46.0)
Hemoglobin: 8.6 g/dL — ABNORMAL LOW (ref 12.0–15.0)
LYMPHS ABS: 0.9 10*3/uL (ref 0.7–4.0)
Lymphocytes Relative: 8 % — ABNORMAL LOW (ref 12–46)
MCH: 30.9 pg (ref 26.0–34.0)
MCHC: 34.3 g/dL (ref 30.0–36.0)
MCV: 90.3 fL (ref 78.0–100.0)
MONO ABS: 0.8 10*3/uL (ref 0.1–1.0)
Monocytes Relative: 7 % (ref 3–12)
NEUTROS PCT: 85 % — AB (ref 43–77)
Neutro Abs: 10 10*3/uL — ABNORMAL HIGH (ref 1.7–7.7)
Platelets: 84 10*3/uL — ABNORMAL LOW (ref 150–400)
RBC: 2.78 MIL/uL — ABNORMAL LOW (ref 3.87–5.11)
RDW: 14.2 % (ref 11.5–15.5)
WBC Morphology: INCREASED
WBC: 11.7 10*3/uL — ABNORMAL HIGH (ref 4.0–10.5)

## 2014-04-13 LAB — RPR

## 2014-04-13 LAB — HEPATITIS B SURFACE ANTIGEN: Hepatitis B Surface Ag: NEGATIVE

## 2014-04-13 LAB — PREPARE RBC (CROSSMATCH)

## 2014-04-13 LAB — AMNISURE RUPTURE OF MEMBRANE (ROM) NOT AT ARMC: AMNISURE: POSITIVE

## 2014-04-13 LAB — RUBELLA SCREEN: RUBELLA: 11.3 {index} — AB (ref <0.90)

## 2014-04-13 SURGERY — Surgical Case
Anesthesia: Epidural | Site: Abdomen

## 2014-04-13 MED ORDER — WITCH HAZEL-GLYCERIN EX PADS: 1.0000 "application " | MEDICATED_PAD | CUTANEOUS | Status: DC | PRN

## 2014-04-13 MED ORDER — NALBUPHINE HCL 10 MG/ML IJ SOLN: 5.0000 mg | Freq: Once | INTRAMUSCULAR | Status: AC | PRN

## 2014-04-13 MED ORDER — PRENATAL MULTIVITAMIN CH
1.0000 | ORAL_TABLET | Freq: Every day | ORAL | Status: DC
  Administered 2014-04-14 – 2014-04-16 (×3): 1 via ORAL
  Filled 2014-04-13 (×3): qty 1

## 2014-04-13 MED ORDER — CLINDAMYCIN PHOSPHATE 900 MG/50ML IV SOLN: 900.0000 mg | Freq: Three times a day (TID) | INTRAVENOUS | Status: DC

## 2014-04-13 MED ORDER — SCOPOLAMINE 1 MG/3DAYS TD PT72
MEDICATED_PATCH | TRANSDERMAL | Status: AC
  Filled 2014-04-13: qty 1

## 2014-04-13 MED ORDER — ONDANSETRON HCL 4 MG/2ML IJ SOLN: 4.0000 mg | INTRAMUSCULAR | Status: DC | PRN

## 2014-04-13 MED ORDER — LIDOCAINE HCL 1 % IJ SOLN
INTRAMUSCULAR | Status: AC
  Filled 2014-04-13: qty 20

## 2014-04-13 MED ORDER — SODIUM CHLORIDE 0.9 % IJ SOLN: 3.0000 mL | INTRAMUSCULAR | Status: DC | PRN

## 2014-04-13 MED ORDER — FENTANYL CITRATE 0.05 MG/ML IJ SOLN
INTRAMUSCULAR | Status: AC
  Filled 2014-04-13: qty 2

## 2014-04-13 MED ORDER — SIMETHICONE 80 MG PO CHEW
80.0000 mg | CHEWABLE_TABLET | ORAL | Status: DC
  Administered 2014-04-13 – 2014-04-16 (×3): 80 mg via ORAL
  Filled 2014-04-13 (×3): qty 1

## 2014-04-13 MED ORDER — OXYCODONE HCL 5 MG PO TABS: 5.0000 mg | ORAL_TABLET | Freq: Once | ORAL | Status: DC | PRN

## 2014-04-13 MED ORDER — SIMETHICONE 80 MG PO CHEW
80.0000 mg | CHEWABLE_TABLET | Freq: Three times a day (TID) | ORAL | Status: DC
  Administered 2014-04-14 – 2014-04-16 (×9): 80 mg via ORAL
  Filled 2014-04-13 (×9): qty 1

## 2014-04-13 MED ORDER — PHENYLEPHRINE 40 MCG/ML (10ML) SYRINGE FOR IV PUSH (FOR BLOOD PRESSURE SUPPORT)
PREFILLED_SYRINGE | INTRAVENOUS | Status: AC
  Filled 2014-04-13: qty 10

## 2014-04-13 MED ORDER — KETOROLAC TROMETHAMINE 30 MG/ML IJ SOLN: 30.0000 mg | Freq: Four times a day (QID) | INTRAMUSCULAR | Status: DC | PRN

## 2014-04-13 MED ORDER — MENTHOL 3 MG MT LOZG: 1.0000 | LOZENGE | OROMUCOSAL | Status: DC | PRN

## 2014-04-13 MED ORDER — CEFAZOLIN SODIUM-DEXTROSE 2-3 GM-% IV SOLR
INTRAVENOUS | Status: DC | PRN
  Administered 2014-04-13: 2 g via INTRAVENOUS

## 2014-04-13 MED ORDER — DIPHENHYDRAMINE HCL 50 MG/ML IJ SOLN: 12.5000 mg | INTRAMUSCULAR | Status: DC | PRN

## 2014-04-13 MED ORDER — LIDOCAINE-EPINEPHRINE (PF) 2 %-1:200000 IJ SOLN
INTRAMUSCULAR | Status: AC
  Filled 2014-04-13: qty 20

## 2014-04-13 MED ORDER — SODIUM BICARBONATE 8.4 % IV SOLN
INTRAVENOUS | Status: DC | PRN
  Administered 2014-04-13 (×2): 10 mL via EPIDURAL

## 2014-04-13 MED ORDER — OXYTOCIN 10 UNIT/ML IJ SOLN
40.0000 [IU] | INTRAMUSCULAR | Status: DC | PRN
  Administered 2014-04-13: 40 [IU] via INTRAVENOUS

## 2014-04-13 MED ORDER — BUPIVACAINE HCL (PF) 0.5 % IJ SOLN
INTRAMUSCULAR | Status: AC
  Filled 2014-04-13: qty 30

## 2014-04-13 MED ORDER — SENNOSIDES-DOCUSATE SODIUM 8.6-50 MG PO TABS
2.0000 | ORAL_TABLET | ORAL | Status: DC
  Administered 2014-04-13 – 2014-04-16 (×3): 2 via ORAL
  Filled 2014-04-13 (×3): qty 2

## 2014-04-13 MED ORDER — HYDROMORPHONE HCL 1 MG/ML IJ SOLN
0.2000 mg | INTRAMUSCULAR | Status: DC | PRN
Start: 2014-04-13 — End: 2014-04-14
  Administered 2014-04-13: 0.4 mg via INTRAVENOUS

## 2014-04-13 MED ORDER — OXYCODONE-ACETAMINOPHEN 5-325 MG PO TABS
1.0000 | ORAL_TABLET | ORAL | Status: DC | PRN
  Administered 2014-04-16 (×2): 1 via ORAL
  Filled 2014-04-13: qty 1

## 2014-04-13 MED ORDER — MORPHINE SULFATE (PF) 0.5 MG/ML IJ SOLN
INTRAMUSCULAR | Status: DC | PRN
  Administered 2014-04-13: 3 mg via EPIDURAL
  Administered 2014-04-13: 2 mg via INTRAVENOUS

## 2014-04-13 MED ORDER — SIMETHICONE 80 MG PO CHEW: 80.0000 mg | CHEWABLE_TABLET | ORAL | Status: DC | PRN

## 2014-04-13 MED ORDER — DIPHENHYDRAMINE HCL 25 MG PO CAPS: 25.0000 mg | ORAL_CAPSULE | Freq: Four times a day (QID) | ORAL | Status: DC | PRN

## 2014-04-13 MED ORDER — ONDANSETRON HCL 4 MG PO TABS: 4.0000 mg | ORAL_TABLET | ORAL | Status: DC | PRN

## 2014-04-13 MED ORDER — MORPHINE SULFATE 0.5 MG/ML IJ SOLN
INTRAMUSCULAR | Status: AC
  Filled 2014-04-13: qty 10

## 2014-04-13 MED ORDER — PHENYLEPHRINE HCL 10 MG/ML IJ SOLN
INTRAMUSCULAR | Status: DC | PRN
  Administered 2014-04-13 (×7): 40 ug via INTRAVENOUS

## 2014-04-13 MED ORDER — TETANUS-DIPHTH-ACELL PERTUSSIS 5-2.5-18.5 LF-MCG/0.5 IM SUSP: 0.5000 mL | Freq: Once | INTRAMUSCULAR | Status: DC

## 2014-04-13 MED ORDER — SODIUM CHLORIDE 0.9 % IV SOLN: Freq: Once | INTRAVENOUS | Status: DC

## 2014-04-13 MED ORDER — HYDROMORPHONE HCL 1 MG/ML IJ SOLN
0.2000 mg | INTRAMUSCULAR | Status: DC | PRN
  Filled 2014-04-13: qty 1

## 2014-04-13 MED ORDER — OXYCODONE-ACETAMINOPHEN 5-325 MG PO TABS
2.0000 | ORAL_TABLET | ORAL | Status: DC | PRN
  Administered 2014-04-13 – 2014-04-16 (×9): 2 via ORAL
  Filled 2014-04-13 (×10): qty 2

## 2014-04-13 MED ORDER — INFLUENZA VAC SPLIT QUAD 0.5 ML IM SUSY: 0.5000 mL | PREFILLED_SYRINGE | Freq: Once | INTRAMUSCULAR | Status: DC

## 2014-04-13 MED ORDER — ZOLPIDEM TARTRATE 5 MG PO TABS: 5.0000 mg | ORAL_TABLET | Freq: Every evening | ORAL | Status: DC | PRN

## 2014-04-13 MED ORDER — MEPERIDINE HCL 25 MG/ML IJ SOLN
6.2500 mg | INTRAMUSCULAR | Status: DC | PRN
  Administered 2014-04-13: 6.25 mg via INTRAVENOUS

## 2014-04-13 MED ORDER — IBUPROFEN 600 MG PO TABS
600.0000 mg | ORAL_TABLET | Freq: Four times a day (QID) | ORAL | Status: DC
  Administered 2014-04-13 – 2014-04-14 (×3): 600 mg via ORAL
  Filled 2014-04-13 (×3): qty 1

## 2014-04-13 MED ORDER — DIPHENHYDRAMINE HCL 25 MG PO CAPS: 25.0000 mg | ORAL_CAPSULE | ORAL | Status: DC | PRN

## 2014-04-13 MED ORDER — DEXTROSE 5 % IV SOLN
1.0000 ug/kg/h | INTRAVENOUS | Status: DC | PRN
  Filled 2014-04-13: qty 2

## 2014-04-13 MED ORDER — LANOLIN HYDROUS EX OINT: 1.0000 "application " | TOPICAL_OINTMENT | CUTANEOUS | Status: DC | PRN

## 2014-04-13 MED ORDER — ONDANSETRON HCL 4 MG/2ML IJ SOLN
INTRAMUSCULAR | Status: DC | PRN
  Administered 2014-04-13: 4 mg via INTRAVENOUS

## 2014-04-13 MED ORDER — 0.9 % SODIUM CHLORIDE (POUR BTL) OPTIME
TOPICAL | Status: DC | PRN
  Administered 2014-04-13: 1000 mL

## 2014-04-13 MED ORDER — NALBUPHINE HCL 10 MG/ML IJ SOLN: 5.0000 mg | INTRAMUSCULAR | Status: DC | PRN

## 2014-04-13 MED ORDER — FENTANYL CITRATE 0.05 MG/ML IJ SOLN
25.0000 ug | INTRAMUSCULAR | Status: DC | PRN
  Administered 2014-04-13 (×4): 50 ug via INTRAVENOUS

## 2014-04-13 MED ORDER — PHENYLEPHRINE 8 MG IN D5W 100 ML (0.08MG/ML) PREMIX OPTIME
INJECTION | INTRAVENOUS | Status: DC | PRN
  Administered 2014-04-13: 50 ug/min via INTRAVENOUS

## 2014-04-13 MED ORDER — EPHEDRINE 5 MG/ML INJ
INTRAVENOUS | Status: AC
  Filled 2014-04-13: qty 10

## 2014-04-13 MED ORDER — ONDANSETRON HCL 4 MG/2ML IJ SOLN: 4.0000 mg | Freq: Three times a day (TID) | INTRAMUSCULAR | Status: DC | PRN

## 2014-04-13 MED ORDER — DIBUCAINE 1 % RE OINT: 1.0000 "application " | TOPICAL_OINTMENT | RECTAL | Status: DC | PRN

## 2014-04-13 MED ORDER — GENTAMICIN SULFATE 40 MG/ML IJ SOLN
Freq: Three times a day (TID) | INTRAVENOUS | Status: DC
  Administered 2014-04-13 (×2): via INTRAVENOUS
  Filled 2014-04-13 (×3): qty 4.25

## 2014-04-13 MED ORDER — OXYCODONE HCL 5 MG/5ML PO SOLN: 5.0000 mg | Freq: Once | ORAL | Status: DC | PRN

## 2014-04-13 MED ORDER — PHENYLEPHRINE 40 MCG/ML (10ML) SYRINGE FOR IV PUSH (FOR BLOOD PRESSURE SUPPORT)
PREFILLED_SYRINGE | INTRAVENOUS | Status: AC
  Filled 2014-04-13: qty 5

## 2014-04-13 MED ORDER — EPHEDRINE SULFATE 50 MG/ML IJ SOLN
INTRAMUSCULAR | Status: DC | PRN
  Administered 2014-04-13 (×3): 5 mg via INTRAVENOUS

## 2014-04-13 MED ORDER — SCOPOLAMINE 1 MG/3DAYS TD PT72
1.0000 | MEDICATED_PATCH | Freq: Once | TRANSDERMAL | Status: AC
  Administered 2014-04-13: 1.5 mg via TRANSDERMAL

## 2014-04-13 MED ORDER — LACTATED RINGERS IV SOLN
INTRAVENOUS | Status: DC
  Administered 2014-04-13: 22:00:00 via INTRAVENOUS

## 2014-04-13 MED ORDER — OXYTOCIN 40 UNITS IN LACTATED RINGERS INFUSION - SIMPLE MED: 62.5000 mL/h | INTRAVENOUS | Status: AC

## 2014-04-13 MED ORDER — NALOXONE HCL 0.4 MG/ML IJ SOLN: 0.4000 mg | INTRAMUSCULAR | Status: DC | PRN

## 2014-04-13 MED ORDER — CEFAZOLIN SODIUM-DEXTROSE 2-3 GM-% IV SOLR
INTRAVENOUS | Status: AC
  Filled 2014-04-13: qty 50

## 2014-04-13 MED ORDER — MEPERIDINE HCL 25 MG/ML IJ SOLN
INTRAMUSCULAR | Status: AC
  Filled 2014-04-13: qty 1

## 2014-04-13 SURGICAL SUPPLY — 38 items
APL SKNCLS STERI-STRIP NONHPOA (GAUZE/BANDAGES/DRESSINGS) ×1
BENZOIN TINCTURE PRP APPL 2/3 (GAUZE/BANDAGES/DRESSINGS) ×3 IMPLANT
CLAMP CORD UMBIL (MISCELLANEOUS) IMPLANT
CLOSURE WOUND 1/2 X4 (GAUZE/BANDAGES/DRESSINGS) ×1
CLOTH BEACON ORANGE TIMEOUT ST (SAFETY) ×3 IMPLANT
DRAPE SHEET LG 3/4 BI-LAMINATE (DRAPES) IMPLANT
DRSG OPSITE POSTOP 4X10 (GAUZE/BANDAGES/DRESSINGS) ×3 IMPLANT
DURAPREP 26ML APPLICATOR (WOUND CARE) ×3 IMPLANT
ELECT REM PT RETURN 9FT ADLT (ELECTROSURGICAL) ×3
ELECTRODE REM PT RTRN 9FT ADLT (ELECTROSURGICAL) ×1 IMPLANT
EXTRACTOR VACUUM KIWI (MISCELLANEOUS) IMPLANT
FLOSEAL 10ML (HEMOSTASIS) ×3 IMPLANT
GLOVE BIO SURGEON ST LM GN SZ9 (GLOVE) ×3 IMPLANT
GLOVE BIOGEL PI IND STRL 9 (GLOVE) ×1 IMPLANT
GLOVE BIOGEL PI INDICATOR 9 (GLOVE) ×2
GOWN STRL REUS W/TWL 2XL LVL3 (GOWN DISPOSABLE) ×3 IMPLANT
GOWN STRL REUS W/TWL LRG LVL3 (GOWN DISPOSABLE) ×3 IMPLANT
KIT ABG SYR 3ML LUER SLIP (SYRINGE) ×3 IMPLANT
NEEDLE HYPO 25X1 1.5 SAFETY (NEEDLE) ×3 IMPLANT
NEEDLE HYPO 25X5/8 SAFETYGLIDE (NEEDLE) ×3 IMPLANT
NS IRRIG 1000ML POUR BTL (IV SOLUTION) ×6 IMPLANT
PACK C SECTION WH (CUSTOM PROCEDURE TRAY) ×3 IMPLANT
PAD OB MATERNITY 4.3X12.25 (PERSONAL CARE ITEMS) ×3 IMPLANT
RTRCTR C-SECT PINK 25CM LRG (MISCELLANEOUS) ×3 IMPLANT
RTRCTR C-SECT PINK 34CM XLRG (MISCELLANEOUS) IMPLANT
STRIP CLOSURE SKIN 1/2X4 (GAUZE/BANDAGES/DRESSINGS) ×2 IMPLANT
SUT MNCRL 0 VIOLET CTX 36 (SUTURE) ×2 IMPLANT
SUT MONOCRYL 0 CTX 36 (SUTURE) ×4
SUT VIC AB 0 CT1 27 (SUTURE) ×3
SUT VIC AB 0 CT1 27XBRD ANBCTR (SUTURE) ×1 IMPLANT
SUT VIC AB 2-0 CT1 27 (SUTURE) ×6
SUT VIC AB 2-0 CT1 TAPERPNT 27 (SUTURE) ×2 IMPLANT
SUT VIC AB 4-0 KS 27 (SUTURE) ×3 IMPLANT
SYR BULB IRRIGATION 50ML (SYRINGE) IMPLANT
SYR CONTROL 10ML LL (SYRINGE) ×3 IMPLANT
TOWEL OR 17X24 6PK STRL BLUE (TOWEL DISPOSABLE) ×6 IMPLANT
TRAY FOLEY CATH 14FR (SET/KITS/TRAYS/PACK) ×3 IMPLANT
WATER STERILE IRR 1000ML POUR (IV SOLUTION) ×3 IMPLANT

## 2014-04-13 NOTE — Progress Notes (Signed)
Michele Hayden is a 23 y.o. [id number] at 8282w5d she is admitted for preterm labor. Since rounds this morning she is developed slow leakage of fluid with malodor suggestive of developing chorioamnionitis. She's on antibiotics, with white count 16,600, nontender uterus, but epidural is in place, and fetal heart rate is in the normal range still. Ultrasound by me at 10 AM showed the baby was breech. The patient was again counseled over the details of probable cesarean delivery versus vaginal delivery if she progressed in dilation with the pros and cons discussed in detail. Continuous fetal monitoring which of tachycardia and early temperature check in place . Subjective: At approximately 11:45 AM I was called to the room for heavy vaginal bleeding, where fetal heart rate tracing showed the heart rate normal range having had a couple of variable decelerations recently, exam showed that the patient had saturated the pad underneath her with an estimated 750 cc blood loss. Digital exam showed the patient to be 6 cm stretchable -2 fetal small parts in the cervix, but not accessible for extraction, still in the minus station taken to the operating for cesarean section in an urgent fashion due to abruption, and chorioamnionitis, as malodor was significant. Objective: BP 127/54  Pulse 80  Temp(Src) 98.9 F (37.2 C) (Axillary)  Resp 18  Ht 5\' 7"  (1.702 m)  Wt 104.327 kg (230 lb)  BMI 36.01 kg/m2  SpO2 97%  Breastfeeding? Unknown I/O last 3 completed shifts: In: 3665.4 [P.O.:1310; I.V.:2355.4] Out: 3240 [Urine:3240] Total I/O In: 3847.1 [P.O.:300; I.V.:3547.1] Out: 2415 [Urine:1215; Blood:1200]  FHT:  FHR: 150 bpm, variability: minimal ,  accelerations:  Abscent,  decelerations:  Present Variables in the last hour before delivery UC:   Unable to document SVE:   Dilation: 8 Effacement (%): 90 Station: 0 Exam by:: Loana Salvaggio Exam 6 cm stretchable,-1 -2 station. Gross heavy bleeding occurring, 750 cc in  bed, due to heavy severity of bleeding due to suspected abruption will need to proceed to cesarean delivery   jvf Labs: Lab Results  Component Value Date   WBC 16.6* 04/13/2014   HGB 10.9* 04/13/2014   HCT 31.9* 04/13/2014   MCV 89.9 04/13/2014   PLT 95* 04/13/2014    Assessment / Plan: Abruption of placenta, chorioamnionitis at 23 weeks viable proceed urgently to cesarean delivery and epidural in place  Anticipated MOD:  To or for cesarean  Marcedes Tech V note completed post cesarean procedure  04/13/2014, 1:25 PM

## 2014-04-13 NOTE — Anesthesia Postprocedure Evaluation (Signed)
  Anesthesia Post-op Note  Patient: Michele Hayden  Procedure(s) Performed: Procedure(s): CESAREAN SECTION (N/A)  Patient Location: PACU  Anesthesia Type:Epidural  Level of Consciousness: awake and alert   Airway and Oxygen Therapy: Patient Spontanous Breathing  Post-op Pain: mild  Post-op Assessment: Post-op Vital signs reviewed, Patient's Cardiovascular Status Stable, Respiratory Function Stable, Patent Airway, No signs of Nausea or vomiting and Pain level controlled  Post-op Vital Signs: Reviewed and stable  Last Vitals:  Filed Vitals:   04/13/14 1815  BP: 112/80  Pulse: 80  Temp: 37.1 C  Resp: 18    Complications: No apparent anesthesia complications

## 2014-04-13 NOTE — Transfer of Care (Signed)
Immediate Anesthesia Transfer of Care Note  Patient: Michele Hayden  Procedure(s) Performed: Procedure(s): CESAREAN SECTION (N/A)  Patient Location: PACU  Anesthesia Type:Epidural  Level of Consciousness: awake and alert   Airway & Oxygen Therapy: Patient Spontanous Breathing  Post-op Assessment: Report given to PACU RN and Post -op Vital signs reviewed and stable  Post vital signs: Reviewed and stable  Complications: No apparent anesthesia complications

## 2014-04-13 NOTE — Plan of Care (Signed)
Problem: Phase I Progression Outcomes Goal: Assess per MD/Nurse,Routine-VS,FHR,UC,Head to Toe assess Outcome: Progressing Pt palpating warm to touch. Afebrile. Will continue to monitor. Lab work placed from provider.

## 2014-04-13 NOTE — Progress Notes (Signed)
Patient was lying in bed and awake.  No one else was in the room.  Her [place] from [place].  Her mother from [place] (The parents are not together).    She talked freely and openly.  In her choice of topics, and her face and voice she evidenced a raised level of anxiety.  She even voiced being afraid of the delivery, even though she has given birth to what is now a six-month old boy.    Chaplain just listened as she rambled on.  We spoke the alternatives of this birth (e.g. live or not). She seemed OK voicing the two possibilities.  While she evidenced the anxiety, it was wholly understandable given her circumstances.  Rema Jasmineichard Deronte Solis, Chaplain Pager: [phone]

## 2014-04-13 NOTE — Progress Notes (Signed)
Discussed pt status with both providers. Included FHR tracing with variables, UCs, pt warm to touch, foul odor, last labs collected >48 hours ago with last platelet ct, and increased PPH status to high based on assessment findings. Orders given for labs and to hold PRBCs.

## 2014-04-13 NOTE — Op Note (Signed)
04/11/2014 - 04/13/2014  1:48 PM  PATIENT:  Michele Hayden  23 y.o. female  PRE-OPERATIVE DIAGNOSIS:  possible abruption  POST-OPERATIVE DIAGNOSIS:  posterior placental abruption, chorioamnioitis  PROCEDURE:  Procedure(s): CESAREAN SECTION (N/A), lower uterine segment vertical Probable extension of the vertical incision into or through the cervix. The cervix could not be palpated from the abdomen. Patient should be monitored for cervical healing, with consideration given for prophylactic cerclage for any future pregnancies  SURGEON:  Surgeon(s) and Role:    * Tilda BurrowJohn Taraji Mungo V, MD - Primary  PHYSICIAN ASSISTANT:   ASSISTANTS: Blackwell CST   ANESTHESIA:   epidural  EBL:  Total I/O In: 3847.1 [P.O.:300; I.V.:3547.1] Out: 2415 [Urine:1215; Blood:1200]  BLOOD ADMINISTERED:none  DRAINS: Urinary Catheter (Foley)   LOCAL MEDICATIONS USED:  NONE  SPECIMEN:  Source of Specimen:  Placenta to pathology  DISPOSITION OF SPECIMEN:  PATHOLOGY  COUNTS:  YES  TOURNIQUET:  * No tourniquets in log *  DICTATION: .Dragon Dictation  PLAN OF CARE: As admission orders  PATIENT DISPOSITION:  PACU - hemodynamically stable.   Delay start of Pharmacological VTE agent (>24hrs) due to surgical blood loss or risk of bleeding: not applicable Indication 23 week 5 day gestation with ruptured membranes this morning confirmed with positive Amnisure who began develop heavy bleeding consistent with abruption, examined found to be 6 cm with presenting part too high up to be able to access vaginally, taken to the operating room for urgent cesarean section  Details of procedure: Patient was taken promptly to record 9, prepped and draped for lower abdominal surgery with fetal heart rate tracing confirmed in the operating room and is in the 150 range. Epidural took effect, transverse lower abdominal incision performed in standard fashion with sharp dissection through the fascia, splitting the muscles the  midline and bluntly into the peritoneal cavity. The bladder flap was quite high on the uterus, lateral flap was developed, Alexis wound retractor positioned and lower uterine segment vertical incision made at the level of the bladder flap attached, with bandage scissors use x1 superiorly, and inferiorly fetal vertex could be identified in the uterus and the vertex was guided from the uterine fundus into the incision and the baby was delivered head first. Cord blood samples were obtained including a blood gas which is documented elsewhere. Cord blood pH was obtained. The placenta delivered in response to coud uterine massage, was somewhat irregular and macerated from the placental delivery process. There was evidence evidence of ecchymosis along the posterior Inferior edge of the placenta consistent with partial abruption. The uterus was irrigated out and swabbed with a swab. No membrane remnants could be identified. The uterus was quite small. The visit the incision could be identified with the inferior margin and the upper and the incision grasped with ring forceps. Efforts to palpate the cervix could not identify the cervical tissues as evidenced apparently melted into the adjacent tissues with the cervical dilation process. The incision extended past  the tissue that was felt to represent the cervix, a distance of approximately 2 cm the Foley bulb could be felt well below the inferior margin of the incision, through the Alexis wound retractor. The lower uterine segment vertical incision was closed in a running locking 0 Monocryl first layer and oversewn with a running 0 Monocryl layer. On or 2 figure-of-eight sutures were necessary to complete hemostasis. The urine was clear and generous throughout the case. The Foley bulb could be mobilized up into the bladder dome and moved  around to ensure that there was no disruption of the bladder dome. Similarly there was no suspicion that the bladder was affected by the  vertical incision closure  urine remained clear and generous throughout the case.   The bladder was not felt to be at risk during this procedure,, as it had been held well out of the way by the [facility] retractor. bladder flap was tacked loosely back in place with 2 interrupted sutures of 2-0 Vicryl. Pelvis was copiously irrigated anterior peritoneum was closed with running 2-0 Vicryl. The fascia was irrigated, closed with continuous running 0 Vicryl, and subcutaneous tissues hemostatic, irrigated again, and it approximated with 3 stitches of interrupted 2-0 Vicryl followed by subcuticular 4-0 Vicryl closure of skin incision. The patient will be continued on antibiotics for 24 hours, gentamicin and Cleocin

## 2014-04-13 NOTE — Progress Notes (Signed)
Patient ID: Michele Hayden, female   DOB: 03/26/1991, 23 y.o.   MRN: [id number] FACULTY PRACTICE ANTEPARTUM NOTE  Michele Hayden is a 23 y.o. G3P1011 at 7257w5d  who is admitted for Preterm labor.   Fetal presentation is transverse. Length of Stay:  2  Days  Subjective:  Patient reports good fetal movement.  She has an epidural and is comfortable.  She reports no uterine contractions, no bleeding and no loss of fluid per vagina.  Denies pain and pressure.  No abdominal pain  Vitals:  Blood pressure 118/62, pulse 66, temperature 98 F (36.7 C), temperature source Oral, resp. rate 18, height 5\' 7"  (1.702 m), weight 104.327 kg (230 lb), SpO2 97.00%, not currently breastfeeding. Physical Examination:  General appearance - alert, well appearing, and in no distress Chest - clear to auscultation, no wheezes, rales or rhonchi, symmetric air entry Heart - normal rate, regular rhythm, normal S1, S2, no murmurs, rubs, clicks or gallops Abdomen - soft, nontender, nondistended, no masses or organomegaly Fundal Height:  size equals dates Extremities: extremities normal, atraumatic, no cyanosis or edema  Membranes:intact  Fetal Monitoring:  Baseline: 130 bpm, Variability: Good {> 6 bpm), Accelerations: Non-reactive but appropriate for gestational age and Decelerations: Absent  Labs:  Results for orders placed during the hospital encounter of 04/11/14 (from the past 24 hour(s))  GROUP B STREP BY PCR   Collection Time    04/12/14  4:09 PM      Result Value Ref Range   Group B strep by PCR POSITIVE (*) NEGATIVE  HEPATITIS B SURFACE ANTIGEN   Collection Time    04/12/14  8:10 PM      Result Value Ref Range   Hepatitis B Surface Ag NEGATIVE  NEGATIVE  RPR   Collection Time    04/12/14  8:10 PM      Result Value Ref Range   RPR NON REAC  NON REAC  RAPID HIV SCREEN ([facility])   Collection Time    04/12/14  8:10 PM      Result Value Ref Range   SUDS Rapid HIV Screen NON REACTIVE  NON REACTIVE     Imaging Studies:    none   Medications:  Scheduled . betamethasone acetate-betamethasone sodium phosphate  12 mg Intramuscular Q24 Hr x 2  . fentaNYL  100 mcg Intravenous Once   I have reviewed the patient's current medications.  ASSESSMENT: Patient Active Problem List   Diagnosis Date Noted  . Premature labor after 22 weeks and before 37 weeks without delivery 04/11/2014  . Wheezing 09/14/2013  . History of prior pregnancy with short cervix, currently pregnant 06/02/2013  . Hx of maternal chlamydia infection, currently pregnant 04/10/2013    PLAN: Currently, patient wishes everything done for the baby, including resuscitation and emergent c-section if NRFHT.  Discussed that this would be a vertical uterine incision and significance regarding this.   Delivery for maternal or fetal concerns Continuous monitoring.  Second BMZ at 1245 today.  Continue Magnesium Continue routine antenatal care. Treatment for GBS with evidence of labor.   STINSON, JACOB JEHIEL 04/13/2014,7:11 AM

## 2014-04-13 NOTE — Progress Notes (Signed)
UR completed 

## 2014-04-13 NOTE — Brief Op Note (Signed)
04/11/2014 - 04/13/2014  1:48 PM  PATIENT:  Michele Hayden  23 y.o. female  PRE-OPERATIVE DIAGNOSIS:  possible abruption  POST-OPERATIVE DIAGNOSIS:  posterior placental abruption, chorioamnioitis  PROCEDURE:  Procedure(s): CESAREAN SECTION (N/A), lower uterine segment vertical Probable extension of the vertical incision into or through the cervix. The cervix could not be palpated from the abdomen. Patient should be monitored for cervical healing, with consideration given for prophylactic cerclage for any future pregnancies  SURGEON:  Surgeon(s) and Role:    * Tilda BurrowJohn Adebayo Ensminger V, MD - Primary  PHYSICIAN ASSISTANT:   ASSISTANTS: Blackwell CST   ANESTHESIA:   epidural  EBL:  Total I/O In: 3847.1 [P.O.:300; I.V.:3547.1] Out: 2415 [Urine:1215; Blood:1200]  BLOOD ADMINISTERED:none  DRAINS: Urinary Catheter (Foley)   LOCAL MEDICATIONS USED:  NONE  SPECIMEN:  Source of Specimen:  Placenta to pathology  DISPOSITION OF SPECIMEN:  PATHOLOGY  COUNTS:  YES  TOURNIQUET:  * No tourniquets in log *  DICTATION: .Dragon Dictation  PLAN OF CARE: As admission orders  PATIENT DISPOSITION:  PACU - hemodynamically stable.   Delay start of Pharmacological VTE agent (>24hrs) due to surgical blood loss or risk of bleeding: not applicable

## 2014-04-13 NOTE — Progress Notes (Signed)
ANTIBIOTIC CONSULT NOTE - INITIAL  Pharmacy Consult for Gentamicin Indication: Chorioamnionitis  No Known Allergies  Patient Measurements: Height: 5\' 7"  (170.2 cm) Weight: 230 lb (104.327 kg) IBW/kg (Calculated) : 61.6 Adjusted Body Weight: 74.3 kg  Vital Signs: Temp: 97.5 F (36.4 C) (10/17 1508) Temp Source: Axillary (10/17 1101) BP: 109/56 mmHg (10/17 1508) Pulse Rate: 75 (10/17 1508)  Labs:  Recent Labs  04/11/14 2135 04/13/14 0647 04/13/14 1348  WBC 10.5 16.6* 15.3*  HGB 12.2 10.9* 8.8*  PLT 101* 95* 85*     Microbiology: Recent Results (from the past 720 hour(s))  OB RESULTS CONSOLE GBS     Status: None   Collection Time    04/12/14 12:00 AM      Result Value Ref Range Status   GBS Positive   Final  GROUP B STREP BY PCR     Status: Abnormal   Collection Time    04/12/14  4:09 PM      Result Value Ref Range Status   Group B strep by PCR POSITIVE (*) NEGATIVE Final   Comment: RESULT CALLED TO, READ BACK BY AND VERIFIED WITH:     SOLEY,E AT 1700 ON 04/12/14 BY HAGGINSC    Medications:  Clindamycin 900 mg IV q8h  Assessment: 23 y.o. female [id number] at 423w5d being initiated on clindamycin and gentamicin S/P C-section for chorio due to significant malodor and premature delivery.   Estimated Ke = 0.358, Vd = 26 L  Goal of Therapy:  Gentamicin peak 6-8 mg/L and Trough < 1 mg/L  Plan:  Gentamicin 170 mg IV every 8 hrs  Check Scr with next labs if gentamicin continued. Will check gentamicin levels if continued > 72hr or clinically indicated.  Kmari Halter [place] 04/13/2014,3:40 PM

## 2014-04-13 NOTE — Plan of Care (Signed)
Problem: Phase I Progression Outcomes Goal: Pain controlled with appropriate interventions Outcome: Progressing Pt post epidural status, comfortable at this time.

## 2014-04-13 NOTE — Plan of Care (Signed)
Problem: Phase I Progression Outcomes Goal: Tolerating diet Outcome: Completed/Met Date Met:  04/13/14 Kept pt on clear liquids because of CS risk.

## 2014-04-14 LAB — CBC
HEMATOCRIT: 20.5 % — AB (ref 36.0–46.0)
Hemoglobin: 7 g/dL — ABNORMAL LOW (ref 12.0–15.0)
MCH: 30.8 pg (ref 26.0–34.0)
MCHC: 34.1 g/dL (ref 30.0–36.0)
MCV: 90.3 fL (ref 78.0–100.0)
PLATELETS: 74 10*3/uL — AB (ref 150–400)
RBC: 2.27 MIL/uL — ABNORMAL LOW (ref 3.87–5.11)
RDW: 14.3 % (ref 11.5–15.5)
WBC: 8.2 10*3/uL (ref 4.0–10.5)

## 2014-04-14 NOTE — Progress Notes (Signed)
Clinical Social Work Department PSYCHOSOCIAL ASSESSMENT - MATERNAL/CHILD 04/14/2014  Patient:  Michele Hayden, Michele Hayden  Account Number:  [id number]  Admit Date:  04/11/2014  Michele Hayden Name:   Michele Hayden    Clinical Social Worker:  Michele Maclaren, LCSW   Date/Time:  04/14/2014 10:30 AM  Date Referred:  04/14/2014   Referral source  NICU     Referred reason  NICU   Other referral source:    I:  FAMILY / [place] legal guardian:  PARENT  Guardian - Name Guardian - Age Guardian - Address  Michele Hayden, Michele Hayden 22 [address]  [address]  [place], [place] [address]  Michele Hayden 24 same as above   Other household support members/support persons Other support:   Maternal and paternal relatives    II  PSYCHOSOCIAL DATA Information Source:    Occupational hygienist Employment:   FOB is employed   Museum/gallery curator resources:  [employer] [place]:   Other  [place] / Grade:   Maternity Care Coordinator / Child Services Coordination / Early Interventions:  Cultural issues impacting care:    III  STRENGTHS Strengths  Understanding of illness  Supportive family/friends  Compliance with medical plan  Adequate Resources   Strength comment:    IV  RISK FACTORS AND CURRENT PROBLEMS Current Problem:       V  SOCIAL WORK ASSESSMENT Met with mother who was pleasant and receptive to social work intervention.  Parents are not married, but have been in Hayden relationship for the past 3 years and have Hayden 78 month old son.  They cohabitate.   Mother states that she is very worried about newborn and clinging to the hope that she will survive.  Informed that initially wanted comfort measures only, but after speaking with her boyfriend who is in support of aggressive care, she decided to request that everything be done to save her child.  Mother states that she is happy that she made this decision, because she would have Hayden difficult time living with the  fact that she did not do all that she could to save her child.  Mother relates that the premature birth of their daughter has strained her relationship with FOB.  Discussed the importance of understanding the reason for the tension in the relationship, and possible strategies to maintain Hayden healthy relationship with FOB. Validated her feelings and provided supportive feedback.    Mother states that she is aware that her child may not survive, but maintain hope that she will.  Informed that most of the family were not told about the pregnancy until about Hayden week ago.   Family and friends have reportedly been very supportive.   Mother states that the staff caring for her and newborn have been very supportive and she appreciate all efforts to save her child.      VI SOCIAL WORK PLAN Social Work Plan  Psychosocial Support/Ongoing Assessment of Needs  Information/Referral to [employer]   Type of pt/family education:   Information on SSI   If child protective services report - county:   If child protective services report - date:   Information/referral to community resources comment:   Other social work plan:   Will continue to follow for support

## 2014-04-14 NOTE — Progress Notes (Signed)
Subjective: Postpartum Day 1: Cesarean Delivery Patient reports tolerating PO and no problems voiding.  Pt reports that she is pumping for her infant. She reports that her bleeding has decreased since delivery.  She reports that she initially had some dizziness which has improved.  She reports that she .     Objective: Vital signs in last 24 hours: Temp:  [97.4 F (36.3 C)-99.7 F (37.6 C)] 98.8 F (37.1 C) (10/18 0555) Pulse Rate:  [61-91] 80 (10/18 0555) Resp:  [18-23] 18 (10/18 0555) BP: (97-132)/(44-80) 112/50 mmHg (10/18 0555) SpO2:  [93 %-100 %] 100 % (10/18 0555)  Physical Exam:  General: alert and no distress Lochia: appropriate Uterine Fundus: firm Incision: dressing: clean, dry and intact  DVT Evaluation: No evidence of DVT seen on physical exam.   Recent Labs  04/13/14 1825 04/14/14 0534  HGB 8.6* 7.0*  HCT 25.1* 20.5*   CBC    Component Value Date/Time   WBC 8.2 04/14/2014 0534   RBC 2.27* 04/14/2014 0534   HGB 7.0* 04/14/2014 0534   HCT 20.5* 04/14/2014 0534   PLT 74* 04/14/2014 0534   MCV 90.3 04/14/2014 0534   MCH 30.8 04/14/2014 0534   MCHC 34.1 04/14/2014 0534   RDW 14.3 04/14/2014 0534   LYMPHSABS 0.9 04/13/2014 1825   MONOABS 0.8 04/13/2014 1825   EOSABS 0.0 04/13/2014 1825   BASOSABS 0.0 04/13/2014 1825     Assessment/Plan: Status post Cesarean section. Doing well postoperatively.  Asymptomatic anemia Low platelets- will follow   HARRAWAY-SMITH, Irineo Gaulin 04/14/2014, 11:30 AM

## 2014-04-14 NOTE — Anesthesia Postprocedure Evaluation (Signed)
  Anesthesia Post-op Note  Patient: Michele Hayden  Procedure(s) Performed: Procedure(s): CESAREAN SECTION (N/A)  Patient Location: [facility]  Anesthesia Type:Epidural  Level of Consciousness: awake and alert   Airway and Oxygen Therapy: Patient Spontanous Breathing  Post-op Pain: mild  Post-op Assessment: Post-op Vital signs reviewed, Patient's Cardiovascular Status Stable, Respiratory Function Stable, No signs of Nausea or vomiting, Pain level controlled, No headache, No residual numbness and No residual motor weakness  Post-op Vital Signs: Reviewed  Last Vitals:  Filed Vitals:   04/14/14 0555  BP: 112/50  Pulse: 80  Temp: 37.1 C  Resp: 18    Complications: No apparent anesthesia complications

## 2014-04-14 NOTE — Addendum Note (Signed)
Addendum created 04/14/14 [id number] by Angela Adamana G Nelda Luckey, CRNA   Modules edited: Charges VN, Notes Section   Notes Section:  File: [id number]

## 2014-04-15 ENCOUNTER — Encounter (HOSPITAL_COMMUNITY): Payer: Self-pay | Admitting: Obstetrics and Gynecology

## 2014-04-15 ENCOUNTER — Encounter (HOSPITAL_COMMUNITY): Payer: Self-pay | Admitting: Certified Registered Nurse Anesthetist

## 2014-04-15 LAB — TYPE AND SCREEN
ABO/RH(D): O POS
Antibody Screen: NEGATIVE
UNIT DIVISION: 0
UNIT DIVISION: 0

## 2014-04-15 LAB — CBC
HCT: 21 % — ABNORMAL LOW (ref 36.0–46.0)
HCT: 20.5 % — ABNORMAL LOW (ref 36.0–46.0)
Hemoglobin: 7.2 g/dL — ABNORMAL LOW (ref 12.0–15.0)
Hemoglobin: 6.9 g/dL — CL (ref 12.0–15.0)
MCH: 30.9 pg (ref 26.0–34.0)
MCH: 30.4 pg (ref 26.0–34.0)
MCHC: 34.3 g/dL (ref 30.0–36.0)
MCHC: 33.7 g/dL (ref 30.0–36.0)
MCV: 90.1 fL (ref 78.0–100.0)
MCV: 90.3 fL (ref 78.0–100.0)
PLATELETS: 84 10*3/uL — AB (ref 150–400)
PLATELETS: 86 10*3/uL — AB (ref 150–400)
RBC: 2.33 MIL/uL — AB (ref 3.87–5.11)
RBC: 2.27 MIL/uL — AB (ref 3.87–5.11)
RDW: 14.3 % (ref 11.5–15.5)
RDW: 14.2 % (ref 11.5–15.5)
WBC: 7.2 10*3/uL (ref 4.0–10.5)
WBC: 7.8 10*3/uL (ref 4.0–10.5)

## 2014-04-15 LAB — GC/CHLAMYDIA PROBE AMP
CT PROBE, AMP APTIMA: NEGATIVE
GC Probe RNA: NEGATIVE

## 2014-04-15 MED ORDER — SODIUM CHLORIDE 0.9 % IV SOLN
510.0000 mg | Freq: Once | INTRAVENOUS | Status: AC
  Administered 2014-04-15: 510 mg via INTRAVENOUS
  Filled 2014-04-15: qty 17

## 2014-04-15 NOTE — Progress Notes (Signed)
Michele Hayden reported that she is doing okay for the moment, but that things have been really up and down for her daughter, Michele Hayden and it has been an Personal assistantemotional roller-coaster.  She reports that she has had good family support and that she and her boyfriend are coping better together than they were the first day. They are both hopeful that things will go well for their daughter.    She was very appreciative of all the support she's received from staff.  She asked us to keep checking in on her, which we are glad to do.  Please also page as needs arise.  [address]Chaplain Katy [place] Pager, [phone] 9:47 AM   04/15/14 0900  Clinical Encounter Type  Visited With Patient  Visit Type Follow-up  Spiritual Encounters  Spiritual Needs Emotional

## 2014-04-15 NOTE — Progress Notes (Signed)
CRITICAL VALUE ALERT  Critical value received:  Hgb 6.9    Hct 20.5     Plt 86  Date of notification:  04/15/2014  Time of notification:  1700  Critical value read back:yes  Nurse who received alert:  Delorse Lekhris Galloway, RN  MD notified (1st page):  Adrian BlackwaterStinson  Time of first page:  1705  MD notified (2nd page): n/a  Time of second page:n/a  Responding MD:  Adrian BlackwaterStinson   Time MD responded:  1705

## 2014-04-15 NOTE — Lactation Note (Signed)
This note was copied from the chart of Michele [name] Mcelhiney. Lactation Consultation Note  Initial visit done.  Baby remains critical in the NICU but mom desires to provide breastmilk for her daughter.  She has DEBP at bedside and reviewed use and pumping schedule.  Mom states she is obtaining a few drops.  Reviewed supply and demand.  [facility],  [facility]   Encouraged mom to call with any concerns/assist.  Patient Name: Michele Hayden Today's Date: 04/15/2014 Reason for consult: Initial assessment;NICU baby   Maternal Data    Feeding    LATCH Score/Interventions                      Lactation Tools Discussed/Used WIC Program: Yes Pump Review: Setup, frequency, and cleaning;Milk Storage Initiated by:: RN Date initiated:: 04/13/14   Consult Status Consult Status: Follow-up Date: 04/16/14 Follow-up type: In-patient    Huston FoleyMOULDEN, Leandro Berkowitz S 04/15/2014, 11:04 AM

## 2014-04-15 NOTE — Progress Notes (Signed)
Subjective: Postpartum Day 2: Cesarean Delivery Patient reports tolerating PO, + flatus and no problems voiding.  She reports that her pain is well controlled.  She reports less bleeding. She reports no BM. She reports less dizziness.  Pt desires mirena for PP contraception.  Objective: Vital signs in last 24 hours: Temp:  [99 F (37.2 C)-99.9 F (37.7 C)] 99.9 F (37.7 C) (10/19 0602) Pulse Rate:  [83-93] 83 (10/19 0602) Resp:  [18] 18 (10/19 0602) BP: (104-112)/(48-51) 110/49 mmHg (10/19 0602) SpO2:  [98 %-100 %] 98 % (10/19 0602)  Physical Exam:  General: alert and no distress Lochia: appropriate Uterine Fundus: firm Incision: dressing clean/dry/intact DVT Evaluation: No evidence of DVT seen on physical exam.   Recent Labs  04/14/14 0534 04/15/14 0515  HGB 7.0* 7.2*  HCT 20.5* 21.0*    Assessment/Plan: Status post Cesarean section. Doing well postoperatively.  Continue current care.  HARRAWAY-SMITH, Jude Naclerio 04/15/2014, 9:30 AM

## 2014-04-16 ENCOUNTER — Encounter: Payer: Medicaid Other | Admitting: Family Medicine

## 2014-04-16 LAB — CBC WITH DIFFERENTIAL/PLATELET
Basophils Absolute: 0 10*3/uL (ref 0.0–0.1)
Basophils Relative: 0 % (ref 0–1)
Eosinophils Absolute: 0.3 10*3/uL (ref 0.0–0.7)
Eosinophils Relative: 4 % (ref 0–5)
HEMATOCRIT: 22.5 % — AB (ref 36.0–46.0)
HEMOGLOBIN: 7.5 g/dL — AB (ref 12.0–15.0)
LYMPHS ABS: 1.7 10*3/uL (ref 0.7–4.0)
Lymphocytes Relative: 25 % (ref 12–46)
MCH: 30.2 pg (ref 26.0–34.0)
MCHC: 33.3 g/dL (ref 30.0–36.0)
MCV: 90.7 fL (ref 78.0–100.0)
Monocytes Absolute: 0.6 10*3/uL (ref 0.1–1.0)
Monocytes Relative: 9 % (ref 3–12)
NEUTROS ABS: 4.2 10*3/uL (ref 1.7–7.7)
NEUTROS PCT: 62 % (ref 43–77)
Platelets: 98 10*3/uL — ABNORMAL LOW (ref 150–400)
RBC: 2.48 MIL/uL — ABNORMAL LOW (ref 3.87–5.11)
RDW: 14.1 % (ref 11.5–15.5)
WBC: 6.8 10*3/uL (ref 4.0–10.5)

## 2014-04-16 MED ORDER — OXYCODONE-ACETAMINOPHEN 5-325 MG PO TABS: 1.0000 | ORAL_TABLET | ORAL | Status: DC | PRN

## 2014-04-16 NOTE — Progress Notes (Signed)
04/16/14 1100  Clinical Encounter Type  Visited With Patient and family together (partner, infant son)  Visit Type Follow-up;Spiritual support;Social support  Spiritual Encounters  Spiritual Needs Emotional   Followed up with [name] and SO to offer further spiritual and emotional support.  He particularly was interested in sharing and processing some of his experience.  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  This connection brings him meaning and adds value to his professional contributions to the world.  Helped him find birth Passenger transport managerregistrar and served as Landscape architectpastoral presence, empathic listener, and witness to his story. [name] shared and processed her mixed feelings about having to leave baby at hospital while also feeling relief at being discharged herself.  Family is aware and appreciative of ongoing chaplain availability, but please also page as needs arise:  [phone].  Thank you.  [address]Chaplain Eleri Ruben [place], [place] [phone]

## 2014-04-16 NOTE — Progress Notes (Signed)
Patient discharged with percocet prescription signed by MD. Previous shift RN discussed discharge instructions with patient. Patient did not have any questions about discharge information at this time. Verbalized understanding. Offered to push patient out in wheelchair with assistance and patient decided to independently ambulate from room to vehicle and discharge home.

## 2014-04-16 NOTE — Discharge Summary (Signed)
Obstetric Discharge Summary Reason for Admission: vaginal bleeding, abruption Prenatal Procedures: none Intrapartum Procedures: cesarean: vertical and betamethasone, magnesium for cerebral palsy prophylaxis,  Postpartum Procedures: none and antibiotics Complications-Operative and Postpartum: cervical extension during cesarean section Hemoglobin  Date Value Ref Range Status  04/16/2014 7.5* 12.0 - 15.0 g/dL Final     HCT  Date Value Ref Range Status  04/16/2014 22.5* 36.0 - 46.0 % Final   HPI: admitted with vaginal bleeding and positive amnisure (suspected false positive secondary bleeding) and patient initially declined betamethasone/interventions for baby, however the following day patient requested interventions and received abx/mag for CP ppx and betamethasone.  She subsequently developed chorioamnionitis and was delivered by emergent cesarean section secondary to abruption.  Today: doing well, no complaints, moderate lochia, pain well controlled.  Physical Exam:  General: alert and cooperative Lochia: appropriate Uterine Fundus: firm Incision: wound dressed DVT Evaluation: No evidence of DVT seen on physical exam.  Discharge Diagnoses: abruption, chorioamnionitis, emergent classical cesarean section, cervical extension during cesarean section  Discharge Information: Date: 04/16/2014 Activity: pelvic rest Diet: routine Medications: percocet 5/325mg  #30 Condition: stable Instructions: refer to practice specific booklet Discharge to: home   Newborn Data: Live born female  Birth Weight: 1 lb 2.7 oz (530 g) APGAR: 2, 2  Home with NICU.  Michele Hayden Michele Hayden 04/16/2014, 9:28 AM

## 2014-04-16 NOTE — Addendum Note (Signed)
Addendum created 04/16/14 1147 by Renford DillsJanet L Camaryn Lumbert, CRNA   Modules edited: Anesthesia Events, Anesthesia LDA

## 2014-04-16 NOTE — Addendum Note (Signed)
Addendum created 04/16/14 1150 by Renford DillsJanet L Hadas Jessop, CRNA   Modules edited: Charges VN

## 2014-04-16 NOTE — Discharge Instructions (Signed)

## 2014-04-16 NOTE — Lactation Note (Signed)
This note was copied from the chart of Michele Hayden. Lactation Consultation Note  Follow up visit made.  Mom is pumping every 3 hours and obtaining 30-40 mls.  Breasts are filling but not engorged.  [facility]  Manual pump given with instructions to use until then.  Encouraged mom to call for concerns/assist prn.  Patient Name: Michele Hayden Today's Date: 04/16/2014     Maternal Data    Feeding    LATCH Score/Interventions                      Lactation Tools Discussed/Used     Consult Status      Huston FoleyMOULDEN, Tulio Facundo S 04/16/2014, 5:15 PM

## 2014-04-17 NOTE — Discharge Summary (Signed)
Attestation of Attending Supervision of Obstetric Fellow: Evaluation and management procedures were performed by the Obstetric Fellow under my supervision and collaboration.  I have reviewed the Obstetric Fellow's note and chart, and I agree with the management and plan.  Jquan Egelston, MD, FACOG Attending Obstetrician & Gynecologist Faculty Practice, [facility]   

## 2014-04-18 ENCOUNTER — Other Ambulatory Visit: Payer: Self-pay | Admitting: Obstetrics & Gynecology

## 2014-04-18 DIAGNOSIS — Z98891 History of uterine scar from previous surgery: Secondary | ICD-10-CM | POA: Insufficient documentation

## 2014-04-18 DIAGNOSIS — O411290 Chorioamnionitis, unspecified trimester, not applicable or unspecified: Secondary | ICD-10-CM

## 2014-04-18 DIAGNOSIS — G8918 Other acute postprocedural pain: Secondary | ICD-10-CM

## 2014-04-18 MED ORDER — IBUPROFEN 600 MG PO TABS: 600.0000 mg | ORAL_TABLET | Freq: Four times a day (QID) | ORAL | Status: DC | PRN

## 2014-04-18 MED ORDER — OXYCODONE-ACETAMINOPHEN 5-325 MG PO TABS: 1.0000 | ORAL_TABLET | Freq: Four times a day (QID) | ORAL | Status: DC | PRN

## 2014-04-18 NOTE — Progress Notes (Signed)
Patient was discharged on 04/16/14 with Percocet script signed by resident.  [facility]; Pharmacy unable to verify resident's DEA #.  Printed out new script for patient; she will pick it up ain [facility] desk. Also wrote her for Ibuprofen.  Patient encouraged to call clinic/come to MAU for any further concerns.

## 2014-04-18 NOTE — Addendum Note (Signed)
Addended by: Jaynie CollinsANYANWU, Juanitta Earnhardt A on: 04/18/2014 07:10 PM   Modules accepted: Orders

## 2014-04-29 ENCOUNTER — Encounter (HOSPITAL_COMMUNITY): Payer: Self-pay | Admitting: Obstetrics and Gynecology

## 2014-06-24 ENCOUNTER — Encounter: Payer: Self-pay | Admitting: *Deleted

## 2014-06-25 ENCOUNTER — Encounter: Payer: Self-pay | Admitting: Obstetrics & Gynecology

## 2014-07-11 ENCOUNTER — Encounter (HOSPITAL_COMMUNITY): Payer: Self-pay | Admitting: Neurology

## 2014-07-11 ENCOUNTER — Emergency Department (HOSPITAL_COMMUNITY)
Admission: EM | Admit: 2014-07-11 | Discharge: 2014-07-11 | Disposition: A | Discharge status: Home / Self Care | Payer: Medicaid Other | Attending: Emergency Medicine | Admitting: Emergency Medicine

## 2014-07-11 DIAGNOSIS — S161XXA Strain of muscle, fascia and tendon at neck level, initial encounter: Secondary | ICD-10-CM | POA: Diagnosis not present

## 2014-07-11 DIAGNOSIS — Y998 Other external cause status: Secondary | ICD-10-CM | POA: Diagnosis not present

## 2014-07-11 DIAGNOSIS — Y92410 Unspecified street and highway as the place of occurrence of the external cause: Secondary | ICD-10-CM | POA: Insufficient documentation

## 2014-07-11 DIAGNOSIS — Z720 Tobacco use: Secondary | ICD-10-CM | POA: Diagnosis not present

## 2014-07-11 DIAGNOSIS — Y9389 Activity, other specified: Secondary | ICD-10-CM | POA: Insufficient documentation

## 2014-07-11 DIAGNOSIS — S299XXA Unspecified injury of thorax, initial encounter: Secondary | ICD-10-CM | POA: Diagnosis not present

## 2014-07-11 DIAGNOSIS — S0990XA Unspecified injury of head, initial encounter: Secondary | ICD-10-CM | POA: Diagnosis not present

## 2014-07-11 DIAGNOSIS — M62838 Other muscle spasm: Secondary | ICD-10-CM | POA: Insufficient documentation

## 2014-07-11 DIAGNOSIS — Z79899 Other long term (current) drug therapy: Secondary | ICD-10-CM | POA: Insufficient documentation

## 2014-07-11 DIAGNOSIS — S199XXA Unspecified injury of neck, initial encounter: Secondary | ICD-10-CM | POA: Diagnosis present

## 2014-07-11 MED ORDER — NAPROXEN 500 MG PO TABS: 500.0000 mg | ORAL_TABLET | Freq: Two times a day (BID) | ORAL | Status: DC

## 2014-07-11 MED ORDER — CYCLOBENZAPRINE HCL 10 MG PO TABS: 10.0000 mg | ORAL_TABLET | Freq: Two times a day (BID) | ORAL | Status: DC | PRN

## 2014-07-11 NOTE — Discharge Instructions (Signed)
Cervical Strain and Sprain (Whiplash) °with Rehab °Cervical strain and sprain are injuries that commonly occur with "whiplash" injuries. Whiplash occurs when the neck is forcefully whipped backward or forward, such as during a motor vehicle accident or during contact sports. The muscles, ligaments, tendons, discs, and nerves of the neck are susceptible to injury when this occurs. °RISK FACTORS °Risk of having a whiplash injury increases if: °· Osteoarthritis of the spine. °· Situations that make head or neck accidents or trauma more likely. °· High-risk sports (football, rugby, wrestling, hockey, auto racing, gymnastics, diving, contact karate, or boxing). °· Poor strength and flexibility of the neck. °· Previous neck injury. °· Poor tackling technique. °· Improperly fitted or padded equipment. °SYMPTOMS  °· Pain or stiffness in the front or back of neck or both. °· Symptoms may present immediately or up to 24 hours after injury. °· Dizziness, headache, nausea, and vomiting. °· Muscle spasm with soreness and stiffness in the neck. °· Tenderness and swelling at the injury site. °PREVENTION °· Learn and use proper technique (avoid tackling with the head, spearing, and head-butting; use proper falling techniques to avoid landing on the head). °· Warm up and stretch properly before activity. °· Maintain physical fitness: °· Strength, flexibility, and endurance. °· Cardiovascular fitness. °· Wear properly fitted and padded protective equipment, such as padded soft collars, for participation in contact sports. °PROGNOSIS  °Recovery from cervical strain and sprain injuries is dependent on the extent of the injury. These injuries are usually curable in 1 week to 3 months with appropriate treatment.  °RELATED COMPLICATIONS  °· Temporary numbness and weakness may occur if the nerve roots are damaged, and this may persist until the nerve has completely healed. °· Chronic pain due to frequent recurrence of  symptoms. °· Prolonged healing, especially if activity is resumed too soon (before complete recovery). °TREATMENT  °Treatment initially involves the use of ice and medication to help reduce pain and inflammation. It is also important to perform strengthening and stretching exercises and modify activities that worsen symptoms so the injury does not get worse. These exercises may be performed at home or with a therapist. For patients who experience severe symptoms, a soft, padded collar may be recommended to be worn around the neck.  °Improving your posture may help reduce symptoms. Posture improvement includes pulling your chin and abdomen in while sitting or standing. If you are sitting, sit in a firm chair with your buttocks against the back of the chair. While sleeping, try replacing your pillow with a small towel rolled to 2 inches in diameter, or use a cervical pillow or soft cervical collar. Poor sleeping positions delay healing.  °For patients with nerve root damage, which causes numbness or weakness, the use of a cervical traction apparatus may be recommended. Surgery is rarely necessary for these injuries. However, cervical strain and sprains that are present at birth (congenital) may require surgery. °MEDICATION  °· If pain medication is necessary, nonsteroidal anti-inflammatory medications, such as aspirin and ibuprofen, or other minor pain relievers, such as acetaminophen, are often recommended. °· Do not take pain medication for 7 days before surgery. °· Prescription pain relievers may be given if deemed necessary by your caregiver. Use only as directed and only as much as you need. °HEAT AND COLD:  °· Cold treatment (icing) relieves pain and reduces inflammation. Cold treatment should be applied for 10 to 15 minutes every 2 to 3 hours for inflammation and pain and immediately after any activity that aggravates   your symptoms. Use ice packs or an ice massage. °· Heat treatment may be used prior to  performing the stretching and strengthening activities prescribed by your caregiver, physical therapist, or athletic trainer. Use a heat pack or a warm soak. °SEEK MEDICAL CARE IF:  °· Symptoms get worse or do not improve in 2 weeks despite treatment. °· New, unexplained symptoms develop (drugs used in treatment may produce side effects). °EXERCISES °RANGE OF MOTION (ROM) AND STRETCHING EXERCISES - Cervical Strain and Sprain °These exercises may help you when beginning to rehabilitate your injury. In order to successfully resolve your symptoms, you must improve your posture. These exercises are designed to help reduce the forward-head and rounded-shoulder posture which contributes to this condition. Your symptoms may resolve with or without further involvement from your physician, physical therapist or athletic trainer. While completing these exercises, remember:  °· Restoring tissue flexibility helps normal motion to return to the joints. This allows healthier, less painful movement and activity. °· An effective stretch should be held for at least 20 seconds, although you may need to begin with shorter hold times for comfort. °· A stretch should never be painful. You should only feel a gentle lengthening or release in the stretched tissue. °STRETCH- Axial Extensors °· Lie on your back on the floor. You may bend your knees for comfort. Place a rolled-up hand towel or dish towel, about 2 inches in diameter, under the part of your head that makes contact with the floor. °· Gently tuck your chin, as if trying to make a "double chin," until you feel a gentle stretch at the base of your head. °· Hold __________ seconds. °Repeat __________ times. Complete this exercise __________ times per day.  °STRETCH - Axial Extension  °· Stand or sit on a firm surface. Assume a good posture: chest up, shoulders drawn back, abdominal muscles slightly tense, knees unlocked (if standing) and feet hip width apart. °· Slowly retract your  chin so your head slides back and your chin slightly lowers. Continue to look straight ahead. °· You should feel a gentle stretch in the back of your head. Be certain not to feel an aggressive stretch since this can cause headaches later. °· Hold for __________ seconds. °Repeat __________ times. Complete this exercise __________ times per day. °STRETCH - Cervical Side Bend  °· Stand or sit on a firm surface. Assume a good posture: chest up, shoulders drawn back, abdominal muscles slightly tense, knees unlocked (if standing) and feet hip width apart. °· Without letting your nose or shoulders move, slowly tip your right / left ear to your shoulder until your feel a gentle stretch in the muscles on the opposite side of your neck. °· Hold __________ seconds. °Repeat __________ times. Complete this exercise __________ times per day. °STRETCH - Cervical Rotators  °· Stand or sit on a firm surface. Assume a good posture: chest up, shoulders drawn back, abdominal muscles slightly tense, knees unlocked (if standing) and feet hip width apart. °· Keeping your eyes level with the ground, slowly turn your head until you feel a gentle stretch along the back and opposite side of your neck. °· Hold __________ seconds. °Repeat __________ times. Complete this exercise __________ times per day. °RANGE OF MOTION - Neck Circles  °· Stand or sit on a firm surface. Assume a good posture: chest up, shoulders drawn back, abdominal muscles slightly tense, knees unlocked (if standing) and feet hip width apart. °· Gently roll your head down and around from the   back of one shoulder to the back of the other. The motion should never be forced or painful.  Repeat the motion 10-20 times, or until you feel the neck muscles relax and loosen. Repeat __________ times. Complete the exercise __________ times per day. STRENGTHENING EXERCISES - Cervical Strain and Sprain These exercises may help you when beginning to rehabilitate your injury. They may  resolve your symptoms with or without further involvement from your physician, physical therapist, or athletic trainer. While completing these exercises, remember:   Muscles can gain both the endurance and the strength needed for everyday activities through controlled exercises.  Complete these exercises as instructed by your physician, physical therapist, or athletic trainer. Progress the resistance and repetitions only as guided.  You may experience muscle soreness or fatigue, but the pain or discomfort you are trying to eliminate should never worsen during these exercises. If this pain does worsen, stop and make certain you are following the directions exactly. If the pain is still present after adjustments, discontinue the exercise until you can discuss the trouble with your clinician. STRENGTH - Cervical Flexors, Isometric  Face a wall, standing about 6 inches away. Place a small pillow, a ball about 6-8 inches in diameter, or a folded towel between your forehead and the wall.  Slightly tuck your chin and gently push your forehead into the soft object. Push only with mild to moderate intensity, building up tension gradually. Keep your jaw and forehead relaxed.  Hold 10 to 20 seconds. Keep your breathing relaxed.  Release the tension slowly. Relax your neck muscles completely before you start the next repetition. Repeat __________ times. Complete this exercise __________ times per day. STRENGTH- Cervical Lateral Flexors, Isometric   Stand about 6 inches away from a wall. Place a small pillow, a ball about 6-8 inches in diameter, or a folded towel between the side of your head and the wall.  Slightly tuck your chin and gently tilt your head into the soft object. Push only with mild to moderate intensity, building up tension gradually. Keep your jaw and forehead relaxed.  Hold 10 to 20 seconds. Keep your breathing relaxed.  Release the tension slowly. Relax your neck muscles completely  before you start the next repetition. Repeat __________ times. Complete this exercise __________ times per day. STRENGTH - Cervical Extensors, Isometric   Stand about 6 inches away from a wall. Place a small pillow, a ball about 6-8 inches in diameter, or a folded towel between the back of your head and the wall.  Slightly tuck your chin and gently tilt your head back into the soft object. Push only with mild to moderate intensity, building up tension gradually. Keep your jaw and forehead relaxed.  Hold 10 to 20 seconds. Keep your breathing relaxed.  Release the tension slowly. Relax your neck muscles completely before you start the next repetition. Repeat __________ times. Complete this exercise __________ times per day. POSTURE AND BODY MECHANICS CONSIDERATIONS - Cervical Strain and Sprain Keeping correct posture when sitting, standing or completing your activities will reduce the stress put on different body tissues, allowing injured tissues a chance to heal and limiting painful experiences. The following are general guidelines for improved posture. Your physician or physical therapist will provide you with any instructions specific to your needs. While reading these guidelines, remember:  The exercises prescribed by your provider will help you have the flexibility and strength to maintain correct postures.  The correct posture provides the optimal environment for your joints to  work. All of your joints have less wear and tear when properly supported by a spine with good posture. This means you will experience a healthier, less painful body.  Correct posture must be practiced with all of your activities, especially prolonged sitting and standing. Correct posture is as important when doing repetitive low-stress activities (typing) as it is when doing a single heavy-load activity (lifting). PROLONGED STANDING WHILE SLIGHTLY LEANING FORWARD When completing a task that requires you to lean  forward while standing in one place for a long time, place either foot up on a stationary 2- to 4-inch high object to help maintain the best posture. When both feet are on the ground, the low back tends to lose its slight inward curve. If this curve flattens (or becomes too large), then the back and your other joints will experience too much stress, fatigue more quickly, and can cause pain.  RESTING POSITIONS Consider which positions are most painful for you when choosing a resting position. If you have pain with flexion-based activities (sitting, bending, stooping, squatting), choose a position that allows you to rest in a less flexed posture. You would want to avoid curling into a fetal position on your side. If your pain worsens with extension-based activities (prolonged standing, working overhead), avoid resting in an extended position such as sleeping on your stomach. Most people will find more comfort when they rest with their spine in a more neutral position, neither too rounded nor too arched. Lying on a non-sagging bed on your side with a pillow between your knees, or on your back with a pillow under your knees will often provide some relief. Keep in mind, being in any one position for a prolonged period of time, no matter how correct your posture, can still lead to stiffness. WALKING Walk with an upright posture. Your ears, shoulders, and hips should all line up. OFFICE WORK When working at a desk, create an environment that supports good, upright posture. Without extra support, muscles fatigue and lead to excessive strain on joints and other tissues. CHAIR:  A chair should be able to slide under your desk when your back makes contact with the back of the chair. This allows you to work closely.  The chair's height should allow your eyes to be level with the upper part of your monitor and your hands to be slightly lower than your elbows.  Body position:  Your feet should make contact with the  floor. If this is not possible, use a foot rest.  Keep your ears over your shoulders. This will reduce stress on your neck and low back. Document Released: 06/14/2005 Document Revised: 10/29/2013 Document Reviewed: 09/26/2008 [facility] Patient Information 2015 [place], [place]. This information is not intended to replace advice given to you by your health care provider. Make sure you discuss any questions you have with your health care provider.  Muscle Cramps and Spasms Muscle cramps and spasms occur when a muscle or muscles tighten and you have no control over this tightening (involuntary muscle contraction). They are a common problem and can develop in any muscle. The most common place is in the calf muscles of the leg. Both muscle cramps and muscle spasms are involuntary muscle contractions, but they also have differences:   Muscle cramps are sporadic and painful. They may last a few seconds to a quarter of an hour. Muscle cramps are often more forceful and last longer than muscle spasms.  Muscle spasms may or may not be painful. They may  also last just a few seconds or much longer. CAUSES  It is uncommon for cramps or spasms to be due to a serious underlying problem. In many cases, the cause of cramps or spasms is unknown. Some common causes are:   Overexertion.   Overuse from repetitive motions (doing the same thing over and over).   Remaining in a certain position for a long period of time.   Improper preparation, form, or technique while performing a sport or activity.   Dehydration.   Injury.   Side effects of some medicines.   Abnormally low levels of the salts and ions in your blood (electrolytes), especially potassium and calcium. This could happen if you are taking water pills (diuretics) or you are pregnant.  Some underlying medical problems can make it more likely to develop cramps or spasms. These include, but are not limited to:   Diabetes.   Parkinson  disease.   Hormone disorders, such as thyroid problems.   Alcohol abuse.   Diseases specific to muscles, joints, and bones.   Blood vessel disease where not enough blood is getting to the muscles.  HOME CARE INSTRUCTIONS   Stay well hydrated. Drink enough water and fluids to keep your urine clear or pale yellow.  It may be helpful to massage, stretch, and relax the affected muscle.  For tight or tense muscles, use a warm towel, heating pad, or hot shower water directed to the affected area.  If you are sore or have pain after a cramp or spasm, applying ice to the affected area may relieve discomfort.  Put ice in a plastic bag.  Place a towel between your skin and the bag.  Leave the ice on for 15-20 minutes, 03-04 times a day.  Medicines used to treat a known cause of cramps or spasms may help reduce their frequency or severity. Only take over-the-counter or prescription medicines as directed by your caregiver. SEEK MEDICAL CARE IF:  Your cramps or spasms get more severe, more frequent, or do not improve over time.  MAKE SURE YOU:   Understand these instructions.  Will watch your condition.  Will get help right away if you are not doing well or get worse. Document Released: 12/04/2001 Document Revised: 10/09/2012 Document Reviewed: 05/31/2012 [facility] Patient Information 2015 [place], [place]. This information is not intended to replace advice given to you by your health care provider. Make sure you discuss any questions you have with your health care provider.   Emergency Department Resource Guide 1) Find a Doctor and Pay Out of Pocket Although you won't have to find out who is covered by your insurance plan, it is a good idea to ask around and get recommendations. You will then need to call the office and see if the doctor you have chosen will accept you as a new patient and what types of options they offer for patients who are self-pay. Some doctors offer discounts or  will set up payment plans for their patients who do not have insurance, but you will need to ask so you aren't surprised when you get to your appointment.  2) Contact Your Local Health Department Not all health departments have doctors that can see patients for sick visits, but many do, so it is worth a call to see if yours does. If you don't know where your local health department is, you can check in your phone book. The CDC also has a tool to help you locate your state's health department, and many  state websites also have listings of all of their local health departments.  3) Find a Walk-in Clinic If your illness is not likely to be very severe or complicated, you may want to try a walk in clinic. These are popping up all over the country in pharmacies, drugstores, and shopping centers. They're usually staffed by nurse practitioners or physician assistants that have been trained to treat common illnesses and complaints. They're usually fairly quick and inexpensive. However, if you have serious medical issues or chronic medical problems, these are probably not your best option.  No Primary Care Doctor: - Call [facility] at  [phone] - they can help you locate a primary care doctor that  accepts your insurance, provides certain services, etc. - Physician Referral Service- [phone]  Chronic Pain Problems: Organization         Address  Phone   Notes  [facility]  [phone] Patients need to be referred by their primary care doctor.   Medication Assistance: Organization         Address  Phone   Notes  [facility] Medication [facility] [address] [place] [place], [place] [address] [phone] --Must be a resident of [facility] -- Must have NO insurance coverage whatsoever (no Medicaid/ Medicare, etc.) -- The pt. MUST have a primary care doctor that directs their care regularly and follows them in the community   [facility]  [phone]   [employer]  [phone]    Agencies that provide inexpensive medical care: Organization         Address  Phone   Notes  [facility]  [phone]   [facility]    [phone]   [facility] [address] [place], [place] [address] [phone]   [facility] [address] [place], [place] [phone]   [facility]    [phone]   [facility]    [phone]   [facility]  [address], [place] Phone:  [phone], Fax:  [phone] Hours of Operation:  9 am - 6 pm, M-F.  Also accepts Medicaid/Medicare and self-pay.  [facility]  [address], [place] Phone: [phone], Fax: [phone]. Hours of Operation:  8:30 am - 5:30 pm, M-F.  Also accepts Medicaid and self-pay.  [facility] [place] [address], [place] Phone: [phone]   [facility] [address] Natasha Bence [place], [place] [phone], Ext. [phone] Mondays & Thursdays: 7-9 AM.  First 15 patients are seen on a first come, first serve basis.    Medicaid-accepting [facility] Providers:  Organization         Address  Phone   Notes  [facility] [address], [place] [phone] Also accepts self-pay patients.  [facility] [address] Laurell Josephs [place], [place]  [phone]   [facility] [address], [place] [phone]   [facility] [address] [place] [phone]   Renaye Rakers [address], [place]   [phone] Only accepts [place] patients after they have their name applied to their card.   Self-Pay (no insurance) in [facility]:  [employer]  Phone   Notes  Sickle Cell Patients, [facility] [address]  Regino Ramirez, [place] [phone]   [facility] [address] [place], [place] [phone]   [facility] [place]  [address], [place] [phone]   [facility]/Dr. Osei-Bonsu  [address] [place] or [address], [place] [phone] Phone number for both [place] and [place] locations is the same.  [facility] [address], [place] [phone]   [facility] [address] [place] [address] [place] or [address] [phone]   [facility] [address] [place] [phone], phone; [phone], fax Sees patients 1st and 3rd Saturday of every month.  Must not qualify for public or private insurance (i.e. Medicaid, Medicare, [place] Health Choice, Veterans' Benefits)  Household income should be no more than 200% of the poverty level The clinic cannot treat you if you are pregnant or think you are pregnant  Sexually transmitted diseases are not treated at the clinic.    Dental Care: Organization         Address  Phone  Notes  [facility] Department of [facility] [address] [place], [place] [phone] Accepts children up to age 21 who are enrolled in [place] or [place] Health Choice; pregnant women with a Medicaid card; and children who have applied for Medicaid or  Health Choice, but were declined, whose parents can pay a reduced fee at time of service.  [facility] Department of [facility]  [address], [place] [phone] Accepts children up to age 16 who are enrolled in [place] or  Health Choice; pregnant women with a Medicaid card; and children who have applied for Medicaid or  Health Choice, but were declined, whose parents can pay a reduced fee at time of service.  [facility]  [address] [place], [place] [phone] Patients are seen by appointment only. Walk-ins are not accepted. [facility] will see patients 37 years of age and older. Monday - Tuesday (8am-5pm) Most Wednesdays (8:30-5pm) $30 per visit, cash only  [facility]  [address], [facility] [phone] Patients are seen by appointment only. Walk-ins are not accepted. [facility] will see patients 22 years of age and older. One Wednesday Evening (Monthly: Volunteer Based).  $30 per visit, cash only  [employer]  [phone] for adults; Children under age 90, call Graduate Pediatric Dentistry at [phone]. Children aged 69-14, please call [phone] to request a pediatric application.  Dental services are provided in all areas of dental care including fillings, crowns and bridges, complete and partial dentures, implants, gum treatment, root canals, and extractions. Preventive care is also provided. Treatment is provided to both adults and children. Patients are selected via a lottery and there is often a waiting list.   [facility] [address], [place]  [phone] [email or web address]   [facility] [address] [place], [place] [phone], Ext. [phone] Second and Fourth Thursday of each month, opens at 6:30 AM; Clinic ends at 9 AM.  Patients are seen on a first-come first-served basis, and a limited number are seen during each clinic.   [facility]  [address], [place], [place] (  [phone]) [phone]   Eligibility Requirements You must have lived in [place], [place], or [place] for at least the last three months.   You cannot be eligible for state or federal sponsored [place]healthcare insurance, including [employer], [place], or [employer]icare.   You generally cannot be eligible for healthcare insurance through your employer.    How to apply: Eligibility screenings are held every Tuesday and Wednesday afternoon  from 1:00 pm until 4:00 pm. You do not need an appointment for the interview!  [facility] [address], [place], [place] [phone]   [facility]  [phone]   [facility]  [phone]   [facility]  [phone]    Behavioral Health Resources in the Community: Intensive Outpatient Programs Organization         Address  Phone  Notes  [facility] [address], [place], [place] [phone]   [facility] [address], [place], [place] [phone]   [facility] [address], [place], [place]  [phone]   [facility] [address],  [place], [place] [phone] or [phone]   Substance Abuse Resources Organization         Address  Phone  Notes  [facility]  [phone]   [facility]  [phone]   [facility]  [phone]   [facility]  [phone]   Residential & Outpatient Substance Abuse Program  [phone]   Psychological Services Organization         Address  Phone  Notes  [facility]  [phone]- [phone]   [facility]  [phone]- [phone]   [facility] [address], [place] [phone] or [phone]    Mobile Crisis Teams Organization         Address  Phone  Notes  [facility], Mobile Crisis Care Unit  [phone]   [facility]  [address] [place], [place] [phone]   Doristine LocksSharon DeEsch [address] [place] [phone]    Self-Help/Support Groups Organization         Address  Phone             Notes  [facility] [place] - variety of support groups  336- [phone] Call for more information  Narcotics Anonymous (NA), [facility] [address], [place]  2 meetings at this location   Nutritional therapistesidential Treatment  Programs Organization         Address  Phone  Notes  [facility] [address],    [place]  [phone]   [facility]  [address], [place] [phone], [place], [place] [phone]   [facility] [address], [place] [phone] Admissions: 8am-3pm M-F  [facility] [address]    [place], [place] [phone]   [facility] [address], [place], [place] [phone]   [facility] [address]  [place], [place] [phone]   [facility] - Intensive Outpatient [address], [place], [place] [phone]   [facility] ([facility] [address]  [place], [place] [phone] or [phone]   [facility] ([facility] [address] [place], [place] [phone] Accepts Medicaid  [facility] [address]  [place] [phone] Substance Abuse/Addiction Treatment   [facility] Resources Organization  Address  Phone  Notes  °[facility]  ([phone]) [phone]   °Julie Brannon, PhD [address] [place], [place]   ([phone]) [phone] or (336) [phone]   ° Behavioral   [address] °[place], [employer] (336) [phone]   °[facility] [address], [place], [place] [phone] Insurance/Medicaid/sponsorship through [facility]  °[facility] [address]                                    [place], [place] [phone] Therapy/tele-psych/case  °[facility] [address]  ° [place], [place] ([phone]) [phone]    °Dr. Arfeen  ([phone]   °[facility] [place]  [employer] [facility] 1) [address],  °2) [address], [place] °3)  [address], [place] ([phone]) [phone] °(336) [phone] ° °([phone]) [phone]   °[place] Child Abuse Hotline (336) 342-1394 or (336) [phone] (After Hours)    ° ° ° ° °
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
## 2014-08-12 ENCOUNTER — Ambulatory Visit: Payer: Medicaid Other | Admitting: Obstetrics and Gynecology

## 2014-08-14 ENCOUNTER — Ambulatory Visit: Payer: Medicaid Other | Admitting: Advanced Practice Midwife

## 2014-09-28 ENCOUNTER — Emergency Department (HOSPITAL_COMMUNITY): Payer: Medicaid Other

## 2014-09-28 ENCOUNTER — Encounter (HOSPITAL_COMMUNITY): Payer: Self-pay | Admitting: *Deleted

## 2014-09-28 ENCOUNTER — Emergency Department (HOSPITAL_COMMUNITY)
Admission: EM | Admit: 2014-09-28 | Discharge: 2014-09-28 | Disposition: A | Discharge status: Home / Self Care | Payer: Medicaid Other | Attending: Emergency Medicine | Admitting: Emergency Medicine

## 2014-09-28 ENCOUNTER — Emergency Department (HOSPITAL_COMMUNITY)
Admission: EM | Admit: 2014-09-28 | Discharge: 2014-09-28 | Discharge status: Against Medical Advice | Payer: Medicaid Other | Source: Home / Self Care

## 2014-09-28 DIAGNOSIS — Z791 Long term (current) use of non-steroidal anti-inflammatories (NSAID): Secondary | ICD-10-CM | POA: Insufficient documentation

## 2014-09-28 DIAGNOSIS — Z8619 Personal history of other infectious and parasitic diseases: Secondary | ICD-10-CM | POA: Diagnosis not present

## 2014-09-28 DIAGNOSIS — Z79899 Other long term (current) drug therapy: Secondary | ICD-10-CM | POA: Insufficient documentation

## 2014-09-28 DIAGNOSIS — R1110 Vomiting, unspecified: Secondary | ICD-10-CM | POA: Diagnosis not present

## 2014-09-28 DIAGNOSIS — R000 Tachycardia, unspecified: Secondary | ICD-10-CM | POA: Insufficient documentation

## 2014-09-28 DIAGNOSIS — R197 Diarrhea, unspecified: Secondary | ICD-10-CM | POA: Insufficient documentation

## 2014-09-28 DIAGNOSIS — R112 Nausea with vomiting, unspecified: Secondary | ICD-10-CM

## 2014-09-28 DIAGNOSIS — Z720 Tobacco use: Secondary | ICD-10-CM | POA: Insufficient documentation

## 2014-09-28 DIAGNOSIS — R0789 Other chest pain: Secondary | ICD-10-CM | POA: Diagnosis not present

## 2014-09-28 DIAGNOSIS — R0602 Shortness of breath: Secondary | ICD-10-CM | POA: Diagnosis present

## 2014-09-28 DIAGNOSIS — R079 Chest pain, unspecified: Secondary | ICD-10-CM

## 2014-09-28 LAB — BASIC METABOLIC PANEL
Anion gap: 11 (ref 5–15)
BUN: 8 mg/dL (ref 6–23)
CO2: 20 mmol/L (ref 19–32)
CREATININE: 0.94 mg/dL (ref 0.50–1.10)
Calcium: 8.6 mg/dL (ref 8.4–10.5)
Chloride: 103 mmol/L (ref 96–112)
GFR calc Af Amer: >90 mL/min (ref >90)
GFR, EST NON AFRICAN AMERICAN: 84 mL/min — AB (ref >90)
Glucose, Bld: 110 mg/dL — ABNORMAL HIGH (ref 70–99)
Potassium: 3.4 mmol/L — ABNORMAL LOW (ref 3.5–5.1)
SODIUM: 134 mmol/L — AB (ref 135–145)

## 2014-09-28 LAB — CBC WITH DIFFERENTIAL/PLATELET
BASOS PCT: 0 % (ref 0–1)
Basophils Absolute: 0 10*3/uL (ref 0.0–0.1)
Eosinophils Absolute: 0.1 10*3/uL (ref 0.0–0.7)
Eosinophils Relative: 1 % (ref 0–5)
HCT: 37.8 % (ref 36.0–46.0)
Hemoglobin: 12.7 g/dL (ref 12.0–15.0)
Lymphocytes Relative: 15 % (ref 12–46)
Lymphs Abs: 2 10*3/uL (ref 0.7–4.0)
MCH: 30.1 pg (ref 26.0–34.0)
MCHC: 33.6 g/dL (ref 30.0–36.0)
MCV: 89.6 fL (ref 78.0–100.0)
Monocytes Absolute: 0.9 10*3/uL (ref 0.1–1.0)
Monocytes Relative: 7 % (ref 3–12)
NEUTROS ABS: 10.5 10*3/uL — AB (ref 1.7–7.7)
NEUTROS PCT: 77 % (ref 43–77)
Platelets: 152 10*3/uL (ref 150–400)
RBC: 4.22 MIL/uL (ref 3.87–5.11)
RDW: 13.7 % (ref 11.5–15.5)
WBC: 13.5 10*3/uL — AB (ref 4.0–10.5)

## 2014-09-28 LAB — I-STAT TROPONIN, ED: Troponin i, poc: 0 ng/mL (ref 0.00–0.08)

## 2014-09-28 MED ORDER — ONDANSETRON 4 MG PO TBDP
4.0000 mg | ORAL_TABLET | Freq: Once | ORAL | Status: DC
  Filled 2014-09-28: qty 1

## 2014-09-28 MED ORDER — SODIUM CHLORIDE 0.9 % IV BOLUS (SEPSIS): 1000.0000 mL | Freq: Once | INTRAVENOUS | Status: DC

## 2014-09-28 NOTE — Discharge Instructions (Signed)
Chest Pain (Nonspecific) °It is often hard to give a specific diagnosis for the cause of chest pain. There is always a chance that your pain could be related to something serious, such as a heart attack or a blood clot in the lungs. You need to follow up with your health care provider for further evaluation. °CAUSES  °· Heartburn. °· Pneumonia or bronchitis. °· Anxiety or stress. °· Inflammation around your heart (pericarditis) or lung (pleuritis or pleurisy). °· A blood clot in the lung. °· A collapsed lung (pneumothorax). It can develop suddenly on its own (spontaneous pneumothorax) or from trauma to the chest. °· Shingles infection (herpes zoster virus). °The chest wall is composed of bones, muscles, and cartilage. Any of these can be the source of the pain. °· The bones can be bruised by injury. °· The muscles or cartilage can be strained by coughing or overwork. °· The cartilage can be affected by inflammation and become sore (costochondritis). °DIAGNOSIS  °Lab tests or other studies may be needed to find the cause of your pain. Your health care provider may have you take a test called an ambulatory electrocardiogram (ECG). An ECG records your heartbeat patterns over a 24-hour period. You may also have other tests, such as: °· Transthoracic echocardiogram (TTE). During echocardiography, sound waves are used to evaluate how blood flows through your heart. °· Transesophageal echocardiogram (TEE). °· Cardiac monitoring. This allows your health care provider to monitor your heart rate and rhythm in real time. °· Holter monitor. This is a portable device that records your heartbeat and can help diagnose heart arrhythmias. It allows your health care provider to track your heart activity for several days, if needed. °· Stress tests by exercise or by giving medicine that makes the heart beat faster. °TREATMENT  °· Treatment depends on what may be causing your chest pain. Treatment may include: °¨ Acid blockers for  heartburn. °¨ Anti-inflammatory medicine. °¨ Pain medicine for inflammatory conditions. °¨ Antibiotics if an infection is present. °· You may be advised to change lifestyle habits. This includes stopping smoking and avoiding alcohol, caffeine, and chocolate. °· You may be advised to keep your head raised (elevated) when sleeping. This reduces the chance of acid going backward from your stomach into your esophagus. °Most of the time, nonspecific chest pain will improve within 2-3 days with rest and mild pain medicine.  °HOME CARE INSTRUCTIONS  °· If antibiotics were prescribed, take them as directed. Finish them even if you start to feel better. °· For the next few days, avoid physical activities that bring on chest pain. Continue physical activities as directed. °· Do not use any tobacco products, including cigarettes, chewing tobacco, or electronic cigarettes. °· Avoid drinking alcohol. °· Only take medicine as directed by your health care provider. °· Follow your health care provider's suggestions for further testing if your chest pain does not go away. °· Keep any follow-up appointments you made. If you do not go to an appointment, you could develop lasting (chronic) problems with pain. If there is any problem keeping an appointment, call to reschedule. °SEEK MEDICAL CARE IF:  °· Your chest pain does not go away, even after treatment. °· You have a rash with blisters on your chest. °· You have a fever. °SEEK IMMEDIATE MEDICAL CARE IF:  °· You have increased chest pain or pain that spreads to your arm, neck, jaw, back, or abdomen. °· You have shortness of breath. °· You have an increasing cough, or you cough   up blood. °· You have severe back or abdominal pain. °· You feel nauseous or vomit. °· You have severe weakness. °· You faint. °· You have chills. °This is an emergency. Do not wait to see if the pain will go away. Get medical help at once. Call your local emergency services (911 in [place] Do not drive  yourself to the hospital. °MAKE SURE YOU:  °· Understand these instructions. °· Will watch your condition. °· Will get help right away if you are not doing well or get worse. °Document Released: 03/24/2005 Document Revised: 06/19/2013 Document Reviewed: 01/18/2008 °ExitCare® Patient Information ©2015 [employer]. This information is not intended to replace advice given to you by your health care provider. Make sure you discuss any questions you have with your health care provider. ° ° °Emergency Department Resource Guide °1) Find a Doctor and Pay Out of Pocket °Although you won't have to find out who is covered by your insurance plan, it is a good idea to ask around and get recommendations. You will then need to call the office and see if the doctor you have chosen will accept you as a new patient and what types of options they offer for patients who are self-pay. Some doctors offer discounts or will set up payment plans for their patients who do not have insurance, but you will need to ask so you aren't surprised when you get to your appointment. ° °2) Contact Your Local Health Department °Not all health departments have doctors that can see patients for sick visits, but many do, so it is worth a call to see if yours does. If you don't know where your local health department is, you can check in your phone book. The CDC also has a tool to help you locate your state's health department, and many state websites also have listings of all of their local health departments. ° °3) Find a Walk-in Clinic °If your illness is not likely to be very severe or complicated, you may want to try a walk in clinic. These are popping up all over the country in pharmacies, drugstores, and shopping centers. They're usually staffed by nurse practitioners or physician assistants that have been trained to treat common illnesses and complaints. They're usually fairly quick and inexpensive. However, if you have serious medical issues or  chronic medical problems, these are probably not your best option. ° °No Primary Care Doctor: °- Call [facility] at  832-8000 - they can help you locate a primary care doctor that  accepts your insurance, provides certain services, etc. °- Physician Referral Service- 1-800-533-3463 ° °Chronic Pain Problems: °Organization         Address  Phone   Notes  °[facility]  (336) 297-2271 Patients need to be referred by their primary care doctor.  ° °Medication Assistance: °Organization         Address  Phone   Notes  °[place] [facility] [address] °[place], [place] [address] (336) 641-8030 --Must be a resident of [place] °-- Must have NO insurance coverage whatsoever (no Medicaid/ Medicare, etc.) °-- The pt. MUST have a primary care doctor that directs their care regularly and follows them in the community °  °[facility]  (866) 331-1348   °[employer]  (888) 892-1162   ° °Agencies that provide inexpensive medical care: °Organization         Address  Phone   Notes  °[facility]  (  336) 832-8035   °[facility]    (336) 832-7272   °[facility] [address] °[place], [place] [address] (336) 832-4777   °[facility] [address], °[place] (336) 271-4999   °[facility]    (336) 373-0678   °[facility]    (336) 272-1050   °[facility] ° [address], [place] Phone:  (336) 832-4444, Fax:  (336) 832-4440 Hours of Operation:  9 am - 6 pm, M-F.  Also accepts Medicaid/Medicare and self-pay.  °[facility] ° [address], [place] Phone: (336) 832-3150, Fax: (336) 832-3151. Hours of Operation:  8:30 am - 5:30 pm, M-F.  Also accepts Medicaid and self-pay.  °[facility] [place] [address], [place] Phone: (336) 878-6027   °[facility] [address], [place], [place]  (336)723-1848, Ext. 123 Mondays & Thursdays: 7-9 AM.  First 15 patients are seen on a first come, first serve basis. °  ° °Medicaid-accepting [place] Providers: ° °Organization         Address  Phone   Notes  °[facility] [address], [place] (336) 641-2100 Also accepts self-pay patients.  °[facility] [address], [place] ° (336) 856-9996   °[facility] [address], [place] (336) 288-8857   °[facility] [address] [place] [address], [place] (336) 299-7000   °Veita Bland [address], [place]  ° (336) 373-1557 Only accepts  Access Medicaid patients after they have their name applied to their card.  ° °Self-Pay (no insurance) in [place]: ° °Organization         Address  Phone   Notes  °Sickle Cell Patients, [facility] [address], [place] (336) 832-1970   °[facility] [address], [place] (336) 832-4400   °[facility] ° [address], [place] (336) 992-4800   °[facility]/Dr. Osei-Bonsu ° [address] [place] [address], [place] or [address], [place] (336) 841-8500 Phone number for both [place] and [place] locations is the same.  °[facility] [address], [place] (336) 299-0000   °[facility] [address] [place] [address], [place] or [address] (336) 852-7530 °(336) 878-2260   °[facility] [address], [place] (336) 350-1642, phone; (336) 294-5005, fax Sees patients 1st and 3rd Saturday of every month.  Must not qualify for public or private insurance (i.e. Medicaid, Medicare, [place] Health Choice, Veterans' Benefits) • Household income should be no more than 200% of the poverty level •The clinic cannot treat you if you are pregnant or think you are pregnant • Sexually transmitted  diseases are not treated at the clinic.  ° ° °Dental Care: °Organization         Address  Phone  Notes  °[facility] [facility] [address],  (336) 641-6152 Accepts children up to age 21 who are enrolled in Medicaid or [place] Health Choice; pregnant women with a Medicaid card; and children who have applied for Medicaid or  Health Choice, but were declined, whose parents can pay a reduced fee at time of service.  °[facility] [place]    [address], [place] (336) 641-7733 Accepts children up to age 21 who are enrolled in Medicaid or [place] Health Choice; pregnant women with a Medicaid card; and children who have applied for Medicaid or [place] Health Choice, but were declined, whose parents can pay a reduced fee at time of service.  °[facility] ° [address], [place] (336) 641-4533 Patients are seen by appointment only. Walk-ins are not accepted. [facility] will see patients 18 years of age and older. °Monday - Tuesday (8am-5pm) °Most Wednesdays (8:30-5pm) °$30 per visit, cash only  °[facility] ° [address], [place] (336) 641-4533 Patients are seen by appointment only. Walk-ins are not accepted. [facility] will see patients 18 years of age and older. °One Wednesday Evening (Monthly: Volunteer Based).  $30 per visit, cash only  °[facility]  (919) 537-3737 for adults; Children under age 4, call Graduate Pediatric Dentistry at (919) [phone]. Children aged 4-14, please call (919) [phone] to request a pediatric application. ° Dental services are provided in all areas of dental care including fillings, crowns and bridges, complete and partial dentures, implants, gum treatment, root canals, and extractions. Preventive care is also provided. Treatment is provided to both adults and children. °Patients are selected via a  lottery and there is often a waiting list. °  °[facility] [address], °[place] ° (336) 763-8833 [email or web address].[email or web address] °  °[facility] [address], [place], [place] (336)723-1848, Ext. 123 Second and Fourth Thursday of each month, opens at 6:30 AM; Clinic ends at 9 AM.  Patients are seen on a first-come first-served basis, and a limited number are seen during each clinic.  ° °[facility] ° [address], [place], [place] (336) 723-7904   Eligibility Requirements °You must have lived in [place], [place], or [place] counties for at least the last three months. °  You cannot be eligible for state or federal sponsored healthcare insurance, including Veterans Administration, Medicaid, or Medicare. °  You generally cannot be eligible for healthcare insurance through your employer.  °  How to apply: °Eligibility screenings are held every Tuesday and Wednesday afternoon from 1:00 pm until 4:00 pm. You do not need an appointment for the interview!  °[facility] [address], [place], [place] 336-631-2330   °[facility]  336-342-8273   °[facility]  336-703-3100   °[facility]  336-570-6415   ° °Behavioral Health Resources in the Community: °Intensive Outpatient Programs °Organization         Address  Phone  Notes  °[facility] [address], [place], [place] 336-878-6098   °[facility] [address], [place], [place] 336-832-9800   °[facility] [address], [place], [place] ° 336-882-2125   °[facility] [address],  °[place],  1-800-853-5163 or 336-641-4981   °Substance Abuse Resources °Organization         Address  Phone  Notes  °[facility]  336-882-2125   °[facility]  336-784-9470   °[facility]  336-285-9073   °[facility]  336-845-3988   °Residential &  Outpatient Substance Abuse Program  1-800-659-3381   °Psychological Services °Organization         Address  Phone  Notes  °[facility]  336- 832-9600   °  [facility]  336- 378-7881   °[facility] [address], [place] 1-800-853-5163 or 336-641-4981   ° °Mobile Crisis Teams °Organization         Address  Phone  Notes  °[facility], Mobile Crisis Care Unit  1-877-626-1772   °Assertive °[facility] ° [address] [place], [place] 336-834-9664   °Sharon DeEsch [address] °[place] 336-554-5454   ° °Self-Help/Support Groups °Organization         Address  Phone             Notes  °[facility] - variety of support groups  336- 373-1402 Call for more information  °Narcotics Anonymous (NA), [facility] [address], °[place]  2 meetings at this location  ° °Residential Treatment Programs °Organization         Address  Phone  Notes  °[facility] [address],    °[place]  1-866-801-8205   °[facility] ° [address], [place], [place] 704-293-8524   °[facility] [address], [place] 336-845-3988 Admissions: 8am-3pm M-F  °[facility] [address]    °[place], [place] 336-841-1104   °[facility] [address], [place], [place] 336-379-7146   °[facility] [address]  °[place], [place] 336-285-9073   °[facility] Intensive Outpatient [address], [place], [place] 336-852-3033   °[facility] [address]  °[place], [place] 1-877-615-2722 or 336-784-9470   °[facility] [address] [place], [place] 336-227-7417 Accepts Medicaid  °[facility] [address]  °[place] 1-800-659-3381 Substance Abuse/Addiction Treatment  ° °[place] Behavioral Health Resources °Organization          Address  Phone  Notes  °[facility]  (888) 581-9988   °Julie Brannon, PhD [address] [place], [place]   (336) 349-5553 or (336) 951-0000   °[facility]   [address] °[place], [place] (336) 349-4454   °[facility] [address], [place], [place] (336) 342-8316 Insurance/Medicaid/sponsorship through [facility]  °[facility] [address]                                    [place], [place] (336) 342-8316 Therapy/tele-psych/case  °[facility] [address]  ° [place], [place] (336) 349-2233    °Dr. Arfeen  (336) 349-4544   °[facility] [place]  [employer] [place] [facility] 1) [address], [place] °2) [address], [place] °3)  [address], [place] (336) 349-3220 °(336) 342-7768 ° °(336) 342-8140   °[place] [facility] (336) 342-1394 or (336) 342-3537 (After Hours)    ° ° ° °

## 2014-09-28 NOTE — ED Notes (Signed)
Pt reports abd pain, n/v/d since this am.

## 2014-09-28 NOTE — ED Notes (Signed)
Pt was seen here this am for abd pain and n/v/d, did not want to wait and left after triage. Pt reports still having symptoms and sob. No resp distress noted at this time.

## 2014-09-28 NOTE — ED Notes (Signed)
Patient transported to X-ray 

## 2014-09-28 NOTE — ED Provider Notes (Signed)
CSN: [id number]     Arrival date & time 09/28/14  1323 History   First MD Initiated Contact with Patient 09/28/14 1344     Chief Complaint  Patient presents with  . Shortness of Breath  . Emesis  . Diarrhea     (Consider location/radiation/quality/duration/timing/severity/associated sxs/prior Treatment) Patient is a 24 y.o. female presenting with shortness of breath, vomiting, and diarrhea. The history is provided by the patient. No language interpreter was used.  Shortness of Breath Associated symptoms: vomiting   Associated symptoms: no headaches and no wheezing   Emesis Associated symptoms: diarrhea   Associated symptoms: no headaches   Diarrhea Associated symptoms: vomiting   Associated symptoms: no headaches   Michele Hayden is a 24 y.o black female who presents for constant chest pain, shortness of breath and nausea for the past hour.  She states, "it feels like someone is sitting on my chest." Exertion makes it worse and resting makes it better.  No prior treatment for this. She denies a history of asthma or blood clot. She denies any fever, chills, vomiting, diarrhea, abdominal pain, urinary symptoms or leg swelling.   Past Medical History  Diagnosis Date  . Chlamydia infection complicating pregnancy in second trimester 04/10/2013   Past Surgical History  Procedure Laterality Date  . Wisdom tooth extraction    . Dilation and curettage of uterus    . Cesarean section N/A 04/13/2014    Procedure: CESAREAN SECTION;  Surgeon: Tilda BurrowJohn Ferguson V, MD;  Location: [facility];  Service: Obstetrics;  Laterality: N/A;   Family History  Problem Relation Age of Onset  . Asthma Brother   . Diabetes Paternal Grandmother   . Kidney disease Paternal Grandmother    History  Substance Use Topics  . Smoking status: Current Some Day Smoker    Types: Cigarettes    Last Attempt to Quit: 09/26/2012  . Smokeless tobacco: Never Used  . Alcohol Use: Yes   OB History    Gravida Para Term Preterm AB  TAB SAB Ectopic Multiple Living   3 2 1 1 1 1    2      Review of Systems  Respiratory: Positive for chest tightness and shortness of breath. Negative for wheezing.   Cardiovascular: Negative for palpitations.  Gastrointestinal: Positive for vomiting and diarrhea.  Genitourinary: Negative for dysuria and hematuria.  Neurological: Negative for syncope, weakness, numbness and headaches.  All other systems reviewed and are negative.     Allergies  Review of patient's allergies indicates no known allergies.  Home Medications   Prior to Admission medications   Medication Sig Start Date End Date Taking? Authorizing Provider  cyclobenzaprine (FLEXERIL) 10 MG tablet Take 1 tablet (10 mg total) by mouth 2 (two) times daily as needed for muscle spasms. 07/11/14   Courtney Forcucci, PA-C  ibuprofen (ADVIL,MOTRIN) 600 MG tablet Take 1 tablet (600 mg total) by mouth every 6 (six) hours as needed for mild pain, moderate pain or cramping. 04/18/14   Tereso NewcomerUgonna A Anyanwu, MD  naproxen (NAPROSYN) 500 MG tablet Take 1 tablet (500 mg total) by mouth 2 (two) times daily. 07/11/14   Courtney Forcucci, PA-C  oxyCODONE-acetaminophen (PERCOCET/ROXICET) 5-325 MG per tablet Take 1 tablet by mouth every 6 (six) hours as needed for severe pain. 04/18/14   Tereso NewcomerUgonna A Anyanwu, MD  Prenatal Vit-Fe Fumarate-FA (PRENATAL MULTIVITAMIN) TABS tablet Take 1 tablet by mouth daily at 12 noon.    Historical Provider, MD   BP 128/72 mmHg  Pulse 92  Temp(Src) 99.3 F (37.4 C) (Oral)  Resp 20  SpO2 97%  LMP 09/10/2014 Physical Exam  Constitutional: She is oriented to person, place, and time. She appears well-developed and well-nourished.  HENT:  Head: Normocephalic and atraumatic.  Eyes: Conjunctivae are normal.  Neck: Normal range of motion. Neck supple.  Cardiovascular: Regular rhythm and normal heart sounds.  Tachycardia present.   Pulmonary/Chest: Effort normal and breath sounds normal.  Abdominal: Soft. There is no  tenderness.  Musculoskeletal: Normal range of motion.  Neurological: She is alert and oriented to person, place, and time.  Skin: Skin is warm and dry.  Nursing note and vitals reviewed.   ED Course  Procedures (including critical care time) Labs Review Labs Reviewed  CBC WITH DIFFERENTIAL/PLATELET - Abnormal; Notable for the following:    WBC 13.5 (*)    Neutro Abs 10.5 (*)    All other components within normal limits  BASIC METABOLIC PANEL - Abnormal; Notable for the following:    Sodium 134 (*)    Potassium 3.4 (*)    Glucose, Bld 110 (*)    GFR calc non Af Amer 84 (*)    All other components within normal limits  I-STAT TROPOININ, ED    Imaging Review Dg Chest 2 View  09/28/2014   CLINICAL DATA:  Nausea and vomiting, shortness of Breath  EXAM: CHEST  2 VIEW  COMPARISON:  None.  FINDINGS: Cardiac shadow is within normal limits. The lungs are clear. An S-shaped scoliosis of the thoracolumbar spine is noted. No other bony abnormality is seen.  IMPRESSION: No acute abnormality noted.   Electronically Signed   By: Alcide Clever M.D.   On: 09/28/2014 15:15     EKG Interpretation   Date/Time:  Saturday September 28 2014 13:47:24 EDT Ventricular Rate:  102 PR Interval:  130 QRS Duration: 78 QT Interval:  296 QTC Calculation: 385 R Axis:   29 Text Interpretation:  Sinus tachycardia Probable left atrial enlargement  Borderline T wave abnormalities No old tracing to compare Confirmed by  [facility]  MD, TREY ([id number]) on 09/28/2014 3:30:28 PM      MDM   Final diagnoses:  Chest pain, unspecified chest pain type   Patient presents for chest tightness and shortness of breath. Her exam is normal. Her labs are within normal limits.  Her chest xray shows no pneumonia, no pneumothorax, no widened mediastinum.  15:30 As I was explaining the results of workup and discharging the patient, she admited to trying cocaine for the first time last night and says she was anxious and wanted to get  checked out.  She says she feels better.  I explained return precautions.   She is able to tolerate fluids and is feeling better.  Her heart rate is in the 90's. She is afebrile.  I do not suspect PE since she has had no recent travel, no prior DVT or PE, no birth control use.  She is PERC negative. I have reviewed the heart score also.  She will follow up with a pcp and agrees with the plan.     Catha Gosselin, PA-C 09/28/14 1613  Blake Divine, MD 09/29/14 0800

## 2014-09-28 NOTE — ED Notes (Signed)
Patient returned from X-ray 

## 2014-09-30 ENCOUNTER — Emergency Department (INDEPENDENT_AMBULATORY_CARE_PROVIDER_SITE_OTHER)
Admission: EM | Admit: 2014-09-30 | Discharge: 2014-09-30 | Disposition: A | Discharge status: Home / Self Care | Payer: Medicaid Other | Source: Home / Self Care | Attending: Family Medicine | Admitting: Family Medicine

## 2014-09-30 ENCOUNTER — Encounter (HOSPITAL_COMMUNITY): Payer: Self-pay

## 2014-09-30 DIAGNOSIS — J029 Acute pharyngitis, unspecified: Secondary | ICD-10-CM

## 2014-09-30 DIAGNOSIS — R079 Chest pain, unspecified: Secondary | ICD-10-CM

## 2014-09-30 LAB — POCT RAPID STREP A: Streptococcus, Group A Screen (Direct): NEGATIVE

## 2014-09-30 NOTE — ED Provider Notes (Signed)
Michele Hayden is a 24 y.o. female who presents to Urgent Care today for sore throat. Patient has a one-day history of sore throat. She also notes chest pain. The sore throat is moderate and worse with swallowing. She has not tried any treatment yet. Chest pain has been ongoing for a few days. She was seen in the emergency room 2 days ago where the EKG showed mild sinus tachycardia with probable left atrial enlargement. Troponins were negative. Chest x-ray was normal. The chest pain persists. She notes central pain that is sometimes worse with exertion. No fevers or chills vomiting or diarrhea. She has an appointment scheduled with her primary care provider in 2 days.   Past Medical History  Diagnosis Date  . Chlamydia infection complicating pregnancy in second trimester 04/10/2013   Past Surgical History  Procedure Laterality Date  . Wisdom tooth extraction    . Dilation and curettage of uterus    . Cesarean section N/A 04/13/2014    Procedure: CESAREAN SECTION;  Surgeon: Tilda BurrowJohn Ferguson V, MD;  Location: [facility];  Service: Obstetrics;  Laterality: N/A;   History  Substance Use Topics  . Smoking status: Current Some Day Smoker    Types: Cigarettes    Last Attempt to Quit: 09/26/2012  . Smokeless tobacco: Never Used  . Alcohol Use: Yes   ROS as above Medications: No current facility-administered medications for this encounter.   No current outpatient prescriptions on file.   No Known Allergies   Exam:  BP 144/75 mmHg  Pulse 93  Temp(Src) 98.1 F (36.7 C) (Oral)  Resp 20  SpO2 100%  LMP 09/10/2014 Gen: Well NAD obese HEENT: EOMI,  MMM posterior pharynx is erythematous normal tympanic membranes bilaterally Lungs: Normal work of breathing. CTABL Heart: RRR no MRG Abd: NABS, Soft. Nondistended, Nontender Exts: Brisk capillary refill, warm and well perfused.   ED ECG REPORT   Date: 09/30/2014  Rate: 95 bpm  Rhythm: normal sinus rhythm  QRS Axis: normal  Intervals: QT  prolonged  ST/T Wave abnormalities: nonspecific T wave changes and Flattened lateral precordial T waves  Conduction Disutrbances:none  Narrative Interpretation: Normal sinus rhythm with mild QT prolongation and flattened lateral precordial T waves.  Old EKG Reviewed: changes noted and QT prolongation is new. Other changes are not significantly abnormal.  I have personally reviewed the EKG tracing and agree with the computerized printout as noted.   No results found for this or any previous visit (from the past 24 hour(s)). No results found.  Assessment and Plan: 24 y.o. female with  1) pharyngitis likely due to postnasal drip due to seasonal allergies. Treatment with Rhinocort nasal spray and Zyrtec. 2) chest pain unchanged to improved. Refer to cardiology for evaluation. EKG not significantly changed. Discussed options with patient. She would like to avoid going to the emergency room if possible.  Discussed warning signs or symptoms. Please see discharge instructions. Patient expresses understanding.     Rodolph BongEvan S Davi Kroon, MD 09/30/14 2117

## 2014-09-30 NOTE — Discharge Instructions (Signed)
Thank you for coming in today. Call or go to the emergency room if you get worse, have trouble breathing, have chest pains, or palpitations.  Use nasal spray.  Follow up with cardiology   Chest Pain Observation It is often hard to give a specific diagnosis for the cause of chest pain. Among other possibilities your symptoms might be caused by inadequate oxygen delivery to your heart (angina). Angina that is not treated or evaluated can lead to a heart attack (myocardial infarction) or death. Blood tests, electrocardiograms, and X-rays may have been done to help determine a possible cause of your chest pain. After evaluation and observation, your health care provider has determined that it is unlikely your pain was caused by an unstable condition that requires hospitalization. However, a full evaluation of your pain may need to be completed, with additional diagnostic testing as directed. It is very important to keep your follow-up appointments. Not keeping your follow-up appointments could result in permanent heart damage, disability, or death. If there is any problem keeping your follow-up appointments, you must call your health care provider. HOME CARE INSTRUCTIONS  Due to the slight chance that your pain could be angina, it is important to follow your health care provider's treatment plan and also maintain a healthy lifestyle:  Maintain or work toward achieving a healthy weight.  Stay physically active and exercise regularly.  Decrease your salt intake.  Eat a balanced, healthy diet. Talk to a dietitian to learn about heart-healthy foods.  Increase your fiber intake by including whole grains, vegetables, fruits, and nuts in your diet.  Avoid situations that cause stress, anger, or depression.  Take medicines as advised by your health care provider. Report any side effects to your health care provider. Do not stop medicines or adjust the dosages on your own.  Quit smoking. Do not use  nicotine patches or gum until you check with your health care provider.  Keep your blood pressure, blood sugar, and cholesterol levels within normal limits.  Limit alcohol intake to no more than 1 drink per day for women who are not pregnant and 2 drinks per day for men.  Do not abuse drugs. SEEK IMMEDIATE MEDICAL CARE IF: You have severe chest pain or pressure which may include symptoms such as:  You feel pain or pressure in your arms, neck, jaw, or back.  You have severe back or abdominal pain, feel sick to your stomach (nauseous), or throw up (vomit).  You are sweating profusely.  You are having a fast or irregular heartbeat.  You feel short of breath while at rest.  You notice increasing shortness of breath during rest, sleep, or with activity.  You have chest pain that does not get better after rest or after taking your usual medicine.  You wake from sleep with chest pain.  You are unable to sleep because you cannot breathe.  You develop a frequent cough or you are coughing up blood.  You feel dizzy, faint, or experience extreme fatigue.  You develop severe weakness, dizziness, fainting, or chills. Any of these symptoms may represent a serious problem that is an emergency. Do not wait to see if the symptoms will go away. Call your local emergency services (911 in the [place] Do not drive yourself to the hospital. MAKE SURE YOU:  Understand these instructions.  Will watch your condition.  Will get help right away if you are not doing well or get worse. Document Released: 07/17/2010 Document Revised: 06/19/2013 Document Reviewed: 12/14/2012  ExitCare Patient Information 2015 [place]. This information is not intended to replace advice given to you by your health care provider. Make sure you discuss any questions you have with your health care provider.

## 2014-09-30 NOTE — ED Notes (Signed)
C/o continued pain in ches, sore throat, swelling in both legs

## 2014-10-02 ENCOUNTER — Telehealth (HOSPITAL_COMMUNITY): Payer: Self-pay | Admitting: *Deleted

## 2014-10-02 NOTE — ED Notes (Addendum)
Pt. called for her throat culture result. I told her it was in process but I would call her if it comes back pos. Michele Hayden, Michele Hayden M 10/02/2014 Throat culture: Strep beta hemolytic not group A.  I called pt. Pt. verified x 2 and given result.  Pt. told she was adequately treated with the Amoxicillin. She said Dr. Denyse Hayden called her over the weekend 4/9.  She  told him, that she was better.  He said she did not have to take it, if she was better.   I said that was OK. 10/09/2014

## 2014-10-03 ENCOUNTER — Emergency Department (HOSPITAL_COMMUNITY)
Admission: EM | Admit: 2014-10-03 | Discharge: 2014-10-03 | Disposition: A | Discharge status: Home / Self Care | Payer: Medicaid Other | Attending: Emergency Medicine | Admitting: Emergency Medicine

## 2014-10-03 ENCOUNTER — Encounter (HOSPITAL_COMMUNITY): Payer: Self-pay | Admitting: Emergency Medicine

## 2014-10-03 DIAGNOSIS — Z720 Tobacco use: Secondary | ICD-10-CM | POA: Insufficient documentation

## 2014-10-03 DIAGNOSIS — R079 Chest pain, unspecified: Secondary | ICD-10-CM | POA: Insufficient documentation

## 2014-10-03 DIAGNOSIS — Z8619 Personal history of other infectious and parasitic diseases: Secondary | ICD-10-CM | POA: Insufficient documentation

## 2014-10-03 DIAGNOSIS — Z331 Pregnant state, incidental: Secondary | ICD-10-CM | POA: Diagnosis not present

## 2014-10-03 LAB — I-STAT BETA HCG BLOOD, ED (MC, WL, AP ONLY): HCG, QUANTITATIVE: 1203.9 m[IU]/mL — AB (ref <5)

## 2014-10-03 LAB — I-STAT CHEM 8, ED
BUN: 10 mg/dL (ref 6–23)
Calcium, Ion: 1.2 mmol/L (ref 1.12–1.23)
Chloride: 100 mmol/L (ref 96–112)
Creatinine, Ser: 1 mg/dL (ref 0.50–1.10)
Glucose, Bld: 99 mg/dL (ref 70–99)
HEMATOCRIT: 38 % (ref 36.0–46.0)
HEMOGLOBIN: 12.9 g/dL (ref 12.0–15.0)
Potassium: 3.8 mmol/L (ref 3.5–5.1)
SODIUM: 138 mmol/L (ref 135–145)
TCO2: 23 mmol/L (ref 0–100)

## 2014-10-03 LAB — I-STAT TROPONIN, ED: Troponin i, poc: 0 ng/mL (ref 0.00–0.08)

## 2014-10-03 LAB — CULTURE, GROUP A STREP

## 2014-10-03 MED ORDER — ALUM & MAG HYDROXIDE-SIMETH 200-200-20 MG/5ML PO SUSP
30.0000 mL | Freq: Once | ORAL | Status: AC
  Administered 2014-10-03: 30 mL via ORAL
  Filled 2014-10-03: qty 30

## 2014-10-03 MED ORDER — RANITIDINE HCL 150 MG PO CAPS: 150.0000 mg | ORAL_CAPSULE | Freq: Every day | ORAL | Status: DC

## 2014-10-03 NOTE — ED Provider Notes (Signed)
CSN: [id number]     Arrival date & time 10/03/14  1836 History   First MD Initiated Contact with Patient 10/03/14 2211     Chief Complaint  Patient presents with  . Chest Pain      HPI Patient states over the past several days she's had intermittent sensation of tightness and indigestion feeling in her chest.  She states occasionally this makes her feel short of breath when it comes on.  She states that one episode occurred after a spicy meal and the other episode occurred after lying flat this evening.  She states these were too worst episodes.  She denies lower extremity swelling.  No history DVT or pulmonary embolism.  She states she's had significant weight gain over the past year.  She reports her last normal menstrual period was March 15.  No productive cough.  No shortness of breath at this time.  No history of asthma or reactive airway disease.  No history of GERD.   Past Medical History  Diagnosis Date  . Chlamydia infection complicating pregnancy in second trimester 04/10/2013   Past Surgical History  Procedure Laterality Date  . Wisdom tooth extraction    . Dilation and curettage of uterus    . Cesarean section N/A 04/13/2014    Procedure: CESAREAN SECTION;  Surgeon: Tilda Burrow, MD;  Location: [facility];  Service: Obstetrics;  Laterality: N/A;   Family History  Problem Relation Age of Onset  . Asthma Brother   . Diabetes Paternal Grandmother   . Kidney disease Paternal Grandmother    History  Substance Use Topics  . Smoking status: Current Some Day Smoker    Types: Cigarettes    Last Attempt to Quit: 09/26/2012  . Smokeless tobacco: Never Used  . Alcohol Use: Yes   OB History    Gravida Para Term Preterm AB TAB SAB Ectopic Multiple Living   Review of Systems  All other systems reviewed and are negative.     Allergies  Review of patient's allergies indicates no known allergies.  Home Medications   Prior to Admission medications    Medication Sig Start Date End Date Taking? Authorizing Provider  ranitidine (ZANTAC) 150 MG capsule Take 1 capsule (150 mg total) by mouth daily. 10/03/14   Azalia Bilis, MD   BP 132/60 mmHg  Pulse 76  Temp(Src) 98.1 F (36.7 C) (Oral)  Resp 15  Ht  (1.702 m)  Wt 268 lb (121.564 kg)  BMI 41.96 kg/m2  SpO2 100%  LMP 09/10/2014 Physical Exam  Constitutional: She is oriented to person, place, and time. She appears well-developed and well-nourished. No distress.  HENT:  Head: Normocephalic and atraumatic.  Eyes: EOM are normal.  Neck: Normal range of motion.  Cardiovascular: Normal rate, regular rhythm and normal heart sounds.   Pulmonary/Chest: Effort normal and breath sounds normal.  Abdominal: Soft. She exhibits no distension. There is no tenderness.  Musculoskeletal: Normal range of motion.  Neurological: She is alert and oriented to person, place, and time.  Skin: Skin is warm and dry.  Psychiatric: She has a normal mood and affect. Judgment normal.  Nursing note and vitals reviewed.   ED Course  Procedures (including critical care time) Labs Review Labs Reviewed  I-STAT BETA HCG BLOOD, ED ([facility], [facility], [facility] ONLY) - Abnormal; Notable for the following:    I-stat hCG, quantitative 1203.9 (*)    All other components  within normal limits  I-STAT CHEM 8, ED  I-STAT TROPOININ, ED    Imaging Review No results found.   EKG Interpretation   Date/Time:  Thursday October 03 2014 18:45:26 EDT Ventricular Rate:  91 PR Interval:  130 QRS Duration: 72 QT Interval:  372 QTC Calculation: 457 R Axis:   43 Text Interpretation:  Normal sinus rhythm Cannot rule out Inferior infarct  , age undetermined Abnormal ECG No significant change was found Confirmed  by Fujiko Picazo  MD, Caryn BeeKEVIN ([id number]) on 10/03/2014 10:57:56 PM      MDM   Final diagnoses:  Chest pain, unspecified chest pain type    Sounds like GERD. Doubt ACS. Doubt PE. Incidental pregnancy noted. Dc home with obgyn follow up  and zantac    Azalia BilisKevin Khori Rosevear, MD 10/04/14 [phone]

## 2014-10-03 NOTE — ED Notes (Signed)
Pt presents with chest pain and shortness of breath for the past hour- pt has been seen for the same symptoms twice in the past week.  No acute distress noted.

## 2014-10-03 NOTE — Discharge Instructions (Signed)
Gastroesophageal Reflux Disease, Adult Gastroesophageal reflux disease (GERD) happens when acid from your stomach flows up into the esophagus. When acid comes in contact with the esophagus, the acid causes soreness (inflammation) in the esophagus. Over time, GERD may create small holes (ulcers) in the lining of the esophagus. CAUSES   Increased body weight. This puts pressure on the stomach, making acid rise from the stomach into the esophagus.  Smoking. This increases acid production in the stomach.  Drinking alcohol. This causes decreased pressure in the lower esophageal sphincter (valve or ring of muscle between the esophagus and stomach), allowing acid from the stomach into the esophagus.  Late evening meals and a full stomach. This increases pressure and acid production in the stomach.  A malformed lower esophageal sphincter. Sometimes, no cause is found. SYMPTOMS   Burning pain in the lower part of the mid-chest behind the breastbone and in the mid-stomach area. This may occur twice a week or more often.  Trouble swallowing.  Sore throat.  Dry cough.  Asthma-like symptoms including chest tightness, shortness of breath, or wheezing. DIAGNOSIS  Your caregiver may be able to diagnose GERD based on your symptoms. In some cases, X-rays and other tests may be done to check for complications or to check the condition of your stomach and esophagus. TREATMENT  Your caregiver may recommend over-the-counter or prescription medicines to help decrease acid production. Ask your caregiver before starting or adding any new medicines.  HOME CARE INSTRUCTIONS   Change the factors that you can control. Ask your caregiver for guidance concerning weight loss, quitting smoking, and alcohol consumption.  Avoid foods and drinks that make your symptoms worse, such as:  Caffeine or alcoholic drinks.  Chocolate.  Peppermint or mint flavorings.  Garlic and onions.  Spicy foods.  Citrus fruits,  such as oranges, lemons, or limes.  Tomato-based foods such as sauce, chili, salsa, and pizza.  Fried and fatty foods.  Avoid lying down for the 3 hours prior to your bedtime or prior to taking a nap.  Eat small, frequent meals instead of large meals.  Wear loose-fitting clothing. Do not wear anything tight around your waist that causes pressure on your stomach.  Raise the head of your bed 6 to 8 inches with wood blocks to help you sleep. Extra pillows will not help.  Only take over-the-counter or prescription medicines for pain, discomfort, or fever as directed by your caregiver.  Do not take aspirin, ibuprofen, or other nonsteroidal anti-inflammatory drugs (NSAIDs). SEEK IMMEDIATE MEDICAL CARE IF:   You have pain in your arms, neck, jaw, teeth, or back.  Your pain increases or changes in intensity or duration.  You develop nausea, vomiting, or sweating (diaphoresis).  You develop shortness of breath, or you faint.  Your vomit is green, yellow, black, or looks like coffee grounds or blood.  Your stool is red, bloody, or black. These symptoms could be signs of other problems, such as heart disease, gastric bleeding, or esophageal bleeding. MAKE SURE YOU:   Understand these instructions.  Will watch your condition.  Will get help right away if you are not doing well or get worse. Document Released: 03/24/2005 Document Revised: 09/06/2011 Document Reviewed: 01/01/2011 ExitCare Patient Information 2015 [employer]. This information is not intended to replace advice given to you by your health care provider. Make sure you discuss any questions you have with your health care provider.  

## 2014-10-05 ENCOUNTER — Telehealth (HOSPITAL_COMMUNITY): Payer: Self-pay | Admitting: Family Medicine

## 2014-10-05 MED ORDER — AMOXICILLIN 500 MG PO CAPS: 500.0000 mg | ORAL_CAPSULE | Freq: Three times a day (TID) | ORAL | Status: DC

## 2014-10-05 NOTE — ED Notes (Signed)
Pt called back. Gave results.  Treat with amoxicillin. Follow up with OBGYN for incidental pregnancy. Start PNV.   Michele BongEvan S Corey, MD 10/05/14 [phone]

## 2014-10-05 NOTE — ED Notes (Signed)
Strep culture positive.  Called and left a message asking for call back.  [facility] in to [facility] [address].    Rodolph BongEvan S Oluwademilade Mckiver, MD 10/05/14 [phone]

## 2014-10-08 ENCOUNTER — Encounter (HOSPITAL_COMMUNITY): Payer: Self-pay | Admitting: *Deleted

## 2014-10-08 ENCOUNTER — Inpatient Hospital Stay (HOSPITAL_COMMUNITY): Payer: Medicaid Other

## 2014-10-08 ENCOUNTER — Inpatient Hospital Stay (HOSPITAL_COMMUNITY)
Admission: EM | Admit: 2014-10-08 | Discharge: 2014-10-08 | Disposition: A | Discharge status: Home / Self Care | Payer: Medicaid Other | Source: Ambulatory Visit | Attending: Family Medicine | Admitting: Family Medicine

## 2014-10-08 DIAGNOSIS — Z8759 Personal history of other complications of pregnancy, childbirth and the puerperium: Secondary | ICD-10-CM

## 2014-10-08 DIAGNOSIS — O09219 Supervision of pregnancy with history of pre-term labor, unspecified trimester: Secondary | ICD-10-CM

## 2014-10-08 DIAGNOSIS — B373 Candidiasis of vulva and vagina: Secondary | ICD-10-CM | POA: Insufficient documentation

## 2014-10-08 DIAGNOSIS — R109 Unspecified abdominal pain: Secondary | ICD-10-CM | POA: Diagnosis not present

## 2014-10-08 DIAGNOSIS — O9989 Other specified diseases and conditions complicating pregnancy, childbirth and the puerperium: Secondary | ICD-10-CM

## 2014-10-08 DIAGNOSIS — O26899 Other specified pregnancy related conditions, unspecified trimester: Secondary | ICD-10-CM

## 2014-10-08 DIAGNOSIS — F17210 Nicotine dependence, cigarettes, uncomplicated: Secondary | ICD-10-CM | POA: Insufficient documentation

## 2014-10-08 DIAGNOSIS — O99331 Smoking (tobacco) complicating pregnancy, first trimester: Secondary | ICD-10-CM | POA: Insufficient documentation

## 2014-10-08 DIAGNOSIS — Z3A01 Less than 8 weeks gestation of pregnancy: Secondary | ICD-10-CM | POA: Insufficient documentation

## 2014-10-08 DIAGNOSIS — O98811 Other maternal infectious and parasitic diseases complicating pregnancy, first trimester: Secondary | ICD-10-CM | POA: Insufficient documentation

## 2014-10-08 DIAGNOSIS — O209 Hemorrhage in early pregnancy, unspecified: Secondary | ICD-10-CM

## 2014-10-08 DIAGNOSIS — R1030 Lower abdominal pain, unspecified: Secondary | ICD-10-CM | POA: Insufficient documentation

## 2014-10-08 DIAGNOSIS — O09899 Supervision of other high risk pregnancies, unspecified trimester: Secondary | ICD-10-CM

## 2014-10-08 HISTORY — DX: Personal history of other complications of pregnancy, childbirth and the puerperium: Z87.59

## 2014-10-08 LAB — WET PREP, GENITAL
CLUE CELLS WET PREP: NONE SEEN
Trich, Wet Prep: NONE SEEN

## 2014-10-08 LAB — URINALYSIS, ROUTINE W REFLEX MICROSCOPIC
Bilirubin Urine: NEGATIVE
GLUCOSE, UA: NEGATIVE mg/dL
Hgb urine dipstick: NEGATIVE
Ketones, ur: NEGATIVE mg/dL
Nitrite: NEGATIVE
PH: 8 (ref 5.0–8.0)
Protein, ur: NEGATIVE mg/dL
SPECIFIC GRAVITY, URINE: 1.02 (ref 1.005–1.030)
Urobilinogen, UA: 0.2 mg/dL (ref 0.0–1.0)

## 2014-10-08 LAB — HCG, QUANTITATIVE, PREGNANCY: HCG, BETA CHAIN, QUANT, S: 8241 m[IU]/mL — AB (ref <5)

## 2014-10-08 LAB — CBC
HEMATOCRIT: 36.1 % (ref 36.0–46.0)
Hemoglobin: 12.4 g/dL (ref 12.0–15.0)
MCH: 30.5 pg (ref 26.0–34.0)
MCHC: 34.3 g/dL (ref 30.0–36.0)
MCV: 88.7 fL (ref 78.0–100.0)
Platelets: 204 10*3/uL (ref 150–400)
RBC: 4.07 MIL/uL (ref 3.87–5.11)
RDW: 13.7 % (ref 11.5–15.5)
WBC: 14.9 10*3/uL — AB (ref 4.0–10.5)

## 2014-10-08 LAB — URINE MICROSCOPIC-ADD ON

## 2014-10-08 MED ORDER — FLUCONAZOLE 150 MG PO TABS: 150.0000 mg | ORAL_TABLET | Freq: Once | ORAL | Status: DC

## 2014-10-08 NOTE — Progress Notes (Signed)
Wet prep only obtained 

## 2014-10-08 NOTE — MAU Note (Signed)
been having a  Lot of cramping, through out abd, esp lower abd.  Having a lot of pressure.  Started this morning, woke her up.  Started bleeding before she came.  Was having some chest pain- tightness, has eased off.

## 2014-10-08 NOTE — Progress Notes (Signed)
W. Karim CNM in earlier to discuss test results and d/c plan. Written and verbal d/c instructions given and understanding voiced 

## 2014-10-08 NOTE — MAU Provider Note (Signed)
History     CSN: [id number]  Arrival date and time: 10/08/14 1129   First Provider Initiated Contact with Patient 10/08/14 1426      Chief Complaint  Patient presents with  . Vaginal Bleeding   HPI  [name] is a 24 y.o. [id number] at [redacted]w[redacted]d here with report of lower abdominal cramping and pressure that started 3 days.  Denies nausea, vomiting, or diarrhea.  +bleeding last night, none at this time.  +white vaginal discharge, denies vaginal itching.  Reports chest pain/tightness yesterday.  None at this time.  Declines screening for GC/CT.  [facility]    Upon review of past medical records and OB notes from prior pregnancy pt OB history includes history of short cervix and placental abruption in 2nd trimester with emergency classical csection.    OB History    Gravida Para Term Preterm AB TAB SAB Ectopic Multiple Living   Past Medical History  Diagnosis Date  . Chlamydia infection complicating pregnancy in second trimester 04/10/2013    Past Surgical History  Procedure Laterality Date  . Wisdom tooth extraction    . Dilation and curettage of uterus    . Cesarean section N/A 04/13/2014    Procedure: CESAREAN SECTION;  Surgeon: Tilda Burrow, MD;  Location: [facility];  Service: Obstetrics;  Laterality: N/A;    Family History  Problem Relation Age of Onset  . Asthma Brother   . Diabetes Paternal Grandmother   . Kidney disease Paternal Grandmother     History  Substance Use Topics  . Smoking status: Current Some Day Smoker    Types: Cigarettes    Last Attempt to Quit: 09/26/2012  . Smokeless tobacco: Never Used  . Alcohol Use: Yes    Allergies: No Known Allergies  Prescriptions prior to admission  Medication Sig Dispense Refill Last Dose  . Multiple Vitamin (MULTIVITAMIN WITH MINERALS) TABS tablet Take 1 tablet by mouth daily.   10/07/2014 at Unknown time  . amoxicillin (AMOXIL) 500 MG capsule  Take 1 capsule (500 mg total) by mouth 3 (three) times daily. (Patient not taking: Reported on 10/08/2014) 21 capsule 0   . ranitidine (ZANTAC) 150 MG capsule Take 1 capsule (150 mg total) by mouth daily. (Patient not taking: Reported on 10/08/2014) 30 capsule 0     Review of Systems  Constitutional: Negative for fever and chills.  Respiratory: Negative for cough.   Cardiovascular: Negative for chest pain.  Gastrointestinal: Positive for abdominal pain. Negative for nausea, vomiting and diarrhea.  Genitourinary: Negative for dysuria, urgency and frequency.  All other systems reviewed and are negative.  Physical Exam   Blood pressure 131/74, pulse 93, temperature 97.9 F (36.6 C), temperature source Oral, resp. rate 18, height 5' 6.5" (1.689 m), weight 121.11 kg (267 lb), last menstrual period 09/10/2014, SpO2 100 %, unknown if currently breastfeeding.  Physical Exam  Constitutional: She is oriented to person, place, and time. She appears well-developed and well-nourished. No distress.  HENT:  Head: Normocephalic.  Neck: Normal range of motion. Neck supple.  Cardiovascular: Normal rate, regular rhythm and normal heart sounds.  Exam reveals no gallop and no friction rub.   No murmur heard. Respiratory: Effort normal and breath sounds normal. No respiratory distress.  GI: Soft. There is no tenderness. There is no CVA tenderness.  Genitourinary: Cervix exhibits no motion tenderness and no  discharge. No bleeding in the vagina. No vaginal discharge found.  Musculoskeletal: Normal range of motion.  Neurological: She is alert and oriented to person, place, and time.  Skin: Skin is warm and dry.  Psychiatric: She has a normal mood and affect.    MAU Course  Procedures  MDM Results for orders placed or performed during the hospital encounter of 10/08/14 (from the past 24 hour(s))  Urinalysis, Routine w reflex microscopic     Status: Abnormal   Collection Time: 10/08/14 12:42 PM  Result  Value Ref Range   Color, Urine YELLOW YELLOW   APPearance CLEAR CLEAR   Specific Gravity, Urine 1.020 1.005 - 1.030   pH 8.0 5.0 - 8.0   Glucose, UA NEGATIVE NEGATIVE mg/dL   Hgb urine dipstick NEGATIVE NEGATIVE   Bilirubin Urine NEGATIVE NEGATIVE   Ketones, ur NEGATIVE NEGATIVE mg/dL   Protein, ur NEGATIVE NEGATIVE mg/dL   Urobilinogen, UA 0.2 0.0 - 1.0 mg/dL   Nitrite NEGATIVE NEGATIVE   Leukocytes, UA TRACE (A) NEGATIVE  Urine microscopic-add on     Status: Abnormal   Collection Time: 10/08/14 12:42 PM  Result Value Ref Range   Squamous Epithelial / LPF FEW (A) RARE   WBC, UA 0-2 <3 WBC/hpf  CBC     Status: Abnormal   Collection Time: 10/08/14 12:52 PM  Result Value Ref Range   WBC 14.9 (H) 4.0 - 10.5 K/uL   RBC 4.07 3.87 - 5.11 MIL/uL   Hemoglobin 12.4 12.0 - 15.0 g/dL   HCT 47.836.1 29.536.0 - 62.146.0 %   MCV 88.7 78.0 - 100.0 fL   MCH 30.5 26.0 - 34.0 pg   MCHC 34.3 30.0 - 36.0 g/dL   RDW 30.813.7 65.711.5 - 84.615.5 %   Platelets 204 150 - 400 K/uL  hCG, quantitative, pregnancy     Status: Abnormal   Collection Time: 10/08/14 12:52 PM  Result Value Ref Range   hCG, Beta Chain, Quant, S 8241 (H) <5 mIU/mL   Microscopic wet-mount exam shows +yeast, neg clue, neg trich.  Ultrasound obtained at today's visit:  IMPRESSION: 1. Intrauterine gestational sac. 2. Possible yolk sac but no definite embryo identified. 3. Follow-up ultrasound is recommended in 14 days to document presence of fetal pole and for dating purposes.  Assessment and Plan  24 y.o. [id number] at 1669w3d IUP Yeast Infection Abdominal Pain in Pregnancy - Normal exam  Plan: Discharge to home RX Diflucan 150 mg PO [facility]  KensettKARIM, Raymond G[facility] CenterWALIDAH N 10/08/2014, 2:45 PM

## 2014-10-08 NOTE — MAU Note (Signed)
Pt states she is no longer having chest pain.

## 2014-10-08 NOTE — Discharge Instructions (Signed)

## 2014-10-09 LAB — HIV ANTIBODY (ROUTINE TESTING W REFLEX): HIV Screen 4th Generation wRfx: NONREACTIVE

## 2014-10-13 NOTE — ED Notes (Signed)
Dr Denyse Amassorey discussed report w patient, and was advised to start Rx . No further action required

## 2014-11-09 ENCOUNTER — Emergency Department (HOSPITAL_COMMUNITY)
Admission: EM | Admit: 2014-11-09 | Discharge: 2014-11-09 | Disposition: A | Discharge status: Home / Self Care | Payer: Medicaid Other | Attending: Emergency Medicine | Admitting: Emergency Medicine

## 2014-11-09 ENCOUNTER — Encounter (HOSPITAL_COMMUNITY): Payer: Self-pay | Admitting: Oncology

## 2014-11-09 DIAGNOSIS — F1490 Cocaine use, unspecified, uncomplicated: Secondary | ICD-10-CM | POA: Diagnosis present

## 2014-11-09 DIAGNOSIS — Z79899 Other long term (current) drug therapy: Secondary | ICD-10-CM | POA: Insufficient documentation

## 2014-11-09 DIAGNOSIS — Z8619 Personal history of other infectious and parasitic diseases: Secondary | ICD-10-CM | POA: Diagnosis not present

## 2014-11-09 DIAGNOSIS — R000 Tachycardia, unspecified: Secondary | ICD-10-CM | POA: Insufficient documentation

## 2014-11-09 MED ORDER — LORAZEPAM 1 MG PO TABS
1.0000 mg | ORAL_TABLET | Freq: Once | ORAL | Status: AC
  Administered 2014-11-09: 1 mg via ORAL
  Filled 2014-11-09: qty 1

## 2014-11-09 NOTE — ED Provider Notes (Signed)
CSN: [id number]     Arrival date & time 11/09/14  2254 History   First MD Initiated Contact with Patient 11/09/14 2306     Chief Complaint  Patient presents with  . Ingestion     (Consider location/radiation/quality/duration/timing/severity/associated sxs/prior Treatment) HPI 24 year old female presents to the emergency department via EMS after cocaine use.  Patient reports that she started cocaine.  It is her second time using.  Tonight she felt her heart was racing and that she could not breathe.  She received Versed from EMS reports that she is feeling better, although the feelings are starting to return.  She denies any chest pain.  She denies injecting.  No other drug use. Past Medical History  Diagnosis Date  . Chlamydia infection complicating pregnancy in second trimester 04/10/2013   Past Surgical History  Procedure Laterality Date  . Wisdom tooth extraction    . Dilation and curettage of uterus    . Cesarean section N/A 04/13/2014    Procedure: CESAREAN SECTION;  Surgeon: Tilda BurrowJohn Ferguson V, MD;  Location: [facility];  Service: Obstetrics;  Laterality: N/A;   Family History  Problem Relation Age of Onset  . Asthma Brother   . Diabetes Paternal Grandmother   . Kidney disease Paternal Grandmother    History  Substance Use Topics  . Smoking status: Current Some Day Smoker    Types: Cigarettes    Last Attempt to Quit: 09/26/2012  . Smokeless tobacco: Never Used  . Alcohol Use: Yes   OB History    Gravida Para Term Preterm AB TAB SAB Ectopic Multiple Living   4 2 1 1 1 1    2      Review of Systems   See History of Present Illness; otherwise all other systems are reviewed and negative  Allergies  Review of patient's allergies indicates no known allergies.  Home Medications   Prior to Admission medications   Medication Sig Start Date End Date Taking? Authorizing Provider  Multiple Vitamin (MULTIVITAMIN WITH MINERALS) TABS tablet Take 1 tablet by mouth daily.   Yes  Historical Provider, MD  fluconazole (DIFLUCAN) 150 MG tablet Take 1 tablet (150 mg total) by mouth once. Patient not taking: Reported on 11/09/2014 10/08/14   Marlis EdelsonWalidah N Karim, CNM   BP 146/76 mmHg  Pulse 103  Temp(Src) 98.4 F (36.9 C) (Oral)  Resp 19  SpO2 99%  LMP 10/11/2014  Breastfeeding? Unknown Physical Exam  Constitutional: She is oriented to person, place, and time. She appears well-developed and well-nourished.  HENT:  Head: Normocephalic and atraumatic.  Nose: Nose normal.  Mouth/Throat: Oropharynx is clear and moist.  Eyes: Conjunctivae and EOM are normal. Pupils are equal, round, and reactive to light.  Neck: Normal range of motion. Neck supple. No JVD present. No tracheal deviation present. No thyromegaly present.  Cardiovascular: Normal rate, regular rhythm, normal heart sounds and intact distal pulses.  Exam reveals no gallop and no friction rub.   No murmur heard. Pulmonary/Chest: Effort normal and breath sounds normal. No stridor. No respiratory distress. She has no wheezes. She has no rales. She exhibits no tenderness.  Abdominal: Soft. Bowel sounds are normal. She exhibits no distension and no mass. There is no tenderness. There is no rebound and no guarding.  Musculoskeletal: Normal range of motion. She exhibits no edema or tenderness.  Lymphadenopathy:    She has no cervical adenopathy.  Neurological: She is alert and oriented to person, place, and time. She displays normal reflexes. She exhibits normal muscle  tone. Coordination normal.  Skin: Skin is warm and dry. No rash noted. No erythema. No pallor.  Psychiatric: She has a normal mood and affect. Her behavior is normal. Judgment and thought content normal.  Nursing note and vitals reviewed.   ED Course  Procedures (including critical care time) Labs Review Labs Reviewed - No data to display  Imaging Review No results found.   EKG Interpretation   Date/Time:  Saturday Nov 09 2014 22:59:22  EDT Ventricular Rate:  102 PR Interval:  139 QRS Duration: 78 QT Interval:  353 QTC Calculation: 460 R Axis:   31 Text Interpretation:  Sinus tachycardia Probable left atrial enlargement  Borderline Q waves in lateral leads Borderline T wave abnormalities Since  last tracing rate slower Confirmed by Kamee Bobst  MD, Carmie Lanpher ([id number]) on 11/09/2014  11:07:48 PM      MDM   Final diagnoses:  Cocaine use    24 year old female with side effects from cocaine use.  She is beginning to have some anxiety, plan for a dose of oral Ativan and discharge.  Patient has been counseled to avoid further use of cocaine.  Marisa Severinlga Zerline Melchior, MD 11/09/14 [phone]

## 2014-11-09 NOTE — ED Notes (Signed)
Pt comes via EMS and reports trying cocaine for the first time. Pt was tachy and anxious with EMS. Pt given Versed 2.5mg  per EMS.

## 2014-11-09 NOTE — ED Notes (Signed)
Bed: WA02 Expected date:  Expected time:  Means of arrival:  Comments: EMS 1st time Cocaine use - ST

## 2014-11-09 NOTE — Discharge Instructions (Signed)
Stimulant Use Disorder-Cocaine °Cocaine is one of a group of powerful drugs called stimulants. Cocaine has medical uses for stopping nosebleeds and for pain control before minor nose or dental surgery. However, cocaine is misused because of the effects that it produces. These effects include:  °· A feeling of extreme pleasure. °· Alertness. °· High energy. °Common street names for cocaine include coke, crack, blow, snow, and nose candy. Cocaine is snorted, dissolved in water and injected, or smoked.  °Stimulants are addictive because they activate regions of the brain that produce both the pleasurable sensation of "reward" and psychological dependence. Together, these actions account for loss of control and the rapid development of drug dependence. This means you become ill without the drug (withdrawal) and need to keep using it to function.  °Stimulant use disorder is use of stimulants that disrupts your daily life. It disrupts relationships with family and friends and how you do your job. Cocaine increases your blood pressure and heart rate. It can cause a heart attack or stroke. Cocaine can also cause death from irregular heart rate or seizures. °SYMPTOMS °Symptoms of stimulant use disorder with cocaine include: °· Use of cocaine in larger amounts or over a longer period of time than intended. °· Unsuccessful attempts to cut down or control cocaine use. °· A lot of time spent obtaining, using, or recovering from the effects of cocaine. °· A strong desire or urge to use cocaine (craving). °· Continued use of cocaine in spite of major problems at work, school, or home because of use. °· Continued use of cocaine in spite of relationship problems because of use. °· Giving up or cutting down on important life activities because of cocaine use. °· Use of cocaine over and over in situations when it is physically hazardous, such as driving a car. °· Continued use of cocaine in spite of a physical problem that is likely  related to use. Physical problems can include: °¨ Malnutrition. °¨ Nosebleeds. °¨ Chest pain. °¨ High blood pressure. °¨ A hole that develops between the part of your nose that separates your nostrils (perforated nasal septum). °¨ Lung and kidney damage. °· Continued use of cocaine in spite of a mental problem that is likely related to use. Mental problems can include: °¨ Schizophrenia-like symptoms. °¨ Depression. °¨ Bipolar mood swings. °¨ Anxiety. °¨ Sleep problems. °· Need to use more and more cocaine to get the same effect, or lessened effect over time with use of the same amount of cocaine (tolerance). °· Having withdrawal symptoms when cocaine use is stopped, or using cocaine to reduce or avoid withdrawal symptoms. Withdrawal symptoms include: °¨ Depressed or irritable mood. °¨ Low energy or restlessness. °¨ Bad dreams. °¨ Poor or excessive sleep. °¨ Increased appetite. °DIAGNOSIS °Stimulant use disorder is diagnosed by your health care provider. You may be asked questions about your cocaine use and how it affects your life. A physical exam may be done. A drug screen may be ordered. You may be referred to a mental health professional. The diagnosis of stimulant use disorder requires at least two symptoms within 12 months. The type of stimulant use disorder depends on the number of signs and symptoms you have. The type may be: °· Mild. Two or three signs and symptoms. °· Moderate. Four or five signs and symptoms. °· Severe. Six or more signs and symptoms. °TREATMENT °Treatment for stimulant use disorder is usually provided by mental health professionals with training in substance use disorders. The following options are available: °·   Counseling or talk therapy. Talk therapy addresses the reasons you use cocaine and ways to keep you from using again. Goals of talk therapy include: °¨ Identifying and avoiding triggers for use. °¨ Handling cravings. °¨ Replacing use with healthy activities. °· Support groups.  Support groups provide emotional support, advice, and guidance. °· Medicine. Certain medicines may decrease cocaine cravings or withdrawal symptoms. °HOME CARE INSTRUCTIONS °· Take medicines only as directed by your health care provider. °· Identify the people and activities that trigger your cocaine use and avoid them. °· Keep all follow-up visits as directed by your health care provider. °SEEK MEDICAL CARE IF: °· Your symptoms get worse or you relapse. °· You are not able to take medicines as directed. °SEEK IMMEDIATE MEDICAL CARE IF: °· You have serious thoughts about hurting yourself or others. °· You have a seizure, chest pain, sudden weakness, or loss of speech or vision. °FOR MORE INFORMATION °· National Institute on Drug Abuse: www.drugabuse.gov °· Substance Abuse and Mental Health Services Administration: www.samhsa.gov °Document Released: 06/11/2000 Document Revised: 10/29/2013 Document Reviewed: 06/27/2013 °ExitCare® Patient Information ©2015 [employer]. This information is not intended to replace advice given to you by your health care provider. Make sure you discuss any questions you have with your health care provider. ° °

## 2014-11-18 ENCOUNTER — Institutional Professional Consult (permissible substitution): Payer: Medicaid Other | Admitting: Cardiology

## 2014-11-22 ENCOUNTER — Telehealth (HOSPITAL_COMMUNITY): Payer: Self-pay | Admitting: Family Medicine

## 2014-11-22 NOTE — Telephone Encounter (Signed)
5.27.16 pt had NewPt appt sched w/Dr Anne FuSkains on 5.23.16 per referral from your office and didn't show up. Removing from [facility] and a no show letter will be mailed out.[email or web address]   Erin Sonsebra Moore Patient Care Coordinator  [facility]

## 2014-11-29 ENCOUNTER — Encounter: Payer: Self-pay | Admitting: Cardiology

## 2015-01-26 ENCOUNTER — Encounter (HOSPITAL_COMMUNITY): Payer: Self-pay | Admitting: Emergency Medicine

## 2015-01-26 ENCOUNTER — Emergency Department (HOSPITAL_COMMUNITY)
Admission: EM | Admit: 2015-01-26 | Discharge: 2015-01-26 | Disposition: A | Discharge status: Home / Self Care | Payer: Medicaid Other | Attending: Emergency Medicine | Admitting: Emergency Medicine

## 2015-01-26 DIAGNOSIS — Z79899 Other long term (current) drug therapy: Secondary | ICD-10-CM | POA: Insufficient documentation

## 2015-01-26 DIAGNOSIS — J028 Acute pharyngitis due to other specified organisms: Secondary | ICD-10-CM

## 2015-01-26 DIAGNOSIS — B9789 Other viral agents as the cause of diseases classified elsewhere: Secondary | ICD-10-CM

## 2015-01-26 DIAGNOSIS — Z720 Tobacco use: Secondary | ICD-10-CM | POA: Diagnosis not present

## 2015-01-26 DIAGNOSIS — J029 Acute pharyngitis, unspecified: Secondary | ICD-10-CM | POA: Diagnosis not present

## 2015-01-26 LAB — RAPID STREP SCREEN (MED CTR MEBANE ONLY): Streptococcus, Group A Screen (Direct): NEGATIVE

## 2015-01-26 MED ORDER — ACETAMINOPHEN 500 MG PO TABS
ORAL_TABLET | ORAL | Status: AC
  Filled 2015-01-26: qty 2

## 2015-01-26 MED ORDER — ACETAMINOPHEN 500 MG PO TABS
1000.0000 mg | ORAL_TABLET | Freq: Once | ORAL | Status: AC
  Administered 2015-01-26: 1000 mg via ORAL

## 2015-01-26 NOTE — ED Notes (Signed)
Pt from home c/o sore throat since Friday. She has been using salt water without relief.  Throat is red.

## 2015-01-26 NOTE — Discharge Instructions (Signed)
Drink plenty of fluids. You can take ibuprofen 600 mg + acetaminophen 1000 mg 4 times a day for pain. You can also use throat lozenges, cepacol spray for pain, salt water gargles all for comfort. Recheck if you get a fever or your symptoms get worse.     Sore Throat A sore throat is pain, burning, irritation, or scratchiness of the throat. There is often pain or tenderness when swallowing or talking. A sore throat may be accompanied by other symptoms, such as coughing, sneezing, fever, and swollen neck glands. A sore throat is often the first sign of another sickness, such as a cold, flu, strep throat, or mononucleosis (commonly known as mono). Most sore throats go away without medical treatment. CAUSES  The most common causes of a sore throat include:  A viral infection, such as a cold, flu, or mono.  A bacterial infection, such as strep throat, tonsillitis, or whooping cough.  Seasonal allergies.  Dryness in the air.  Irritants, such as smoke or pollution.  Gastroesophageal reflux disease (GERD). HOME CARE INSTRUCTIONS   Only take over-the-counter medicines as directed by your caregiver.  Drink enough fluids to keep your urine clear or pale yellow.  Rest as needed.  Try using throat sprays, lozenges, or sucking on hard candy to ease any pain (if older than 4 years or as directed).  Sip warm liquids, such as broth, herbal tea, or warm water with honey to relieve pain temporarily. You may also eat or drink cold or frozen liquids such as frozen ice pops.  Gargle with salt water (mix 1 tsp salt with 8 oz of water).  Do not smoke and avoid secondhand smoke.  Put a cool-mist humidifier in your bedroom at night to moisten the air. You can also turn on a hot shower and sit in the bathroom with the door closed for 5-10 minutes. SEEK IMMEDIATE MEDICAL CARE IF:  You have difficulty breathing.  You are unable to swallow fluids, soft foods, or your saliva.  You have increased swelling  in the throat.  Your sore throat does not get better in 7 days.  You have nausea and vomiting.  You have a fever or persistent symptoms for more than 2-3 days.  You have a fever and your symptoms suddenly get worse. MAKE SURE YOU:   Understand these instructions.  Will watch your condition.  Will get help right away if you are not doing well or get worse. Document Released: 07/22/2004 Document Revised: 05/31/2012 Document Reviewed: 02/20/2012 [facility] Patient Information 2015 [place], [place]. This information is not intended to replace advice given to you by your health care provider. Make sure you discuss any questions you have with your health care provider.

## 2015-01-26 NOTE — ED Provider Notes (Signed)
CSN: [id number]     Arrival date & time 01/26/15  0127 History   First MD Initiated Contact with Patient 01/26/15 0131     Chief Complaint  Patient presents with  . Sore Throat     (Consider location/radiation/quality/duration/timing/severity/associated sxs/prior Treatment) HPI  Patient states she started getting a sore throat on July 29. She states is painful to swallow but she is still able to drink and eat. She states tonight her throat and tongue felt dry and she felt like she was having trouble swallowing. She was unable to sleep and came to the emergency department. She denies any fevers. She has had some nasal stuffiness and feels she has been mouth breathing at night. She denies any shortness of breath. She also admits she only smokes occasionally and when she smokes she gets a sore throat. She states she was smoking a few days ago. She denies being around anybody else who is ill. She does not have a history of strep pharyngitis.   PCP none  Past Medical History  Diagnosis Date  . Chlamydia infection complicating pregnancy in second trimester 04/10/2013   Past Surgical History  Procedure Laterality Date  . Wisdom tooth extraction    . Dilation and curettage of uterus    . Cesarean section N/A 04/13/2014    Procedure: CESAREAN SECTION;  Surgeon: Tilda Burrow, MD;  Location: [facility];  Service: Obstetrics;  Laterality: N/A;   Family History  Problem Relation Age of Onset  . Asthma Brother   . Diabetes Paternal Grandmother   . Kidney disease Paternal Grandmother    History  Substance Use Topics  . Smoking status: Current Some Day Smoker    Types: Cigarettes    Last Attempt to Quit: 09/26/2012  . Smokeless tobacco: Never Used  . Alcohol Use: Yes   Employed Smokes occassionally   OB History    Gravida Para Term Preterm AB TAB SAB Ectopic Multiple Living   4 2 1 1 1 1    2      Review of Systems  All other systems reviewed and are negative.     Allergies   Review of patient's allergies indicates no known allergies.  Home Medications   Prior to Admission medications   Medication Sig Start Date End Date Taking? Authorizing Provider  fluconazole (DIFLUCAN) 150 MG tablet Take 1 tablet (150 mg total) by mouth once. Patient not taking: Reported on 11/09/2014 10/08/14   Marlis Edelson, CNM  Multiple Vitamin (MULTIVITAMIN WITH MINERALS) TABS tablet Take 1 tablet by mouth daily.    Historical Provider, MD   BP 142/63 mmHg  Pulse 71  Temp(Src) 98.2 F (36.8 C) (Oral)  Resp 18  SpO2 98%  LMP 12/26/2014  Vital signs normal   Physical Exam  Constitutional: She is oriented to person, place, and time. She appears well-developed and well-nourished.  Non-toxic appearance. She does not appear ill. No distress.  HENT:  Head: Normocephalic and atraumatic.  Right Ear: External ear normal.  Left Ear: External ear normal.  Nose: Nose normal. No mucosal edema or rhinorrhea.  Mouth/Throat: Oropharynx is clear and moist and mucous membranes are normal. No dental abscesses or uvula swelling.  No redness of her soft palate, no swelling seen, uvula midline. No tonsilar enlargement. Voice is normal, no drooling.   Eyes: Conjunctivae and EOM are normal. Pupils are equal, round, and reactive to light.  Neck: Normal range of motion and full passive range of motion without pain. Neck supple.  Cardiovascular: Normal rate, regular rhythm and normal heart sounds.  Exam reveals no gallop and no friction rub.   No murmur heard. Pulmonary/Chest: Effort normal and breath sounds normal. No respiratory distress. She has no wheezes. She has no rhonchi. She has no rales. She exhibits no tenderness and no crepitus.  Abdominal: Soft. Normal appearance and bowel sounds are normal. She exhibits no distension. There is no tenderness. There is no rebound and no guarding.  Musculoskeletal: Normal range of motion. She exhibits no edema or tenderness.  Moves all extremities well.    Lymphadenopathy:    She has no cervical adenopathy.  Neurological: She is alert and oriented to person, place, and time. She has normal strength. No cranial nerve deficit.  Skin: Skin is warm, dry and intact. No rash noted. No erythema. No pallor.  Psychiatric: She has a normal mood and affect. Her speech is normal and behavior is normal. Her mood appears not anxious.  Nursing note and vitals reviewed.   ED Course  Procedures (including critical care time)  Medications  acetaminophen (TYLENOL) tablet 1,000 mg (not administered)    Patient was advised her strep was negative. Her throat also has no evidence of strep such as redness or swelling. We discussed this is most likely a viral illness.  Labs Review Results for orders placed or performed during the hospital encounter of 01/26/15  Rapid strep screen (not at [facility])  Result Value Ref Range   Streptococcus, Group A Screen (Direct) NEGATIVE NEGATIVE    Imaging Review No results found.   EKG Interpretation None      MDM   Final diagnoses:  Sore throat (viral)    Plan discharge  Devoria Albe, MD, Concha Pyo, MD 01/26/15 [phone]

## 2015-01-28 LAB — CULTURE, GROUP A STREP: Strep A Culture: NEGATIVE

## 2015-02-06 ENCOUNTER — Encounter (HOSPITAL_COMMUNITY): Payer: Self-pay

## 2015-02-06 ENCOUNTER — Emergency Department (HOSPITAL_COMMUNITY): Payer: Medicaid Other

## 2015-02-06 ENCOUNTER — Emergency Department (HOSPITAL_COMMUNITY)
Admission: EM | Admit: 2015-02-06 | Discharge: 2015-02-06 | Disposition: A | Discharge status: Home / Self Care | Payer: Medicaid Other | Attending: Emergency Medicine | Admitting: Emergency Medicine

## 2015-02-06 DIAGNOSIS — F439 Reaction to severe stress, unspecified: Secondary | ICD-10-CM

## 2015-02-06 DIAGNOSIS — R072 Precordial pain: Secondary | ICD-10-CM | POA: Insufficient documentation

## 2015-02-06 DIAGNOSIS — E669 Obesity, unspecified: Secondary | ICD-10-CM | POA: Insufficient documentation

## 2015-02-06 DIAGNOSIS — Z720 Tobacco use: Secondary | ICD-10-CM | POA: Insufficient documentation

## 2015-02-06 DIAGNOSIS — R002 Palpitations: Secondary | ICD-10-CM | POA: Diagnosis not present

## 2015-02-06 DIAGNOSIS — Z79899 Other long term (current) drug therapy: Secondary | ICD-10-CM | POA: Insufficient documentation

## 2015-02-06 DIAGNOSIS — R0789 Other chest pain: Secondary | ICD-10-CM

## 2015-02-06 DIAGNOSIS — R079 Chest pain, unspecified: Secondary | ICD-10-CM | POA: Diagnosis present

## 2015-02-06 LAB — BASIC METABOLIC PANEL
ANION GAP: 7 (ref 5–15)
BUN: 6 mg/dL (ref 6–20)
CO2: 25 mmol/L (ref 22–32)
CREATININE: 0.88 mg/dL (ref 0.44–1.00)
Calcium: 8.9 mg/dL (ref 8.9–10.3)
Chloride: 105 mmol/L (ref 101–111)
GFR calc non Af Amer: >60 mL/min (ref >60)
Glucose, Bld: 92 mg/dL (ref 65–99)
POTASSIUM: 3.8 mmol/L (ref 3.5–5.1)
Sodium: 137 mmol/L (ref 135–145)

## 2015-02-06 LAB — CBC
HCT: 38.4 % (ref 36.0–46.0)
Hemoglobin: 12.7 g/dL (ref 12.0–15.0)
MCH: 29.5 pg (ref 26.0–34.0)
MCHC: 33.1 g/dL (ref 30.0–36.0)
MCV: 89.1 fL (ref 78.0–100.0)
PLATELETS: 154 10*3/uL (ref 150–400)
RBC: 4.31 MIL/uL (ref 3.87–5.11)
RDW: 13.3 % (ref 11.5–15.5)
WBC: 13.3 10*3/uL — AB (ref 4.0–10.5)

## 2015-02-06 LAB — I-STAT TROPONIN, ED: Troponin i, poc: 0 ng/mL (ref 0.00–0.08)

## 2015-02-06 NOTE — ED Provider Notes (Signed)
CSN: [id number]     Arrival date & time 02/06/15  1608 History   First MD Initiated Contact with Patient 02/06/15 1738     Chief Complaint  Patient presents with  . Chest Pain     (Consider location/radiation/quality/duration/timing/severity/associated sxs/prior Treatment) HPI Comments: 24 year old female presenting with midsternal chest pain beginning an hour and a half prior to arrival while laying in bed. Pain described as a dull pressure, nonradiating that is slowly starting to subside without any intervention. When the pain began, she felt slightly weak and sweaty. Denies shortness of breath, nausea, vomiting, extremity numbness or weakness. Reports being under an immense amount of stress currently and has occasional episodes of her heart racing. History of cocaine use and has not used cocaine in a week. Reports having chest pain multiple times in the past, and she has an appointment scheduled with cardiology next month. States she is seeing cardiology as she was referred from urgent care in the past "for nothing serious". Occasional smoker. No family history of early heart disease.  Patient is a 24 y.o. female presenting with chest pain. The history is provided by the patient.  Chest Pain Associated symptoms: palpitations     Past Medical History  Diagnosis Date  . Chlamydia infection complicating pregnancy in second trimester 04/10/2013   Past Surgical History  Procedure Laterality Date  . Wisdom tooth extraction    . Dilation and curettage of uterus    . Cesarean section N/A 04/13/2014    Procedure: CESAREAN SECTION;  Surgeon: Tilda Burrow, MD;  Location: [facility];  Service: Obstetrics;  Laterality: N/A;   Family History  Problem Relation Age of Onset  . Asthma Brother   . Diabetes Paternal Grandmother   . Kidney disease Paternal Grandmother    Social History  Substance Use Topics  . Smoking status: Current Some Day Smoker    Types: Cigarettes    Last Attempt to Quit:  09/26/2012  . Smokeless tobacco: Never Used  . Alcohol Use: Yes     Comment: weekends only   OB History    Gravida Para Term Preterm AB TAB SAB Ectopic Multiple Living   4 2 1 1 1 1    2      Review of Systems  Cardiovascular: Positive for chest pain and palpitations.  Psychiatric/Behavioral:       + Stress.  All other systems reviewed and are negative.     Allergies  Review of patient's allergies indicates no known allergies.  Home Medications   Prior to Admission medications   Medication Sig Start Date End Date Taking? Authorizing Provider  ferrous fumarate (HEMOCYTE - 106 MG FE) 325 (106 FE) MG TABS tablet Take 1 tablet by mouth daily.   Yes Historical Provider, MD  Multiple Vitamin (MULTIVITAMIN WITH MINERALS) TABS tablet Take 1 tablet by mouth daily.   Yes Historical Provider, MD  fluconazole (DIFLUCAN) 150 MG tablet Take 1 tablet (150 mg total) by mouth once. Patient not taking: Reported on 11/09/2014 10/08/14   Marlis Edelson, CNM   BP 133/67 mmHg  Pulse 86  Temp(Src) 97.8 F (36.6 C) (Oral)  Resp 20  SpO2 99%  LMP 01/05/2015 Physical Exam  Constitutional: She is oriented to person, place, and time. She appears well-developed and well-nourished. No distress.  Obese.  HENT:  Head: Normocephalic and atraumatic.  Mouth/Throat: Oropharynx is clear and moist.  Eyes: Conjunctivae and EOM are normal. Pupils are equal, round, and reactive to light.  Neck: Normal range  of motion. Neck supple. No JVD present.  Cardiovascular: Normal rate, regular rhythm, normal heart sounds and intact distal pulses.   No extremity edema.  Pulmonary/Chest: Effort normal and breath sounds normal. No respiratory distress. She exhibits tenderness (mid-sternal).  Abdominal: Soft. Bowel sounds are normal. There is no tenderness.  Musculoskeletal: Normal range of motion. She exhibits no edema.  Neurological: She is alert and oriented to person, place, and time. She has normal strength. No  sensory deficit.  Speech fluent, goal oriented. Moves extremities without ataxia. Equal grip strength bilateral.  Skin: Skin is warm and dry. She is not diaphoretic.  Psychiatric: She has a normal mood and affect. Her behavior is normal.  Nursing note and vitals reviewed.   ED Course  Procedures (including critical care time) Labs Review Labs Reviewed  CBC - Abnormal; Notable for the following:    WBC 13.3 (*)    All other components within normal limits  BASIC METABOLIC PANEL  Rosezena Sensor, ED    Imaging Review Dg Chest 2 View  02/06/2015   CLINICAL DATA:  Central chest pain intermittently for 2-3 months. Occasional smoker.  EXAM: CHEST  2 VIEW  COMPARISON:  09/28/2014  FINDINGS: The cardiomediastinal silhouette is within normal limits. The lungs are well inflated and clear. There is no evidence of pleural effusion or pneumothorax. Moderate S-shaped thoracolumbar scoliosis is again seen.  IMPRESSION: No active cardiopulmonary disease.   Electronically Signed   By: Sebastian Ache   On: 02/06/2015 16:58   I, Celene Skeen, personally reviewed and evaluated these images and lab results as part of my medical decision-making.   EKG Interpretation None     <ECG>   Date: 02/06/2015  Rate: 81  Rhythm: normal sinus rhythm  QRS Axis: normal  Intervals: normal  ST/T Wave abnormalities: normal  Conduction Disutrbances: none  Narrative Interpretation: Sinus rhythm, probable left atrial enlargement  Old EKG Reviewed: No significant changes noted    MDM   Final diagnoses:  Midsternal chest pain  Stress   Nontoxic appearing, NAD. AF VSS. Patient's chest pain most likely coming from stress. Has presented for the same negative workups. Workup today negative. Doubt cardiac. Heart score 1. Doubt PE. Advised the patient to keep her appointment for follow-up with cardiology and follow-up with her PCP to discuss stress management/anxiety. Stable for discharge. Return precautions given.  Patient states understanding of treatment care plan and is agreeable.  Kathrynn Speed, PA-C 02/06/15 1830  Marily Memos, MD 02/07/15 [phone]

## 2015-02-06 NOTE — Discharge Instructions (Signed)
Follow up with cardiology as scheduled. °

## 2015-02-06 NOTE — ED Notes (Signed)
PA at bedside.

## 2015-02-06 NOTE — ED Notes (Signed)
Patient c/o mid chest pain that started an hour ago. Patient states she feels as if her heart is racing.( HR-87 in triage). Patient also stated that she had diaphoresis and slight weakness when the chest pain began.

## 2015-02-06 NOTE — Progress Notes (Signed)
Medicaid pcp is [facility] [address] [place], [place] [address] [phone]

## 2015-03-11 ENCOUNTER — Encounter: Payer: Self-pay | Admitting: Cardiology

## 2015-03-11 ENCOUNTER — Ambulatory Visit (INDEPENDENT_AMBULATORY_CARE_PROVIDER_SITE_OTHER): Payer: Medicaid Other | Admitting: Cardiology

## 2015-03-11 VITALS — BP 120/70 | HR 93 | Ht 67.0 in | Wt 274.5 lb

## 2015-03-11 DIAGNOSIS — F1410 Cocaine abuse, uncomplicated: Secondary | ICD-10-CM

## 2015-03-11 DIAGNOSIS — E669 Obesity, unspecified: Secondary | ICD-10-CM | POA: Diagnosis not present

## 2015-03-11 DIAGNOSIS — Z3490 Encounter for supervision of normal pregnancy, unspecified, unspecified trimester: Secondary | ICD-10-CM

## 2015-03-11 DIAGNOSIS — R079 Chest pain, unspecified: Secondary | ICD-10-CM

## 2015-03-11 NOTE — Progress Notes (Signed)
Cardiology Office Note   Date:  03/11/2015   ID:  Michele Hayden, DOB 08/18/1990, MRN [id number]  PCP:  Dorrene German, MD  Cardiologist:   Donato Schultz, MD   Chief Complaint  Patient presents with  . New Patient (Initial Visit)  . Heart rate concern      History of Present Illness: Michele Hayden is a 24 y.o. female who presents for evaluation of chest pain. She was seen in the emergency department on 02/06/15 with midsternal chest discomfort lasting approximately an hour and a half while lying in bed. Per emergency room note he was described as a dull pressure, nonradiating that slowly subsided. She felt weak, sweaty. No shortness of breath, no vomiting. She has been under immense stress. She is noted heart racing as well. Prior cocaine use. She admitted to the emergency.that she had not used in over a week. Smokes occasionally.  Constant pressure, feels SOB. If lay down slightly elevated it will improve. If moves alto tight. Can change with food. If eats poorly, soda, pain comes up and sits here "points to her chest". When burp it releases pain.  Mother had heart issues with heart rate. Brother had heart murmur in [employer].   Mother of son (1.5 years).  Currently Pregnant. Tried TUMS.    Past Medical History  Diagnosis Date  . Chlamydia infection complicating pregnancy in second trimester 04/10/2013    Past Surgical History  Procedure Laterality Date  . Wisdom tooth extraction    . Dilation and curettage of uterus    . Cesarean section N/A 04/13/2014    Procedure: CESAREAN SECTION;  Surgeon: Tilda Burrow, MD;  Location: [facility];  Service: Obstetrics;  Laterality: N/A;     Current Outpatient Prescriptions  Medication Sig Dispense Refill  . ferrous fumarate (HEMOCYTE - 106 MG FE) 325 (106 FE) MG TABS tablet Take 1 tablet by mouth daily.     No current facility-administered medications for this visit.    Allergies:   Review of patient's allergies indicates no  known allergies.    Social History:  The patient  reports that she has been smoking Cigarettes.  She has quit using smokeless tobacco. She reports that she drinks alcohol. 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   Family History:  The patient's family history includes Asthma in her brother; Diabetes in her brother, maternal aunt, and paternal grandmother; Heart attack in her paternal grandmother; Heart disease in her paternal grandmother; Kidney disease in her paternal grandmother.    ROS:  Please see the history of present illness.   Otherwise, review of systems are positive forChest pain, shortness of breath, chest pressure, constipation, snoring, recently quit smoking.  All other systems are reviewed and negative.    PHYSICAL EXAM: VS:  BP 120/70 mmHg  Pulse 93  Ht 5\' 7"  (1.702 m)  Wt 274 lb 8 oz (124.512 kg)  BMI 42.98 kg/m2  LMP 09/10/2014 (Approximate) , BMI Body mass index is 42.98 kg/(m^2). GEN: Well nourished, well developed, in no acute distress HEENT: normal Neck: no JVD, carotid bruits, or masses Cardiac: RRR; no murmurs, rubs, or gallops,no edema  Respiratory:  clear to auscultation bilaterally, normal work of breathing GI: soft, nontender, nondistended, + BS, overweight MS: no deformity or atrophy Skin: warm and dry, no rash Neuro:  Strength and sensation are intact Psych: euthymic mood, full affect   EKG:  EKG is not ordered today. The ekg from 02/06/15 showed sinus  rhythm with nonspecific ST changes.   Recent Labs: 02/06/2015: BUN 6; Creatinine, Ser 0.88; Hemoglobin 12.7; Platelets 154; Potassium 3.8; Sodium 137    Lipid Panel No results found for: CHOL, TRIG, HDL, CHOLHDL, VLDL, LDLCALC, LDLDIRECT    Wt Readings from Last 3 Encounters:  03/11/15 274 lb 8 oz (124.512 kg)  10/08/14 267 lb (121.11 kg)  10/03/14 268 lb (121.564 kg)      Other studies Reviewed: Additional studies/ records that were reviewed today incER visit, lab  work, EKG office note reviewed  view of the above records demonstrates: as above   ASSESSMENT AND PLAN:  1. atypical chest pain-the description of constant pain that seems to be changed or altered with different food types by burping points towards a gastrointestinal source such as heartburn/ GERD. She can consider Pepcid, Zantac, Prilosec. I asked her to discuss this with her primary. Pregnancy can also cause worsening heartburn. She does have nonspecific T-wave changes on EKG however her clinicals. Does not point towards anginal symptoms. I will check an echocardiogram to ensure proper structure and function of her heart to make sure that she does not have any paracardial effusion. Chest x-ray appeared normal. Personally viewed.  2. Prior cocaine use-cessation. This could lead to sudden death. she states that she is use this on recreational use. Marijuana as well    3. Obesity-encourage weight loss.  4. Pregnancy continue to work with OB/GYN/primary. Avoid teratogenic agents.   Current medicines are reviewed at length with the patient today.  The patient does not have concerns regarding medicines.  The following changes have been made:  no change  Labs/ tests ordered today include: ECHO  No orders of the defined types were placed in this encounter.     Disposition:     Mathews Robinsons, MD  03/11/2015 3:30 PM    [facility] [address] [place], [place], [place]  [address] Phone: [phone]; Fax: [phone]

## 2015-03-11 NOTE — Patient Instructions (Signed)
Medication Instructions:  The current medical regimen is effective;  continue present plan and medications.  Testing/Procedures: Your physician has requested that you have an echocardiogram. Echocardiography is a painless test that uses sound waves to create images of your heart. It provides your doctor with information about the size and shape of your heart and how well your heart's chambers and valves are working. This procedure takes approximately one hour. There are no restrictions for this procedure.  Follow-Up: Follow up as needed after testing.  Thank you for choosing [facility]!!     

## 2015-03-20 ENCOUNTER — Other Ambulatory Visit (HOSPITAL_COMMUNITY): Payer: Medicaid Other

## 2015-03-24 ENCOUNTER — Ambulatory Visit (HOSPITAL_COMMUNITY): Payer: Medicaid Other | Attending: Cardiology

## 2015-03-24 ENCOUNTER — Other Ambulatory Visit: Payer: Self-pay

## 2015-03-24 DIAGNOSIS — Z6841 Body Mass Index (BMI) 40.0 and over, adult: Secondary | ICD-10-CM | POA: Insufficient documentation

## 2015-03-24 DIAGNOSIS — I517 Cardiomegaly: Secondary | ICD-10-CM | POA: Diagnosis not present

## 2015-03-24 DIAGNOSIS — R079 Chest pain, unspecified: Secondary | ICD-10-CM | POA: Diagnosis not present

## 2015-03-26 ENCOUNTER — Encounter: Payer: Self-pay | Admitting: *Deleted

## 2015-03-27 ENCOUNTER — Inpatient Hospital Stay (HOSPITAL_COMMUNITY)
Admission: AD | Admit: 2015-03-27 | Discharge: 2015-03-27 | Disposition: A | Discharge status: Home / Self Care | Payer: Medicaid Other | Source: Ambulatory Visit | Attending: Obstetrics & Gynecology | Admitting: Obstetrics & Gynecology

## 2015-03-27 ENCOUNTER — Inpatient Hospital Stay (HOSPITAL_COMMUNITY): Payer: Medicaid Other

## 2015-03-27 ENCOUNTER — Encounter (HOSPITAL_COMMUNITY): Payer: Self-pay | Admitting: *Deleted

## 2015-03-27 DIAGNOSIS — O26899 Other specified pregnancy related conditions, unspecified trimester: Secondary | ICD-10-CM

## 2015-03-27 DIAGNOSIS — Z3A12 12 weeks gestation of pregnancy: Secondary | ICD-10-CM | POA: Insufficient documentation

## 2015-03-27 DIAGNOSIS — R109 Unspecified abdominal pain: Secondary | ICD-10-CM

## 2015-03-27 DIAGNOSIS — O26891 Other specified pregnancy related conditions, first trimester: Secondary | ICD-10-CM | POA: Diagnosis not present

## 2015-03-27 DIAGNOSIS — Z3687 Encounter for antenatal screening for uncertain dates: Secondary | ICD-10-CM

## 2015-03-27 DIAGNOSIS — Z36 Encounter for antenatal screening of mother: Secondary | ICD-10-CM | POA: Diagnosis not present

## 2015-03-27 DIAGNOSIS — R1013 Epigastric pain: Secondary | ICD-10-CM | POA: Diagnosis present

## 2015-03-27 DIAGNOSIS — Z87891 Personal history of nicotine dependence: Secondary | ICD-10-CM | POA: Diagnosis not present

## 2015-03-27 LAB — URINALYSIS, ROUTINE W REFLEX MICROSCOPIC
Bilirubin Urine: NEGATIVE
Glucose, UA: NEGATIVE mg/dL
HGB URINE DIPSTICK: NEGATIVE
Ketones, ur: NEGATIVE mg/dL
Leukocytes, UA: NEGATIVE
Nitrite: NEGATIVE
PROTEIN: NEGATIVE mg/dL
Specific Gravity, Urine: >1.03 — ABNORMAL HIGH (ref 1.005–1.030)
Urobilinogen, UA: 1 mg/dL (ref 0.0–1.0)
pH: 6 (ref 5.0–8.0)

## 2015-03-27 LAB — CBC
HCT: 32.3 % — ABNORMAL LOW (ref 36.0–46.0)
HEMOGLOBIN: 11 g/dL — AB (ref 12.0–15.0)
MCH: 30.1 pg (ref 26.0–34.0)
MCHC: 34.1 g/dL (ref 30.0–36.0)
MCV: 88.5 fL (ref 78.0–100.0)
PLATELETS: 126 10*3/uL — AB (ref 150–400)
RBC: 3.65 MIL/uL — AB (ref 3.87–5.11)
RDW: 13.3 % (ref 11.5–15.5)
WBC: 7.7 10*3/uL (ref 4.0–10.5)

## 2015-03-27 LAB — HCG, QUANTITATIVE, PREGNANCY: hCG, Beta Chain, Quant, S: 129772 m[IU]/mL — ABNORMAL HIGH (ref <5)

## 2015-03-27 LAB — WET PREP, GENITAL
CLUE CELLS WET PREP: NONE SEEN
TRICH WET PREP: NONE SEEN
Yeast Wet Prep HPF POC: NONE SEEN

## 2015-03-27 LAB — POCT PREGNANCY, URINE: Preg Test, Ur: POSITIVE — AB

## 2015-03-27 NOTE — MAU Note (Signed)
Pt reports generalized abd cramping and she had a positive pregnancy test at home 2 weeks ago.

## 2015-03-27 NOTE — MAU Provider Note (Signed)
History     CSN: [id number]  Arrival date and time: 03/27/15 1900   First Provider Initiated Contact with Patient 03/27/15 2011      Chief Complaint  Patient presents with  . Abdominal Pain   HPI Comments: LMP 01/20/15   Abdominal Pain This is a new problem. The current episode started yesterday. The onset quality is gradual. The problem occurs intermittently. The problem has been unchanged. The pain is located in the suprapubic region. The pain is at a severity of 3/10. The quality of the pain is cramping and sharp. The abdominal pain radiates to the epigastric region and back. Associated symptoms include constipation (last BM was 03/26/15. It was small and formed. ) and nausea. Pertinent negatives include no diarrhea, dysuria, fever, frequency or vomiting. The pain is aggravated by movement. The pain is relieved by being still. She has tried nothing for the symptoms.    Past Medical History  Diagnosis Date  . Chlamydia infection complicating pregnancy in second trimester 04/10/2013    Past Surgical History  Procedure Laterality Date  . Wisdom tooth extraction    . Dilation and curettage of uterus    . Cesarean section N/A 04/13/2014    Procedure: CESAREAN SECTION;  Surgeon: Tilda Burrow, MD;  Location: [facility];  Service: Obstetrics;  Laterality: N/A;    Family History  Problem Relation Age of Onset  . Asthma Brother   . Diabetes Brother   . Diabetes Paternal Grandmother   . Kidney disease Paternal Grandmother   . Heart disease Paternal Grandmother   . Heart attack Paternal Grandmother   . Diabetes Maternal Aunt     Social History  Substance Use Topics  . Smoking status: Former Smoker    Types: Cigarettes    Quit date: 10/27/2014  . Smokeless tobacco: Former Neurosurgeon  . Alcohol Use: Yes     Comment: last use 2 months ago as of 02/2015    Allergies: No Known Allergies  Prescriptions prior to admission  Medication Sig Dispense Refill Last Dose  . ferrous fumarate  (HEMOCYTE - 106 MG FE) 325 (106 FE) MG TABS tablet Take 1 tablet by mouth daily.   Past Month at Unknown time    Review of Systems  Constitutional: Negative for fever.  Gastrointestinal: Positive for nausea, abdominal pain and constipation (last BM was 03/26/15. It was small and formed. ). Negative for vomiting and diarrhea.  Genitourinary: Negative for dysuria, urgency and frequency.   Physical Exam   Blood pressure 123/72, pulse 79, temperature 98.9 F (37.2 C), temperature source Oral, resp. rate 18, height  (1.702 m), weight 123.378 kg (272 lb), last menstrual period 01/20/2015, SpO2 99 %, unknown if currently breastfeeding.  Physical Exam  Nursing note and vitals reviewed. Constitutional: She is oriented to person, place, and time. She appears well-developed and well-nourished. No distress.  HENT:  Head: Normocephalic.  Eyes: EOM are normal.  Cardiovascular: Normal rate.   Respiratory: Effort normal.  GI: Soft. There is no tenderness.  Neurological: She is alert and oriented to person, place, and time.  Skin: Skin is warm and dry.  Psychiatric: She has a normal mood and affect.   Results for orders placed or performed during the hospital encounter of 03/27/15 (from the past 24 hour(s))  Urinalysis, Routine w reflex microscopic (not at [facility])     Status: Abnormal   Collection Time: 03/27/15  7:02 PM  Result Value Ref Range   Color, Urine YELLOW YELLOW   APPearance  CLEAR CLEAR   Specific Gravity, Urine >1.030 (H) 1.005 - 1.030   pH 6.0 5.0 - 8.0   Glucose, UA NEGATIVE NEGATIVE mg/dL   Hgb urine dipstick NEGATIVE NEGATIVE   Bilirubin Urine NEGATIVE NEGATIVE   Ketones, ur NEGATIVE NEGATIVE mg/dL   Protein, ur NEGATIVE NEGATIVE mg/dL   Urobilinogen, UA 1.0 0.0 - 1.0 mg/dL   Nitrite NEGATIVE NEGATIVE   Leukocytes, UA NEGATIVE NEGATIVE  Pregnancy, urine POC     Status: Abnormal   Collection Time: 03/27/15  7:24 PM  Result Value Ref Range   Preg Test, Ur POSITIVE (A)  NEGATIVE  CBC     Status: Abnormal   Collection Time: 03/27/15  8:13 PM  Result Value Ref Range   WBC 7.7 4.0 - 10.5 K/uL   RBC 3.65 (L) 3.87 - 5.11 MIL/uL   Hemoglobin 11.0 (L) 12.0 - 15.0 g/dL   HCT 16.1 (L) 09.6 - 04.5 %   MCV 88.5 78.0 - 100.0 fL   MCH 30.1 26.0 - 34.0 pg   MCHC 34.1 30.0 - 36.0 g/dL   RDW 40.9 81.1 - 91.4 %   Platelets 126 (L) 150 - 400 K/uL  hCG, quantitative, pregnancy     Status: Abnormal   Collection Time: 03/27/15  8:13 PM  Result Value Ref Range   hCG, Beta Chain, Quant, S [id number] (H) <5 mIU/mL   [place]: 14 weeks 3 days by [place] Amniotic fluid: subjectively normal Cervix: appears normal Placenta: no previa MAU Course  Procedures  MDM   Assessment and Plan   1. Unsure of LMP (last menstrual period) as reason for ultrasound scan   2. Abdominal pain in pregnancy, antepartum    DC home Comfort measures reviewed  2nd Trimester precautions  RX: none  Return to MAU as needed   Follow-up Information    Schedule an appointment as soon as possible for a visit with [facility] [employer] [facility].   Specialty:  Obstetrics and Gynecology   Contact information:   [address] [place] [address] [phone]      Tawnya Crook 03/27/2015, 8:16 PM

## 2015-03-27 NOTE — MAU Note (Signed)
Pt reports positive pregnancy test in august after missed period, states "weighing her options with this pregnancy" and therefore has not set up prenatal care yet. "I'm leaning towards going through with this pregnancy".

## 2015-03-27 NOTE — Discharge Instructions (Signed)
Second Trimester of Pregnancy The second trimester is from week 13 through week 28, months 4 through 6. The second trimester is often a time when you feel your best. Your body has also adjusted to being pregnant, and you begin to feel better physically. Usually, morning sickness has lessened or quit completely, you may have more energy, and you may have an increase in appetite. The second trimester is also a time when the fetus is growing rapidly. At the end of the sixth month, the fetus is about 9 inches long and weighs about 1 pounds. You will likely begin to feel the baby move (quickening) between 18 and 20 weeks of the pregnancy. BODY CHANGES Your body goes through many changes during pregnancy. The changes vary from woman to woman.   Your weight will continue to increase. You will notice your lower abdomen bulging out.  You may begin to get stretch marks on your hips, abdomen, and breasts.  You may develop headaches that can be relieved by medicines approved by your health care provider.  You may urinate more often because the fetus is pressing on your bladder.  You may develop or continue to have heartburn as a result of your pregnancy.  You may develop constipation because certain hormones are causing the muscles that push waste through your intestines to slow down.  You may develop hemorrhoids or swollen, bulging veins (varicose veins).  You may have back pain because of the weight gain and pregnancy hormones relaxing your joints between the bones in your pelvis and as a result of a shift in weight and the muscles that support your balance.  Your breasts will continue to grow and be tender.  Your gums may bleed and may be sensitive to brushing and flossing.  Dark spots or blotches (chloasma, mask of pregnancy) may develop on your face. This will likely fade after the baby is born.  A dark line from your belly button to the pubic area (linea nigra) may appear. This will likely fade  after the baby is born.  You may have changes in your hair. These can include thickening of your hair, rapid growth, and changes in texture. Some women also have hair loss during or after pregnancy, or hair that feels dry or thin. Your hair will most likely return to normal after your baby is born. WHAT TO EXPECT AT YOUR PRENATAL VISITS During a routine prenatal visit:  You will be weighed to make sure you and the fetus are growing normally.  Your blood pressure will be taken.  Your abdomen will be measured to track your baby's growth.  The fetal heartbeat will be listened to.  Any test results from the previous visit will be discussed. Your health care provider may ask you:  How you are feeling.  If you are feeling the baby move.  If you have had any abnormal symptoms, such as leaking fluid, bleeding, severe headaches, or abdominal cramping.  If you have any questions. Other tests that may be performed during your second trimester include:  Blood tests that check for:  Low iron levels (anemia).  Gestational diabetes (between 24 and 28 weeks).  Rh antibodies.  Urine tests to check for infections, diabetes, or protein in the urine.  An ultrasound to confirm the proper growth and development of the baby.  An amniocentesis to check for possible genetic problems.  Fetal screens for spina bifida and Down syndrome. HOME CARE INSTRUCTIONS   Avoid all smoking, herbs, alcohol, and unprescribed   drugs. These chemicals affect the formation and growth of the baby.  Follow your health care provider's instructions regarding medicine use. There are medicines that are either safe or unsafe to take during pregnancy.  Exercise only as directed by your health care provider. Experiencing uterine cramps is a good sign to stop exercising.  Continue to eat regular, healthy meals.  Wear a good support bra for breast tenderness.  Do not use hot tubs, steam rooms, or saunas.  Wear your  seat belt at all times when driving.  Avoid raw meat, uncooked cheese, cat litter boxes, and soil used by cats. These carry germs that can cause birth defects in the baby.  Take your prenatal vitamins.  Try taking a stool softener (if your health care provider approves) if you develop constipation. Eat more high-fiber foods, such as fresh vegetables or fruit and whole grains. Drink plenty of fluids to keep your urine clear or pale yellow.  Take warm sitz baths to soothe any pain or discomfort caused by hemorrhoids. Use hemorrhoid cream if your health care provider approves.  If you develop varicose veins, wear support hose. Elevate your feet for 15 minutes, 3-4 times a day. Limit salt in your diet.  Avoid heavy lifting, wear low heel shoes, and practice good posture.  Rest with your legs elevated if you have leg cramps or low back pain.  Visit your dentist if you have not gone yet during your pregnancy. Use a soft toothbrush to brush your teeth and be gentle when you floss.  A sexual relationship may be continued unless your health care provider directs you otherwise.  Continue to go to all your prenatal visits as directed by your health care provider. SEEK MEDICAL CARE IF:   You have dizziness.  You have mild pelvic cramps, pelvic pressure, or nagging pain in the abdominal area.  You have persistent nausea, vomiting, or diarrhea.  You have a bad smelling vaginal discharge.  You have pain with urination. SEEK IMMEDIATE MEDICAL CARE IF:   You have a fever.  You are leaking fluid from your vagina.  You have spotting or bleeding from your vagina.  You have severe abdominal cramping or pain.  You have rapid weight gain or loss.  You have shortness of breath with chest pain.  You notice sudden or extreme swelling of your face, hands, ankles, feet, or legs.  You have not felt your baby move in over an hour.  You have severe headaches that do not go away with  medicine.  You have vision changes. Document Released: 06/08/2001 Document Revised: 06/19/2013 Document Reviewed: 08/15/2012 Exit[employer] Patient Information 2015 [employer]. This information is not intended to replace advice given to you by your health care provider. Make sure you discuss any questions you have with your health care provider.  

## 2015-03-28 LAB — GC/CHLAMYDIA PROBE AMP (~~LOC~~) NOT AT ARMC
Chlamydia: NEGATIVE
NEISSERIA GONORRHEA: NEGATIVE

## 2015-03-28 LAB — HIV ANTIBODY (ROUTINE TESTING W REFLEX): HIV Screen 4th Generation wRfx: NONREACTIVE

## 2015-06-29 NOTE — L&D Delivery Note (Signed)
25 y.o. [id number] at [id number] delivered a viable female infant in cephalic, OA position. No nuchal cord. Anterior shoulder delivered with ease. 60 sec delayed cord clamping. Cord clamped x2 and cut. Placenta delivered spontaneously intact, with 3VC. Fundus firm on exam with massage and pitocin. Good hemostasis noted.  Laceration: None Suture: N/A  Good hemostasis noted. EBL: 100cc  Mom and baby recovering in LDR.    Apgars: 8, 9 Weight: pending Skin to skin, couplet care.   Freddrick MarchYashika Amin, MD PGY-1 06/07/2016, 2:39 AM

## 2015-08-05 ENCOUNTER — Emergency Department (HOSPITAL_COMMUNITY)
Admission: EM | Admit: 2015-08-05 | Discharge: 2015-08-06 | Disposition: A | Discharge status: Home / Self Care | Payer: Self-pay | Attending: Emergency Medicine | Admitting: Emergency Medicine

## 2015-08-05 DIAGNOSIS — Z79899 Other long term (current) drug therapy: Secondary | ICD-10-CM | POA: Insufficient documentation

## 2015-08-05 DIAGNOSIS — F419 Anxiety disorder, unspecified: Secondary | ICD-10-CM | POA: Insufficient documentation

## 2015-08-05 DIAGNOSIS — F17210 Nicotine dependence, cigarettes, uncomplicated: Secondary | ICD-10-CM | POA: Insufficient documentation

## 2015-08-05 DIAGNOSIS — K219 Gastro-esophageal reflux disease without esophagitis: Secondary | ICD-10-CM | POA: Insufficient documentation

## 2015-08-06 ENCOUNTER — Encounter (HOSPITAL_COMMUNITY): Payer: Self-pay | Admitting: *Deleted

## 2015-08-06 ENCOUNTER — Emergency Department (HOSPITAL_COMMUNITY): Payer: Self-pay

## 2015-08-06 LAB — I-STAT TROPONIN, ED: Troponin i, poc: 0 ng/mL (ref 0.00–0.08)

## 2015-08-06 LAB — BASIC METABOLIC PANEL
Anion gap: 10 (ref 5–15)
BUN: 19 mg/dL (ref 6–20)
CALCIUM: 9.3 mg/dL (ref 8.9–10.3)
CHLORIDE: 106 mmol/L (ref 101–111)
CO2: 25 mmol/L (ref 22–32)
CREATININE: 0.96 mg/dL (ref 0.44–1.00)
GFR calc Af Amer: >60 mL/min (ref >60)
GFR calc non Af Amer: >60 mL/min (ref >60)
GLUCOSE: 93 mg/dL (ref 65–99)
Potassium: 4.3 mmol/L (ref 3.5–5.1)
Sodium: 141 mmol/L (ref 135–145)

## 2015-08-06 LAB — CBC
HCT: 35.2 % — ABNORMAL LOW (ref 36.0–46.0)
HEMOGLOBIN: 11.9 g/dL — AB (ref 12.0–15.0)
MCH: 30.7 pg (ref 26.0–34.0)
MCHC: 33.8 g/dL (ref 30.0–36.0)
MCV: 91 fL (ref 78.0–100.0)
Platelets: 169 10*3/uL (ref 150–400)
RBC: 3.87 MIL/uL (ref 3.87–5.11)
RDW: 13.3 % (ref 11.5–15.5)
WBC: 8.5 10*3/uL (ref 4.0–10.5)

## 2015-08-06 MED ORDER — OMEPRAZOLE 20 MG PO CPDR: 20.0000 mg | DELAYED_RELEASE_CAPSULE | Freq: Every day | ORAL | Status: DC

## 2015-08-06 MED ORDER — GI COCKTAIL ~~LOC~~
ORAL | Status: AC
  Filled 2015-08-06: qty 30

## 2015-08-06 MED ORDER — LORAZEPAM 1 MG PO TABS: 1.0000 mg | ORAL_TABLET | Freq: Three times a day (TID) | ORAL | Status: DC | PRN

## 2015-08-06 MED ORDER — PANTOPRAZOLE SODIUM 40 MG PO TBEC
DELAYED_RELEASE_TABLET | ORAL | Status: AC
  Filled 2015-08-06: qty 1

## 2015-08-06 MED ORDER — LORAZEPAM 1 MG PO TABS
ORAL_TABLET | ORAL | Status: AC
  Filled 2015-08-06: qty 1

## 2015-08-06 NOTE — ED Notes (Signed)
Pt states that she began having chest pain around 4pm today; pt states that the pain is intermittent and comes and goes; pt states that the pain is a tightness; pt c/o shortness of breath at times; denies N/v; pt states that she has had chest pain in the past and was seen by a cardiologist but the workup was negative

## 2015-08-06 NOTE — ED Provider Notes (Signed)
CSN: [id number]     Arrival date & time 08/05/15  2320 History   First MD Initiated Contact with Patient 08/06/15 0019     Chief Complaint  Patient presents with  . Chest Pain     (Consider location/radiation/quality/duration/timing/severity/associated sxs/prior Treatment) HPI  Michele Hayden presents to the emergency department for evaluation of chest pain that she had starting at 4 PM. She describes it as a pain that waxes and wanes without any eliciting factors, denies having any complete resolution of her symptoms She had the same symptoms towards the end of last year and saw the cardiologist Dr. Anne Fu who felt like her chest pains were likely related to GERD and anxiety. The patient admits that she has been very anxious, has had belching and has been eating fried foods. She describes her symptoms as tight, with sometimes shortness of breath. She's not had any nausea, vomiting, diarrhea. No LE swelling,denies being on blood thinners or history of clots.   Past Medical History  Diagnosis Date  . Chlamydia infection complicating pregnancy in second trimester 04/10/2013   Past Surgical History  Procedure Laterality Date  . Wisdom tooth extraction    . Dilation and curettage of uterus    . Cesarean section N/A 04/13/2014    Procedure: CESAREAN SECTION;  Surgeon: Tilda Burrow, MD;  Location: [facility];  Service: Obstetrics;  Laterality: N/A;   Family History  Problem Relation Age of Onset  . Asthma Brother   . Diabetes Brother   . Diabetes Paternal Grandmother   . Kidney disease Paternal Grandmother   . Heart disease Paternal Grandmother   . Heart attack Paternal Grandmother   . Diabetes Maternal Aunt    Social History  Substance Use Topics  . Smoking status: Current Some Day Smoker    Types: Cigarettes    Last Attempt to Quit: 10/27/2014  . Smokeless tobacco: Former Neurosurgeon  . Alcohol Use: Yes     Comment: last use 2 months ago as of 02/2015   OB History    Gravida Para Term  Preterm AB TAB SAB Ectopic Multiple Living   Obstetric Comments   04/14/2015 delivery of 23.5wk pre-term infant is no longer a living child.     Review of Systems  Review of Systems All other systems negative except as documented in the HPI. All pertinent positives and negatives as reviewed in the HPI.   Allergies  Review of patient's allergies indicates no known allergies.  Home Medications   Prior to Admission medications   Medication Sig Start Date End Date Taking? Authorizing Provider  calcium carbonate (TUMS - DOSED IN MG ELEMENTAL CALCIUM) 500 MG chewable tablet Chew 1 tablet by mouth daily as needed for indigestion or heartburn.   Yes Historical Provider, MD  Multiple Vitamins-Minerals (MULTIVITAMIN ADULT PO) Take 1 tablet by mouth daily.   Yes Historical Provider, MD  LORazepam (ATIVAN) 1 MG tablet Take 1 tablet (1 mg total) by mouth 3 (three) times daily as needed for anxiety. 08/06/15   Kani Jobson Neva Seat, PA-C  omeprazole (PRILOSEC) 20 MG capsule Take 1 capsule (20 mg total) by mouth daily. 08/06/15   Keyston Ardolino Neva Seat, PA-C   BP 124/73 mmHg  Pulse 64  Temp(Src)  (Oral)  Resp 16  SpO2 98%  LMP 07/31/2015  Breastfeeding? Unknown Physical Exam  Constitutional: She appears well-developed and well-nourished. No distress.  HENT:  Head: Normocephalic and atraumatic.  Right Ear:  Tympanic membrane and ear canal normal.  Left Ear: Tympanic membrane and ear canal normal.  Nose: Nose normal.  Mouth/Throat: Uvula is midline, oropharynx is clear and moist and mucous membranes are normal.  Eyes: Pupils are equal, round, and reactive to light.  Neck: Normal range of motion. Neck supple.  Cardiovascular: Normal rate and regular rhythm.   Pulmonary/Chest: Effort normal and breath sounds normal. She has no decreased breath sounds. She has no wheezes. She has no rhonchi. She exhibits no tenderness and no bony tenderness.  Abdominal: Soft. Bowel sounds are normal. There is  no tenderness. There is no rigidity and no guarding.  No signs of abdominal distention  Musculoskeletal:  No LE swelling  Neurological: She is alert.  Acting at baseline  Skin: Skin is warm and dry. No rash noted.  Nursing note and vitals reviewed.   ED Course  Procedures (including critical care time) Labs Review Labs Reviewed  CBC - Abnormal; Notable for the following:    Hemoglobin 11.9 (*)    HCT 35.2 (*)    All other components within normal limits  BASIC METABOLIC PANEL  I-STAT TROPOININ, ED    Imaging Review Dg Chest 2 View  08/06/2015  CLINICAL DATA:  Chronic intermittent upper mid chest pain. Initial encounter. EXAM: CHEST  2 VIEW COMPARISON:  Chest radiograph performed 02/06/2015 FINDINGS: The lungs are well-aerated and clear. There is no evidence of focal opacification, pleural effusion or pneumothorax. The heart is normal in size; the mediastinal contour is within normal limits. No acute osseous abnormalities are seen. Mild right convex thoracic scoliosis is noted. IMPRESSION: 1. No acute cardiopulmonary process seen. 2. Mild right convex thoracic scoliosis noted. Electronically Signed   By: Roanna Raider M.D.   On: 08/06/2015 01:34   I have personally reviewed and evaluated these images and lab results as part of my medical decision-making.   EKG Interpretation   Date/Time:  Tuesday August 05 2015 23:33:13 EST Ventricular Rate:  61 PR Interval:  140 QRS Duration: 97 QT Interval:  422 QTC Calculation: 425 R Axis:   39 Text Interpretation:  Age not entered, assumed to be  25 years old for  purpose of ECG interpretation Sinus rhythm Confirmed by HORTON  MD,  COURTNEY ([id number]) on 08/06/2015 3:09:09 AM      MDM   Final diagnoses:  Gastroesophageal reflux disease without esophagitis  Anxiety   Patient was given 1 mg PO Ativan and a GI cocktail.She reports having complete resolution of her symptoms.  Patient is to be discharged with recommendation to follow up  with PCP in regards to today's hospital visit. Chest pain is not likely of cardiac or pulmonary etiology d/t presentation, perc negative, VSS, no tracheal deviation, no JVD or new murmur, RRR, breath sounds equal bilaterally, EKG without acute abnormalities, negative troponin, and negative CXR. Pt has been advised start a PPI and return to the ED is CP becomes exertional, associated with diaphoresis or nausea, radiates to left jaw/arm, worsens or becomes concerning in any way. Pt appears reliable for follow up and is agreeable to discharge. Reviewed EKG with Dr. Wilkie Aye prior to dc.   Filed Vitals:   08/06/15 0315  BP: 124/73  Pulse: 64  Resp: [id number], PA-C 08/06/15 2350  Shon Baton, MD 08/07/15 [phone]

## 2015-08-06 NOTE — Discharge Instructions (Signed)
Food Choices for Gastroesophageal Reflux Disease, Adult °When you have gastroesophageal reflux disease (GERD), the foods you eat and your eating habits are very important. Choosing the right foods can help ease the discomfort of GERD. °WHAT GENERAL GUIDELINES DO I NEED TO FOLLOW? °· Choose fruits, vegetables, whole grains, low-fat dairy products, and low-fat meat, fish, and poultry. °· Limit fats such as oils, salad dressings, butter, nuts, and avocado. °· Keep a food diary to identify foods that cause symptoms. °· Avoid foods that cause reflux. These may be different for different people. °· Eat frequent small meals instead of three large meals each day. °· Eat your meals slowly, in a relaxed setting. °· Limit fried foods. °· Cook foods using methods other than frying. °· Avoid drinking alcohol. °· Avoid drinking large amounts of liquids with your meals. °· Avoid bending over or lying down until 2-3 hours after eating. °WHAT FOODS ARE NOT RECOMMENDED? °The following are some foods and drinks that may worsen your symptoms: °Vegetables °Tomatoes. Tomato juice. Tomato and spaghetti sauce. Chili peppers. Onion and garlic. Horseradish. °Fruits °Oranges, grapefruit, and lemon (fruit and juice). °Meats °High-fat meats, fish, and poultry. This includes hot dogs, ribs, ham, sausage, salami, and bacon. °Dairy °Whole milk and chocolate milk. Sour cream. Cream. Butter. Ice cream. Cream cheese.  °Beverages °Coffee and tea, with or without caffeine. Carbonated beverages or energy drinks. °Condiments °Hot sauce. Barbecue sauce.  °Sweets/Desserts °Chocolate and cocoa. Donuts. Peppermint and spearmint. °Fats and Oils °High-fat foods, including French fries and potato chips. °Other °Vinegar. Strong spices, such as black pepper, white pepper, red pepper, cayenne, curry powder, cloves, ginger, and chili powder. °The items listed above may not be a complete list of foods and beverages to avoid. Contact your dietitian for more  information. °  °This information is not intended to replace advice given to you by your health care provider. Make sure you discuss any questions you have with your health care provider. °  °Document Released: 06/14/2005 Document Revised: 07/05/2014 Document Reviewed: 04/18/2013 °Elsevier Interactive Patient Education ©2016 Elsevier Inc[employer] ° °Gastroesophageal Reflux Disease, Adult °Normally, food travels down the esophagus and stays in the stomach to be digested. However, when a person has gastroesophageal reflux disease (GERD), food and stomach acid move back up into the esophagus. When this happens, the esophagus becomes sore and inflamed. Over time, GERD can create small holes (ulcers) in the lining of the esophagus.  °CAUSES °This condition is caused by a problem with the muscle between the esophagus and the stomach (lower esophageal sphincter, or LES). Normally, the LES muscle closes after food passes through the esophagus to the stomach. When the LES is weakened or abnormal, it does not close properly, and that allows food and stomach acid to go back up into the esophagus. The LES can be weakened by certain dietary substances, medicines, and medical conditions, including: °· Tobacco use. °· Pregnancy. °· Having a hiatal hernia. °· Heavy alcohol use. °· Certain foods and beverages, such as coffee, chocolate, onions, and peppermint. °RISK FACTORS °This condition is more likely to develop in: °· People who have an increased body weight. °· People who have connective tissue disorders. °· People who use NSAID medicines. °SYMPTOMS °Symptoms of this condition include: °· Heartburn. °· Difficult or painful swallowing. °· The feeling of having a lump in the throat. °· A bitter taste in the mouth. °· Bad breath. °· Having a large amount of saliva. °· Having an upset or bloated stomach. °· Belching. °· Chest pain. °·   Shortness of breath or wheezing.  Ongoing (chronic) cough or a night-time cough.  Wearing away of  tooth enamel.  Weight loss. Different conditions can cause chest pain. Make sure to see your health care provider if you experience chest pain. DIAGNOSIS Your health care provider will take a medical history and perform a physical exam. To determine if you have mild or severe GERD, your health care provider may also monitor how you respond to treatment. You may also have other tests, including:  An endoscopy toexamine your stomach and esophagus with a small camera.  A test thatmeasures the acidity level in your esophagus.  A test thatmeasures how much pressure is on your esophagus.  A barium swallow or modified barium swallow to show the shape, size, and functioning of your esophagus. TREATMENT The goal of treatment is to help relieve your symptoms and to prevent complications. Treatment for this condition may vary depending on how severe your symptoms are. Your health care provider may recommend:  Changes to your diet.  Medicine.  Surgery. HOME CARE INSTRUCTIONS Diet  Follow a diet as recommended by your health care provider. This may involve avoiding foods and drinks such as:  Coffee and tea (with or without caffeine).  Drinks that containalcohol.  Energy drinks and sports drinks.  Carbonated drinks or sodas.  Chocolate and cocoa.  Peppermint and mint flavorings.  Garlic and onions.  Horseradish.  Spicy and acidic foods, including peppers, chili powder, curry powder, vinegar, hot sauces, and barbecue sauce.  Citrus fruit juices and citrus fruits, such as oranges, lemons, and limes.  Tomato-based foods, such as red sauce, chili, salsa, and pizza with red sauce.  Fried and fatty foods, such as donuts, french fries, potato chips, and high-fat dressings.  High-fat meats, such as hot dogs and fatty cuts of red and white meats, such as rib eye steak, sausage, ham, and bacon.  High-fat dairy items, such as whole milk, butter, and cream cheese.  Eat small,  frequent meals instead of large meals.  Avoid drinking large amounts of liquid with your meals.  Avoid eating meals during the 2-3 hours before bedtime.  Avoid lying down right after you eat.  Do not exercise right after you eat. General Instructions  Pay attention to any changes in your symptoms.  Take over-the-counter and prescription medicines only as told by your health care provider. Do not take aspirin, ibuprofen, or other NSAIDs unless your health care provider told you to do so.  Do not use any tobacco products, including cigarettes, chewing tobacco, and e-cigarettes. If you need help quitting, ask your health care provider.  Wear loose-fitting clothing. Do not wear anything tight around your waist that causes pressure on your abdomen.  Raise (elevate) the head of your bed 6 inches (15cm).  Try to reduce your stress, such as with yoga or meditation. If you need help reducing stress, ask your health care provider.  If you are overweight, reduce your weight to an amount that is healthy for you. Ask your health care provider for guidance about a safe weight loss goal.  Keep all follow-up visits as told by your health care provider. This is important. SEEK MEDICAL CARE IF:  You have new symptoms.  You have unexplained weight loss.  You have difficulty swallowing, or it hurts to swallow.  You have wheezing or a persistent cough.  Your symptoms do not improve with treatment.  You have a hoarse voice. SEEK IMMEDIATE MEDICAL CARE IF:  You have pain  in your arms, neck, jaw, teeth, or back.  You feel sweaty, dizzy, or light-headed.  You have chest pain or shortness of breath.  You vomit and your vomit looks like blood or coffee grounds.  You faint.  Your stool is bloody or black.  You cannot swallow, drink, or eat.   This information is not intended to replace advice given to you by your health care provider. Make sure you discuss any questions you have with  your health care provider.   Document Released: 03/24/2005 Document Revised: 03/05/2015 Document Reviewed: 10/09/2014 Elsevier Interactive Patient Education 2016 Elsevier Inc. Panic Attacks Panic attacks are sudden, short-livedsurges of severe anxiety, fear, or discomfort. They may occur for no reason when you are relaxed, when you are anxious, or when you are sleeping. Panic attacks may occur for a number of reasons:   Healthy people occasionally have panic attacks in extreme, life-threatening situations, such as war or natural disasters. Normal anxiety is a protective mechanism of the body that helps [place] react to danger (fight or flight response).  Panic attacks are often seen with anxiety disorders, such as panic disorder, social anxiety disorder, generalized anxiety disorder, and phobias. Anxiety disorders cause excessive or uncontrollable anxiety. They may interfere with your relationships or other life activities.  Panic attacks are sometimes seen with other mental illnesses, such as depression and posttraumatic stress disorder.  Certain medical conditions, prescription medicines, and drugs of abuse can cause panic attacks. SYMPTOMS  Panic attacks start suddenly, peak within 20 minutes, and are accompanied by four or more of the following symptoms:  Pounding heart or fast heart rate (palpitations).  Sweating.  Trembling or shaking.  Shortness of breath or feeling smothered.  Feeling choked.  Chest pain or discomfort.  Nausea or strange feeling in your stomach.  Dizziness, light-headedness, or feeling like you will faint.  Chills or hot flushes.  Numbness or tingling in your lips or hands and feet.  Feeling that things are not real or feeling that you are not yourself.  Fear of losing control or going crazy.  Fear of dying. Some of these symptoms can mimic serious medical conditions. For example, you may think you are having a heart attack. Although panic attacks can  be very scary, they are not life threatening. DIAGNOSIS  Panic attacks are diagnosed through an assessment by your health care provider. Your health care provider will ask questions about your symptoms, such as where and when they occurred. Your health care provider will also ask about your medical history and use of alcohol and drugs, including prescription medicines. Your health care provider may order blood tests or other studies to rule out a serious medical condition. Your health care provider may refer you to a mental health professional for further evaluation. TREATMENT   Most healthy people who have one or two panic attacks in an extreme, life-threatening situation will not require treatment.  The treatment for panic attacks associated with anxiety disorders or other mental illness typically involves counseling with a mental health professional, medicine, or a combination of both. Your health care provider will help determine what treatment is best for you.  Panic attacks due to physical illness usually go away with treatment of the illness. If prescription medicine is causing panic attacks, talk with your health care provider about stopping the medicine, decreasing the dose, or substituting another medicine.  Panic attacks due to alcohol or drug abuse go away with abstinence. Some adults need professional help in order to stop  drinking or using drugs. HOME CARE INSTRUCTIONS   Take all medicines as directed by your health care provider.   Schedule and attend follow-up visits as directed by your health care provider. It is important to keep all your appointments. SEEK MEDICAL CARE IF:  You are not able to take your medicines as prescribed.  Your symptoms do not improve or get worse. SEEK IMMEDIATE MEDICAL CARE IF:   You experience panic attack symptoms that are different than your usual symptoms.  You have serious thoughts about hurting yourself or others.  You are taking medicine  for panic attacks and have a serious side effect. MAKE SURE YOU:  Understand these instructions.  Will watch your condition.  Will get help right away if you are not doing well or get worse.   This information is not intended to replace advice given to you by your health care provider. Make sure you discuss any questions you have with your health care provider.   Document Released: 06/14/2005 Document Revised: 06/19/2013 Document Reviewed: 01/26/2013 Elsevier Interactive Patient Education [employer]

## 2015-08-06 NOTE — ED Notes (Signed)
See downtime forms for medication administration

## 2015-09-13 ENCOUNTER — Inpatient Hospital Stay (HOSPITAL_COMMUNITY)
Admission: AD | Admit: 2015-09-13 | Discharge: 2015-09-14 | Disposition: A | Discharge status: Home / Self Care | Payer: Self-pay | Source: Ambulatory Visit | Attending: Obstetrics & Gynecology | Admitting: Obstetrics & Gynecology

## 2015-09-13 ENCOUNTER — Encounter (HOSPITAL_COMMUNITY): Payer: Self-pay | Admitting: *Deleted

## 2015-09-13 DIAGNOSIS — Z8619 Personal history of other infectious and parasitic diseases: Secondary | ICD-10-CM | POA: Insufficient documentation

## 2015-09-13 DIAGNOSIS — Z825 Family history of asthma and other chronic lower respiratory diseases: Secondary | ICD-10-CM | POA: Insufficient documentation

## 2015-09-13 DIAGNOSIS — Z87891 Personal history of nicotine dependence: Secondary | ICD-10-CM | POA: Insufficient documentation

## 2015-09-13 DIAGNOSIS — N750 Cyst of Bartholin's gland: Secondary | ICD-10-CM

## 2015-09-13 DIAGNOSIS — Z833 Family history of diabetes mellitus: Secondary | ICD-10-CM | POA: Insufficient documentation

## 2015-09-13 DIAGNOSIS — K219 Gastro-esophageal reflux disease without esophagitis: Secondary | ICD-10-CM | POA: Insufficient documentation

## 2015-09-13 HISTORY — DX: Gastro-esophageal reflux disease without esophagitis: K21.9

## 2015-09-13 LAB — POCT PREGNANCY, URINE: PREG TEST UR: NEGATIVE

## 2015-09-13 LAB — URINALYSIS, ROUTINE W REFLEX MICROSCOPIC
Bilirubin Urine: NEGATIVE
Glucose, UA: NEGATIVE mg/dL
Hgb urine dipstick: NEGATIVE
Ketones, ur: NEGATIVE mg/dL
Leukocytes, UA: NEGATIVE
Nitrite: NEGATIVE
Protein, ur: NEGATIVE mg/dL
Specific Gravity, Urine: >1.03 — ABNORMAL HIGH (ref 1.005–1.030)
pH: 5.5 (ref 5.0–8.0)

## 2015-09-13 NOTE — MAU Note (Signed)
I think i have a vaginal cyst. Feel a lump at the side of vagina that is really painful. Lump came 2 months ago but went away. This time has been there 3-4 days. No bleeding or discharge. Positive upt about 3 days ago.

## 2015-09-14 DIAGNOSIS — N750 Cyst of Bartholin's gland: Secondary | ICD-10-CM

## 2015-09-14 LAB — WET PREP, GENITAL
Clue Cells Wet Prep HPF POC: NONE SEEN
Sperm: NONE SEEN
Trich, Wet Prep: NONE SEEN
Yeast Wet Prep HPF POC: NONE SEEN

## 2015-09-14 LAB — HCG, QUANTITATIVE, PREGNANCY: hCG, Beta Chain, Quant, S: <1 m[IU]/mL (ref <5)

## 2015-09-14 NOTE — Discharge Instructions (Signed)
Bartholin Cyst or Abscess  A Bartholin cyst is a fluid-filled sac that forms on a Bartholin gland. Bartholin glands are small glands that are found in the folds of skin (labia) on the sides of the lower opening of the vagina.  This type of cyst causes a bulge on the side of the vagina. A cyst that is not large or infected may not cause problems. However, if the fluid in the cyst becomes infected, the cyst can turn into an abscess. An abscess may cause discomfort or pain.  HOME CARE  · Take medicines only as told by your doctor.  · If you were prescribed an antibiotic medicine, finish all of it even if you start to feel better.  · Apply warm, wet compresses to the area or take warm, shallow baths that cover your pelvic area (sitz baths). Do this many times each day or as told by your doctor.  · Do not squeeze the cyst. Do not apply heavy pressure to it.  · Do not have sex until the cyst has gone away.  · If your cyst or abscess was opened by your doctor, a small piece of gauze or a drain may have been placed in the area. That lets the cyst drain. Do not remove the gauze or the drain until your doctor tells you it is okay to do that.  · Do not wear tampons. Wear feminine pads as needed for any fluid or blood.  · Keep all follow-up visits as told by your doctor. This is important.  GET HELP IF:  · Your pain, puffiness (swelling), or redness in the area of the cyst gets worse.  · You have fluid or pus pus coming from the cyst.  · You have a fever.     This information is not intended to replace advice given to you by your health care provider. Make sure you discuss any questions you have with your health care provider.     Document Released: 09/10/2008 Document Revised: 10/29/2014 Document Reviewed: 01/28/2014  Elsevier Interactive Patient Education ©2016 Else[employer] Inc[employer]

## 2015-09-14 NOTE — MAU Provider Note (Signed)
History     CSN: [id number]  Arrival date and time: 09/13/15 2304   First Provider Initiated Contact with Patient 09/14/15 0000      Chief Complaint  Patient presents with  . Groin Swelling   HPI  Michele Hayden 25 y.o. [phone] presents with an unsure positive home pregnancy test.pt states she is only 2 days late for her period and a vaginal lump on the inside of vagina.   Past Medical History  Diagnosis Date  . Chlamydia infection complicating pregnancy in second trimester 04/10/2013  . GERD (gastroesophageal reflux disease)     Past Surgical History  Procedure Laterality Date  . Wisdom tooth extraction    . Dilation and curettage of uterus    . Cesarean section N/A 04/13/2014    Procedure: CESAREAN SECTION;  Surgeon: Tilda BurrowJohn Ferguson V, MD;  Location: [facility];  Service: Obstetrics;  Laterality: N/A;    Family History  Problem Relation Age of Onset  . Asthma Brother   . Diabetes Brother   . Diabetes Paternal Grandmother   . Kidney disease Paternal Grandmother   . Heart disease Paternal Grandmother   . Heart attack Paternal Grandmother   . Diabetes Maternal Aunt     Social History  Substance Use Topics  . Smoking status: Former Smoker    Types: Cigarettes    Quit date: 06/28/2014  . Smokeless tobacco: Former NeurosurgeonUser  . Alcohol Use: Yes     Comment: occasional    Allergies: No Known Allergies  Prescriptions prior to admission  Medication Sig Dispense Refill Last Dose  . calcium carbonate (TUMS - DOSED IN MG ELEMENTAL CALCIUM) 500 MG chewable tablet Chew 1 tablet by mouth daily as needed for indigestion or heartburn.   08/05/2015 at Unknown time  . LORazepam (ATIVAN) 1 MG tablet Take 1 tablet (1 mg total) by mouth 3 (three) times daily as needed for anxiety. 6 tablet 0   . Multiple Vitamins-Minerals (MULTIVITAMIN ADULT PO) Take 1 tablet by mouth daily.   2 weeks  . omeprazole (PRILOSEC) 20 MG capsule Take 1 capsule (20 mg total) by mouth daily. 30 capsule 0      Review of Systems  Constitutional: Negative for fever.  Genitourinary:       Vaginal lump  All other systems reviewed and are negative.  Physical Exam   Blood pressure 141/85, pulse 70, temperature 98.1 F (36.7 C), resp. rate 18, height 5\' 7"  (1.702 m), weight 247 lb (112.038 kg), last menstrual period 08/12/2015, unknown if currently breastfeeding.  Physical Exam  Nursing note and vitals reviewed. Constitutional: She is oriented to person, place, and time. She appears well-developed and well-nourished.  HENT:  Head: Normocephalic and atraumatic.  Neck: Normal range of motion.  Cardiovascular: Normal rate.   Respiratory: Effort normal. No respiratory distress.  GI: Soft.  Genitourinary:     2cm bartholins cyst that is firm to palpation.  Musculoskeletal: Normal range of motion.  Neurological: She is alert and oriented to person, place, and time.  Skin: Skin is warm and dry.  Psychiatric: She has a normal mood and affect. Her behavior is normal. Judgment and thought content normal.   Results for orders placed or performed during the hospital encounter of 09/13/15 (from the past 24 hour(s))  Urinalysis, Routine w reflex microscopic ([facility])     Status: Abnormal   Collection Time: 09/13/15 11:30 PM  Result Value Ref Range   Color, Urine YELLOW YELLOW   APPearance CLEAR CLEAR  Specific Gravity, Urine >1.030 (H) 1.005 - 1.030   pH 5.5 5.0 - 8.0   Glucose, UA NEGATIVE NEGATIVE mg/dL   Hgb urine dipstick NEGATIVE NEGATIVE   Bilirubin Urine NEGATIVE NEGATIVE   Ketones, ur NEGATIVE NEGATIVE mg/dL   Protein, ur NEGATIVE NEGATIVE mg/dL   Nitrite NEGATIVE NEGATIVE   Leukocytes, UA NEGATIVE NEGATIVE  Pregnancy, urine POC     Status: None   Collection Time: 09/13/15 11:38 PM  Result Value Ref Range   Preg Test, Ur NEGATIVE NEGATIVE  Bet HCG Quant <1 MAU Course  Procedures  MDM Wet prep neg; Will advise warm compresses; sitz baths and tylenol , ibuprofen for pain  relief  Assessment and Plan  Bartholins cyst Apply warm compresses to affected area Warm sitz baths Tylenol/ibuprofen for pain Return to MAU if symptoms worsen Discharge     Yoshito Gaza Grissett 09/14/2015, 12:01 AM

## 2015-09-16 ENCOUNTER — Encounter (HOSPITAL_COMMUNITY): Payer: Self-pay

## 2015-09-16 ENCOUNTER — Inpatient Hospital Stay (HOSPITAL_COMMUNITY)
Admission: AD | Admit: 2015-09-16 | Discharge: 2015-09-17 | Disposition: A | Discharge status: Home / Self Care | Payer: Medicaid Other | Source: Ambulatory Visit | Attending: Obstetrics & Gynecology | Admitting: Obstetrics & Gynecology

## 2015-09-16 DIAGNOSIS — N751 Abscess of Bartholin's gland: Secondary | ICD-10-CM | POA: Insufficient documentation

## 2015-09-16 DIAGNOSIS — Z833 Family history of diabetes mellitus: Secondary | ICD-10-CM | POA: Insufficient documentation

## 2015-09-16 DIAGNOSIS — N750 Cyst of Bartholin's gland: Secondary | ICD-10-CM

## 2015-09-16 DIAGNOSIS — Z87891 Personal history of nicotine dependence: Secondary | ICD-10-CM | POA: Insufficient documentation

## 2015-09-16 DIAGNOSIS — Z825 Family history of asthma and other chronic lower respiratory diseases: Secondary | ICD-10-CM | POA: Insufficient documentation

## 2015-09-16 NOTE — MAU Note (Signed)
Pt states she came Saturday night for Bartholin's cyst-was unable to drain. States that the pain is much worse tonight. Took ibuprofen and aleve and has not helped-last took 1930. Has also done warm compresses which have not helped.

## 2015-09-17 DIAGNOSIS — N750 Cyst of Bartholin's gland: Secondary | ICD-10-CM

## 2015-09-17 MED ORDER — OXYCODONE-ACETAMINOPHEN 5-325 MG PO TABS: 1.0000 | ORAL_TABLET | Freq: Four times a day (QID) | ORAL | Status: DC | PRN

## 2015-09-17 MED ORDER — SULFAMETHOXAZOLE-TRIMETHOPRIM 800-160 MG PO TABS: 1.0000 | ORAL_TABLET | Freq: Two times a day (BID) | ORAL | Status: DC

## 2015-09-17 NOTE — MAU Provider Note (Signed)
History     CSN: [id number]  Arrival date and time: 09/16/15 2330   First Provider Initiated Contact with Patient 09/17/15 0009      Chief Complaint  Patient presents with  . Bartholin's Cyst   HPI Ms. [name] A Hollar is a 25 y.o. [phone] who presents to MAU today with complaint of bartholin's gland cyst. The patient was seen on 3/19 and told that the area was not ready to be drained. She has been taking sitz baths and using warm compresses at least 3 times a day since then. She feels that the pain was worse tonight. She thinks the area may be a bit bigger. She denies drainage, fever or vaginal discharge. She has taking Ibuprofen for pain with minimal relief.  OB History    Gravida Para Term Preterm AB TAB SAB Ectopic Multiple Living   4 2 1 1 2 1 1   1       Obstetric Comments   04/14/2015 delivery of 23.5wk pre-term infant is no longer a living child.      Past Medical History  Diagnosis Date  . Chlamydia infection complicating pregnancy in second trimester 04/10/2013  . GERD (gastroesophageal reflux disease)     Past Surgical History  Procedure Laterality Date  . Wisdom tooth extraction    . Dilation and curettage of uterus    . Cesarean section N/A 04/13/2014    Procedure: CESAREAN SECTION;  Surgeon: Tilda BurrowJohn Ferguson V, MD;  Location: [facility];  Service: Obstetrics;  Laterality: N/A;    Family History  Problem Relation Age of Onset  . Asthma Brother   . Diabetes Brother   . Diabetes Paternal Grandmother   . Kidney disease Paternal Grandmother   . Heart disease Paternal Grandmother   . Heart attack Paternal Grandmother   . Diabetes Maternal Aunt     Social History  Substance Use Topics  . Smoking status: Former Smoker    Types: Cigarettes    Quit date: 06/28/2014  . Smokeless tobacco: Former NeurosurgeonUser  . Alcohol Use: Yes     Comment: occasional    Allergies: No Known Allergies  Prescriptions prior to admission  Medication Sig Dispense Refill Last Dose  .  calcium carbonate (TUMS - DOSED IN MG ELEMENTAL CALCIUM) 500 MG chewable tablet Chew 1 tablet by mouth daily as needed for indigestion or heartburn.   08/05/2015 at Unknown time  . LORazepam (ATIVAN) 1 MG tablet Take 1 tablet (1 mg total) by mouth 3 (three) times daily as needed for anxiety. 6 tablet 0   . Multiple Vitamins-Minerals (MULTIVITAMIN ADULT PO) Take 1 tablet by mouth daily.   2 weeks  . omeprazole (PRILOSEC) 20 MG capsule Take 1 capsule (20 mg total) by mouth daily. 30 capsule 0     Review of Systems  Constitutional: Negative for fever and malaise/fatigue.  Genitourinary:       Neg - vaginal bleeding, discharge + bartholin's gland cyst without drainage   Physical Exam   Blood pressure 142/84, pulse 68, temperature 98.1 F (36.7 C), temperature source Oral, resp. rate 16, height 5' 6.5" (1.689 m), weight 248 lb 3.2 oz (112.583 kg), last menstrual period 08/12/2015, SpO2 98 %, unknown if currently breastfeeding.  Physical Exam  Nursing note and vitals reviewed. Constitutional: She is oriented to person, place, and time. She appears well-developed and well-nourished. No distress.  HENT:  Head: Normocephalic and atraumatic.  Cardiovascular: Normal rate.   Respiratory: Effort normal.  GI: Soft.  Genitourinary:  No vaginal discharge found.  Neurological: She is alert and oriented to person, place, and time.  Skin: Skin is warm and dry. No erythema.  Psychiatric: She has a normal mood and affect.    MAU Course  Procedures None  MDM Discussed option of I&D vs continued hot compressed, sitz baths and antibiotics. Patient declines I&D at this time.  Message sent to [facility] clinical pool to call patient in a few days to see if she needs appointment for drainage or if this has resolved.   Assessment and Plan  A: Bartholin's Gland abscess  P: Discharge home Rx for Bactrim and Percocet given to patient  Warning signs for worsening condition discussed Continued sitz baths  and warm compresses to promote drainage Patient advised to follow-up with [facility] if symptoms persist or worsen Patient may return to MAU as needed or if her condition were to change or worsen   Marny Lowenstein, PA-C  09/17/2015, 12:28 AM

## 2015-09-17 NOTE — Discharge Instructions (Signed)
Bartholin Cyst or Abscess A Bartholin cyst is a fluid-filled sac that forms on a Bartholin gland. Bartholin glands are small glands that are located within the folds of skin (labia) along the sides of the lower opening of the vagina. These glands produce a fluid to moisten the outside of the vagina during sexual intercourse. A Bartholin cyst causes a bulge on the side of the vagina. A cyst that is not large or infected may not cause symptoms or problems. However, if the fluid within the cyst becomes infected, the cyst can turn into an abscess. An abscess may cause discomfort or pain. CAUSES A Bartholin cyst may develop when the duct of the gland becomes blocked. In many cases, the cause of this is not known. Various kinds of bacteria can cause the cyst to become infected and develop into an abscess. RISK FACTORS You may be at an increased risk of developing a Bartholin cyst or abscess if:  You are a woman of reproductive age.  You have a history of previous Bartholin cysts or abscesses.  You have diabetes.  You have a sexually transmitted disease (STD). SIGNS AND SYMPTOMS The severity of symptoms varies depending on the size of the cyst and whether it is infected. Symptoms may include:  A bulge or swelling near the lower opening of your vagina.  Discomfort or pain.  Redness.  Pain during sexual intercourse.  Pain when walking.  Fluid draining from the area. DIAGNOSIS Your health care provider may make a diagnosis based on your symptoms and a physical exam. He or she will look for swelling in your vaginal area. Blood tests may be done to check for infections. A sample of fluid from the cyst or abscess may also be taken to be tested in a lab. TREATMENT Small cysts that are not infected may not require any treatment. These often go away on their own. Yourhealth care provider will recommend hot baths and the use of warm compresses. These may also be part of the treatment for an abscess.  Treatment options for a large cyst or abscess may include:   Antibiotic medicine.  A surgical procedure to drain the abscess. One of the following procedures may be done:  Incision and drainage. An incision is made in the cyst or abscess so that the fluid drains out. A catheter may be placed inside the cyst so that it does not close and fill up with fluid again. The catheter will be removed after you have a follow-up visit with a specialist (gynecologist).  Marsupialization. The cyst or abscess is opened and kept open by stitching the edges of the skin to the walls of the cyst or abscess. This allows it to continue to drain and not fill up with fluid again. If you have cysts or abscesses that keep returning and have required incision and drainage multiple times, your health care provider may talk to you about surgery to remove the Bartholin gland. HOME CARE INSTRUCTIONS  Take medicines only as directed by your health care provider.  If you were prescribed an antibiotic medicine, finish it all even if you start to feel better.  Apply warm, wet compresses to the area or take warm, shallow baths that cover your pelvic region (sitz baths) several times a day or as directed by your health care provider.  Do not squeeze the cyst or apply heavy pressure to it.  Do not have sexual intercourse until the cyst has gone away.  If your cyst or abscess was   opened, a small piece of gauze or a drain may have been placed in the area to allow drainage. Do not remove the gauze or the drain until directed by your health care provider.  Wear feminine pads--not tampons--as needed for any drainage or bleeding.  Keep all follow-up visits as directed by your health care provider. This is important. PREVENTION Take these steps to help prevent a Bartholin cyst from returning:  Practice good hygiene.   Clean your vaginal area with mild soap and a soft cloth when you bathe.  Practice safe sex to prevent  STDs. SEEK MEDICAL CARE IF:  You have increased pain, swelling, or redness in the area of the cyst.  Puslike drainage is coming from the cyst.  You have a fever.   This information is not intended to replace advice given to you by your health care provider. Make sure you discuss any questions you have with your health care provider.   Document Released: 06/14/2005 Document Revised: 07/05/2014 Document Reviewed: 01/28/2014 Elsevier Interactive Patient Education 2016 Else[employer] Inc.  

## 2015-09-19 ENCOUNTER — Telehealth: Payer: Self-pay

## 2015-09-19 NOTE — Telephone Encounter (Signed)
Spoke with patient her abscess has resolve. No need for a follow up.

## 2015-10-31 ENCOUNTER — Telehealth: Payer: Self-pay | Admitting: Cardiology

## 2015-10-31 NOTE — Telephone Encounter (Signed)
New Message  Pt stated- she had not gotten results from her Echo- 02/2015. Please call back and discuss.

## 2015-10-31 NOTE — Telephone Encounter (Signed)
Spoke with pt, aware of echo results. She is concerned because she is getting a heaviness in her chest on a daily basis. The heaviness is located in the center of the chest and occurs when moving around. The heaviness will go away if lays down. She reports having reflux but not taking her prilosec on a daily basis. Reassurance given to pt, advised her to take the prilosec on a daily basis and to f/u with medical doctor if symptoms persist.

## 2015-11-11 ENCOUNTER — Institutional Professional Consult (permissible substitution): Payer: Medicaid Other | Admitting: Pulmonary Disease

## 2015-11-12 ENCOUNTER — Encounter: Payer: Self-pay | Admitting: Internal Medicine

## 2015-11-12 ENCOUNTER — Ambulatory Visit (INDEPENDENT_AMBULATORY_CARE_PROVIDER_SITE_OTHER): Payer: Self-pay | Admitting: Internal Medicine

## 2015-11-12 VITALS — BP 108/70 | HR 70 | Ht 63.0 in | Wt 239.0 lb

## 2015-11-12 DIAGNOSIS — R05 Cough: Secondary | ICD-10-CM

## 2015-11-12 DIAGNOSIS — R058 Other specified cough: Secondary | ICD-10-CM

## 2015-11-12 NOTE — Progress Notes (Signed)
Subjective:    Patient ID: Michele Hayden, female    DOB: [birth year],   MRN: [id number]  HPI  33 yobf quit smoking around 06/2015 with h/o allergic rhinitis growing up in [place] worse spring in summer but never saw specialist usually ok with clariton/nasal steroids moved 2009 to [place] and then started fall 2016 with intermittent chest/ throat tightness/sob with neg cardiac w/u by [facility] in 03/11/15 and bad to worse Jan 2017 so stopped smoking > no better then IUP onset first week in April 2017 and referred herself to pulmonary clinic 11/12/2015    11/12/2015 1st [facility] office visit/ Jasmin Winberry  [redacted] weeks pregnant Chief Complaint  Patient presents with  . Pulmonary Consult    Self referral. Pt c/o cough and chest tightness x 6 months. Cough is prod with blood streaked sputum.    variable chest tightness central anterior  x fall 2016 never completely resolves though eases when supine and worse when walk/ def worse when eat esp gassy foods. Has day > noct  Cough worst  in am's no sign purulent mucus or hemoptysis (tiny amts of blood with severe coughing fits) and assoc rhinitis flares with sense of pnds and note chest tightness also worse with cough  No obvious other patterns in day to day or daytime variabilty or assoc chronic cough or cp or chest tightness, subjective wheeze overt sinus or hb symptoms. No unusual exp hx or h/o childhood pna/ asthma or knowledge of premature birth.  Sleeping ok without nocturnal  or early am exacerbation  of respiratory  c/o's or need for noct saba. Also denies any obvious fluctuation of symptoms with weather or environmental changes or other aggravating or alleviating factors except as outlined above   Current Medications, Allergies, Complete Past Medical History, Past Surgical History, Family History, and Social History were reviewed in [employer] record.            Review of Systems  Constitutional: Positive for appetite  change. Negative for fever, chills and unexpected weight change.  HENT: Positive for congestion, ear pain, postnasal drip and sneezing. Negative for dental problem, nosebleeds, rhinorrhea, sinus pressure, sore throat, trouble swallowing and voice change.   Eyes: Negative for visual disturbance.  Respiratory: Positive for cough and shortness of breath. Negative for choking.   Cardiovascular: Negative for chest pain and leg swelling.  Gastrointestinal: Negative for vomiting, abdominal pain and diarrhea.  Genitourinary: Negative for difficulty urinating.  Musculoskeletal: Positive for arthralgias.  Skin: Negative for rash.  Neurological: Negative for tremors, syncope and headaches.  Hematological: Does not bruise/bleed easily.       Objective:   Physical Exam  amb bf nad  Wt Readings from Last 3 Encounters:  11/12/15 239 lb (108.41 kg)  09/16/15 248 lb 3.2 oz (112.583 kg)  09/13/15 247 lb (112.038 kg)    Vital signs reviewed - note sats are 99% and BP is fine   HEENT: nl dentition, turbinates, and oropharynx. Nl external ear canals without cough reflex   NECK :  without JVD/Nodes/TM/ nl carotid upstrokes bilaterally   LUNGS: no acc muscle use,  Nl contour chest which is clear to A and P bilaterally without cough on insp or exp maneuvers   CV:  RRR  no s3 or murmur or increase in P2, no edema   ABD:  soft and nontender with nl inspiratory excursion in the supine position. No bruits or organomegaly, bowel sounds nl  MS:  Nl gait/ ext warm  without deformities, calf tenderness, cyanosis or clubbing No obvious joint restrictions   SKIN: warm and dry without lesions    NEURO:  alert, approp, nl sensorium with  no motor deficits       I personally reviewed images and agree with radiology impression as follows:  CXR:  08/06/15 1. No acute cardiopulmonary process seen. 2. Mild right convex thoracic scoliosis noted.      Assessment & Plan:

## 2015-11-12 NOTE — Patient Instructions (Addendum)
Almost certainly your symptoms are due to reflux.  GERD (REFLUX)  is an extremely common cause of  ymptoms just like yours , many times with no obvious heartburn at all.    It can be treated with medication, but also with lifestyle changes including elevation of the head of your bed (ideally with 6 inch  bed blocks),  Smoking cessation, avoidance of late meals, excessive alcohol, and avoid fatty foods, chocolate, peppermint, colas, red wine, and acidic juices such as orange juice.  NO MINT OR MENTHOL PRODUCTS SO NO COUGH DROPS  USE SUGARLESS CANDY INSTEAD (Jolley ranchers or Stover's or Life Savers) or even ice chips will also do - the key is to swallow to prevent all throat clearing. NO OIL BASED VITAMINS - use powdered substitutes.  maalox plus or mylanta II but not tums (no calcium carbonate)  If you doctor's will allow :  Pantoprazole (protonix) 40 mg   Take  30-60 min before first meal of the day and Pepcid (famotidine)  20 mg one @  bedtime  But I would only rec considering this after your first trimester   Pulmonary follow up is as needed

## 2015-11-13 DIAGNOSIS — R05 Cough: Secondary | ICD-10-CM | POA: Insufficient documentation

## 2015-11-13 DIAGNOSIS — R058 Other specified cough: Secondary | ICD-10-CM | POA: Insufficient documentation

## 2015-11-13 NOTE — Assessment & Plan Note (Addendum)
Cl Upper airway cough syndrome, so named because it's frequently impossible to sort out how much is  CR/sinusitis with freq throat clearing (which can be related to primary GERD)   vs  causing  secondary (" extra esophageal")  GERD from wide swings in gastric pressure that occur with throat clearing, often  promoting self use of mint and menthol lozenges that reduce the lower esophageal sphincter tone and exacerbate the problem further in a cyclical fashion.   These are the same pts (now being labeled as having "irritable larynx syndrome" by some cough centers) who not infrequently have a history of having failed to tolerate ace inhibitors,  dry powder inhalers or biphosphonates or report having atypical reflux symptoms that don't respond to standard doses of PPI , and are easily confused as having aecopd or asthma flares by even experienced allergists/ pulmonologists.    While not intuitively obvious, many patients with chronic low grade reflux do not cough until there is a secondary insult that disturbs the protective epithelial barrier and exposes sensitive nerve endings.  This can be viral or direct physical injury such as with an endotracheal tube.   The point is that once this occurs, it is difficult to eliminate using anything but a maximally effective acid suppression regimen at least in the short run, accompanied by an appropriate diet to address non acid GERD.   She at risk of this worsening now as has gained 30 lb and has IUP and this would also explain her atypical chest tightness and why this worsens when she coughs (which causes worse gerd).  Since 6 weeks IUP is very delicate phase (due to organogenesis) I rec all rx be approved by OB but would start with otcs like mylanta II or maalox plus then perhaps later in pregnancy if needed add daily ppi qam ac and hs h2 but start strict diet/ lifestyle changes now.  Total time devoted to counseling  = 35/8463m review case with pt/ discussion of  options/alternatives/ personally creating written instructions  in presence of pt  then going over those specific  Instructions directly with the pt including how to use all of the meds but in particular covering each new medication in detail and the difference between the maintenance/automatic meds and the prns using an action plan format for the latter.

## 2015-11-26 ENCOUNTER — Encounter: Payer: Medicaid Other | Admitting: Obstetrics & Gynecology

## 2015-12-03 ENCOUNTER — Telehealth: Payer: Self-pay | Admitting: Internal Medicine

## 2015-12-03 DIAGNOSIS — R059 Cough, unspecified: Secondary | ICD-10-CM

## 2015-12-03 DIAGNOSIS — R05 Cough: Secondary | ICD-10-CM

## 2015-12-03 NOTE — Telephone Encounter (Signed)
Spoke with pt. States that she wants a referral to ENT. There is nothing documented by MW in regards to pt needing a referral.  MW - please advise. Thanks.

## 2015-12-03 NOTE — Telephone Encounter (Signed)
Fine with me, dx cough / [facility]

## 2015-12-03 NOTE — Telephone Encounter (Signed)
Spoke with pt. She is aware that we will place referral. This has been done. Nothing further was needed.

## 2015-12-05 ENCOUNTER — Telehealth: Payer: Self-pay | Admitting: Internal Medicine

## 2015-12-05 NOTE — Telephone Encounter (Signed)
Cordelia PenSherry called her  [facility]  Thanks

## 2015-12-08 ENCOUNTER — Encounter: Payer: Self-pay | Admitting: Obstetrics & Gynecology

## 2015-12-08 ENCOUNTER — Encounter: Payer: Medicaid Other | Admitting: Obstetrics & Gynecology

## 2015-12-08 NOTE — Telephone Encounter (Signed)
I returned pt's call & left vm for her to call me back for ENT appt info.  I have given her my direct ph # each time I have called.

## 2015-12-09 NOTE — Telephone Encounter (Signed)
I spoke to pt & gave her appt info.  Nothing further needed. 

## 2015-12-17 ENCOUNTER — Institutional Professional Consult (permissible substitution): Payer: Medicaid Other | Admitting: Pulmonary Disease

## 2015-12-18 ENCOUNTER — Ambulatory Visit (INDEPENDENT_AMBULATORY_CARE_PROVIDER_SITE_OTHER): Payer: Medicaid Other | Admitting: Advanced Practice Midwife

## 2015-12-18 ENCOUNTER — Encounter: Payer: Self-pay | Admitting: Advanced Practice Midwife

## 2015-12-18 ENCOUNTER — Other Ambulatory Visit (HOSPITAL_COMMUNITY)
Admission: RE | Admit: 2015-12-18 | Discharge: 2015-12-18 | Disposition: A | Discharge status: Home / Self Care | Payer: Medicaid Other | Source: Ambulatory Visit | Attending: Advanced Practice Midwife | Admitting: Advanced Practice Midwife

## 2015-12-18 VITALS — BP 114/74 | HR 74 | Ht 67.0 in | Wt 231.0 lb

## 2015-12-18 DIAGNOSIS — Z3492 Encounter for supervision of normal pregnancy, unspecified, second trimester: Secondary | ICD-10-CM

## 2015-12-18 DIAGNOSIS — Z113 Encounter for screening for infections with a predominantly sexual mode of transmission: Secondary | ICD-10-CM | POA: Insufficient documentation

## 2015-12-18 DIAGNOSIS — Z3482 Encounter for supervision of other normal pregnancy, second trimester: Secondary | ICD-10-CM | POA: Diagnosis not present

## 2015-12-18 DIAGNOSIS — Z36 Encounter for antenatal screening of mother: Secondary | ICD-10-CM

## 2015-12-18 DIAGNOSIS — O0993 Supervision of high risk pregnancy, unspecified, third trimester: Secondary | ICD-10-CM

## 2015-12-18 NOTE — Progress Notes (Signed)
Subjective:    Michele Hayden is a [id number] 1646w2d being seen today for her first obstetrical visit.  Her obstetrical history is significant for NSVD x 1 at term, then abruption and classical C/S at 23 weeks. Pregnancy history fully reviewed.  Patient reports no complaints.  Filed Vitals:   12/18/15 1338 12/18/15 1355  BP: 114/74   Pulse: 74   Height:  5\' 7"  (1.702 m)  Weight: 231 lb (104.781 kg)     HISTORY: OB History  Gravida Para Term Preterm AB SAB TAB Ectopic Multiple Living  5 2 1 1 2 1 1   1     # Outcome Date GA Lbr Len/2nd Weight Sex Delivery Anes PTL Lv  5 Current           4 TAB 09/27/14          3 Preterm 04/13/14 2034w5d  1 lb 2.7 oz (0.53 kg) F CS-LVertical EPI,Local  ND  2 Term 09/18/13 6762w5d 10:15 / 01:05 7 lb 7 oz (3.374 kg) M Vag-Spont EPI  Y  1 SAB             Obstetric Comments  04/14/2015 delivery of 23.5wk pre-term infant is no longer a living child.   Past Medical History  Diagnosis Date  . Chlamydia infection complicating pregnancy in second trimester 04/10/2013  . GERD (gastroesophageal reflux disease)    Past Surgical History  Procedure Laterality Date  . Wisdom tooth extraction    . Dilation and curettage of uterus    . Cesarean section N/A 04/13/2014    Procedure: CESAREAN SECTION;  Surgeon: Tilda BurrowJohn Ferguson V, MD;  Location: [facility];  Service: Obstetrics;  Laterality: N/A;   Family History  Problem Relation Age of Onset  . Asthma Brother   . Diabetes Brother   . Diabetes Paternal Grandmother   . Kidney disease Paternal Grandmother   . Heart disease Paternal Grandmother   . Heart attack Paternal Grandmother   . Diabetes Maternal Aunt      Exam    Uterus:     Pelvic Exam:    Perineum: No Hemorrhoids, Normal Perineum   Vulva: normal   Vagina:  normal mucosa, normal discharge   pH:    Cervix: multiparous appearance, no bleeding following Pap and no cervical motion tenderness   Adnexa: normal adnexa and no mass, fullness,  tenderness   Bony Pelvis: average  System: Breast:  normal appearance, no masses or tenderness   Skin: normal coloration and turgor, no rashes    Neurologic: oriented, normal, normal mood, gait normal; reflexes normal and symmetric   Extremities: normal strength, tone, and muscle mass, ROM of all joints is normal   HEENT sclera clear, anicteric   Mouth/Teeth mucous membranes moist, pharynx normal without lesions and dental hygiene good   Neck supple and no masses   Cardiovascular: regular rate and rhythm   Respiratory:  appears well, vitals normal, no respiratory distress, acyanotic, normal RR, ear and throat exam is normal, neck free of mass or lymphadenopathy, chest clear, no wheezing, crepitations, rhonchi, normal symmetric air entry   Abdomen: soft, non-tender; bowel sounds normal; no masses,  no organomegaly   Urinary: urethral meatus normal      Assessment:    Pregnancy: [id number] Patient Active Problem List   Diagnosis Date Noted  . Upper airway cough syndrome 11/13/2015  . History of preterm delivery, currently pregnant 10/08/2014  . History of placental abruption 10/08/2014  . S/P emergency classical cesarean section  04/18/2014  . Wheezing 09/14/2013  . History of prior pregnancy with short cervix, currently pregnant 06/02/2013  . Hx of maternal chlamydia infection, currently pregnant 04/10/2013        Plan:     Initial labs drawn. Prenatal vitamins. Problem list reviewed and updated. Genetic Screening discussed Quad Screen: requested.  Ultrasound discussed; fetal survey: requested.  Follow up in 4 weeks.    LEFTWICH-KIRBY, Allisen Pidgeon 12/18/2015

## 2015-12-18 NOTE — Progress Notes (Signed)
Pt here for initial prenatal visit. Bedside US shows single IUP with FHR 156 and HC measuring 7536w0d.

## 2015-12-19 LAB — PRENATAL PROFILE (SOLSTAS)
Antibody Screen: NEGATIVE
Basophils Absolute: 0 cells/uL (ref 0–200)
Basophils Relative: 0 %
EOS PCT: 2 %
Eosinophils Absolute: 154 cells/uL (ref 15–500)
HEMATOCRIT: 35.9 % (ref 35.0–45.0)
HEMOGLOBIN: 11.8 g/dL (ref 11.7–15.5)
HIV 1&2 Ab, 4th Generation: NONREACTIVE
Hepatitis B Surface Ag: NEGATIVE
LYMPHS PCT: 18 %
Lymphs Abs: 1386 cells/uL (ref 850–3900)
MCH: 29.5 pg (ref 27.0–33.0)
MCHC: 32.9 g/dL (ref 32.0–36.0)
MCV: 89.8 fL (ref 80.0–100.0)
MONOS PCT: 9 %
MPV: 14 fL — ABNORMAL HIGH (ref 7.5–12.5)
Monocytes Absolute: 693 cells/uL (ref 200–950)
NEUTROS PCT: 71 %
Neutro Abs: 5467 cells/uL (ref 1500–7800)
Platelets: 130 10*3/uL — ABNORMAL LOW (ref 140–400)
RBC: 4 MIL/uL (ref 3.80–5.10)
RDW: 13.2 % (ref 11.0–15.0)
RH TYPE: POSITIVE
RUBELLA: 9 {index} — AB (ref <0.90)
WBC: 7.7 10*3/uL (ref 3.8–10.8)

## 2015-12-19 LAB — GC/CHLAMYDIA PROBE AMP (~~LOC~~) NOT AT ARMC
Chlamydia: NEGATIVE
Neisseria Gonorrhea: NEGATIVE

## 2015-12-20 LAB — PAIN MGMT, PROFILE 6 CONF W/O MM, U
6 ACETYLMORPHINE: NEGATIVE ng/mL (ref <10)
AMPHETAMINES: NEGATIVE ng/mL (ref <500)
Alcohol Metabolites: NEGATIVE ng/mL (ref <500)
Barbiturates: NEGATIVE ng/mL (ref <300)
Benzodiazepines: NEGATIVE ng/mL (ref <100)
Cocaine Metabolite: NEGATIVE ng/mL (ref <150)
Creatinine: 141.8 mg/dL (ref >=20.0)
Marijuana Metabolite: NEGATIVE ng/mL (ref <20)
Methadone Metabolite: NEGATIVE ng/mL (ref <100)
OPIATES: NEGATIVE ng/mL (ref <100)
OXIDANT: NEGATIVE ug/mL (ref <200)
Oxycodone: NEGATIVE ng/mL (ref <100)
PH: 6.7 (ref 4.5–9.0)
PLEASE NOTE: 0
Phencyclidine: NEGATIVE ng/mL (ref <25)

## 2015-12-20 LAB — CULTURE, OB URINE
COLONY COUNT: NO GROWTH
ORGANISM ID, BACTERIA: NO GROWTH

## 2016-01-13 ENCOUNTER — Encounter: Payer: Self-pay | Admitting: Obstetrics & Gynecology

## 2016-01-13 ENCOUNTER — Ambulatory Visit (INDEPENDENT_AMBULATORY_CARE_PROVIDER_SITE_OTHER): Payer: Medicaid Other | Admitting: Obstetrics & Gynecology

## 2016-01-13 VITALS — BP 106/72 | HR 73 | Wt 231.0 lb

## 2016-01-13 DIAGNOSIS — Z3402 Encounter for supervision of normal first pregnancy, second trimester: Secondary | ICD-10-CM

## 2016-01-13 DIAGNOSIS — Z36 Encounter for antenatal screening of mother: Secondary | ICD-10-CM | POA: Diagnosis not present

## 2016-01-13 DIAGNOSIS — Z3482 Encounter for supervision of other normal pregnancy, second trimester: Secondary | ICD-10-CM

## 2016-01-13 NOTE — Progress Notes (Signed)
Subjective:  Michele Hayden is a 25 y.o. [id number] at 8490w0d being seen today for ongoing prenatal care.  She is currently monitored for the following issues for this high-risk pregnancy and has Wheezing; History of low vertical cesarean section; History of preterm delivery, currently pregnant; History of placental abruption; Upper airway cough syndrome; and Supervision of high risk pregnancy in third trimester on her problem list.  Patient reports no complaints.  Contractions: Not present. Vag. Bleeding: None.  Movement: Absent. Denies leaking of fluid.   The following portions of the patient's history were reviewed and updated as appropriate: allergies, current medications, past family history, past medical history, past social history, past surgical history and problem list. Problem list updated.  Objective:   Filed Vitals:   01/13/16 1013  BP: 106/72  Pulse: 73  Weight: 231 lb (104.781 kg)    Fetal Status: Fetal Heart Rate (bpm): 150 Fundal Height: 17 cm Movement: Absent     General:  Alert, oriented and cooperative. Patient is in no acute distress.  Skin: Skin is warm and dry. No rash noted.   Cardiovascular: Normal heart rate noted  Respiratory: Normal respiratory effort, no problems with respiration noted  Abdomen: Soft, gravid, appropriate for gestational age. Pain/Pressure: Absent     Pelvic:  Cervical exam deferred        Extremities: Normal range of motion.  Edema: None  Mental Status: Normal mood and affect. Normal behavior. Normal judgment and thought content.   Urinalysis:      Assessment and Plan:  Pregnancy: [id number] at 690w0d  There are no diagnoses linked to this encounter. Preterm labor symptoms and general obstetric precautions including but not limited to vaginal bleeding, contractions, leaking of fluid and fetal movement were reviewed in detail with the patient. Please refer to After Visit Summary for other counseling recommendations.  Return in about 4 weeks  (around 02/10/2016). US MFM and consult. Quad screen  Adam PhenixJames G Arnold, MD

## 2016-01-20 ENCOUNTER — Encounter (HOSPITAL_COMMUNITY): Payer: Self-pay | Admitting: Obstetrics & Gynecology

## 2016-01-20 LAB — AFP, QUAD SCREEN
AFP: 37.6 ng/mL
CURR GEST AGE: 17 wk
HCG, Total: 113.82 IU/mL
INH: 422.9 pg/mL
UE3 VALUE: 1.71 ng/mL

## 2016-01-27 ENCOUNTER — Other Ambulatory Visit: Payer: Self-pay | Admitting: Obstetrics & Gynecology

## 2016-01-27 ENCOUNTER — Ambulatory Visit (HOSPITAL_COMMUNITY)
Admission: RE | Admit: 2016-01-27 | Discharge: 2016-01-27 | Disposition: A | Discharge status: Home / Self Care | Payer: Medicaid Other | Source: Ambulatory Visit | Attending: Obstetrics & Gynecology | Admitting: Obstetrics & Gynecology

## 2016-01-27 DIAGNOSIS — Z3402 Encounter for supervision of normal first pregnancy, second trimester: Secondary | ICD-10-CM

## 2016-01-27 DIAGNOSIS — Z36 Encounter for antenatal screening of mother: Secondary | ICD-10-CM | POA: Insufficient documentation

## 2016-01-27 DIAGNOSIS — Z3A19 19 weeks gestation of pregnancy: Secondary | ICD-10-CM

## 2016-01-27 DIAGNOSIS — O09212 Supervision of pregnancy with history of pre-term labor, second trimester: Secondary | ICD-10-CM

## 2016-01-27 DIAGNOSIS — O34219 Maternal care for unspecified type scar from previous cesarean delivery: Secondary | ICD-10-CM | POA: Diagnosis not present

## 2016-01-27 DIAGNOSIS — Z3689 Encounter for other specified antenatal screening: Secondary | ICD-10-CM

## 2016-01-27 DIAGNOSIS — O09892 Supervision of other high risk pregnancies, second trimester: Secondary | ICD-10-CM

## 2016-01-27 DIAGNOSIS — O99212 Obesity complicating pregnancy, second trimester: Secondary | ICD-10-CM

## 2016-02-10 ENCOUNTER — Encounter: Payer: Self-pay | Admitting: Obstetrics & Gynecology

## 2016-02-10 ENCOUNTER — Ambulatory Visit (INDEPENDENT_AMBULATORY_CARE_PROVIDER_SITE_OTHER): Payer: Medicaid Other | Admitting: Obstetrics & Gynecology

## 2016-02-10 VITALS — BP 115/75 | HR 87 | Wt 232.0 lb

## 2016-02-10 DIAGNOSIS — O99612 Diseases of the digestive system complicating pregnancy, second trimester: Secondary | ICD-10-CM

## 2016-02-10 DIAGNOSIS — O09212 Supervision of pregnancy with history of pre-term labor, second trimester: Secondary | ICD-10-CM

## 2016-02-10 DIAGNOSIS — K219 Gastro-esophageal reflux disease without esophagitis: Secondary | ICD-10-CM

## 2016-02-10 DIAGNOSIS — O219 Vomiting of pregnancy, unspecified: Secondary | ICD-10-CM | POA: Diagnosis not present

## 2016-02-10 DIAGNOSIS — O0993 Supervision of high risk pregnancy, unspecified, third trimester: Secondary | ICD-10-CM | POA: Diagnosis not present

## 2016-02-10 DIAGNOSIS — O09892 Supervision of other high risk pregnancies, second trimester: Secondary | ICD-10-CM

## 2016-02-10 DIAGNOSIS — Z98891 History of uterine scar from previous surgery: Secondary | ICD-10-CM

## 2016-02-10 MED ORDER — FAMOTIDINE 20 MG PO TABS: 20.0000 mg | ORAL_TABLET | Freq: Two times a day (BID) | ORAL | 3 refills | Status: DC

## 2016-02-10 MED ORDER — PROGESTERONE MICRONIZED 200 MG PO CAPS: ORAL_CAPSULE | ORAL | 3 refills | Status: DC

## 2016-02-10 MED ORDER — ONDANSETRON 4 MG PO TBDP: 4.0000 mg | ORAL_TABLET | Freq: Four times a day (QID) | ORAL | 0 refills | Status: DC | PRN

## 2016-02-10 NOTE — Progress Notes (Signed)
Subjective:  Michele Hayden is a 25 y.o. [id number] at 5837w0d being seen today for ongoing prenatal care.  She is currently monitored for the following issues for this high-risk pregnancy and has Wheezing; History of low vertical cesarean section; History of preterm delivery, currently pregnant; History of placental abruption; Upper airway cough syndrome; and Supervision of high risk pregnancy in third trimester on her problem list.  Patient reports heartburn, nausea and vomiting.  Contractions: Not present. Vag. Bleeding: None.  Movement: Present. Denies leaking of fluid.   The following portions of the patient's history were reviewed and updated as appropriate: allergies, current medications, past family history, past medical history, past social history, past surgical history and problem list. Problem list updated.  Objective:   Vitals:   02/10/16 1010  BP: 115/75  Pulse: 87  Weight: 232 lb (105.2 kg)    Fetal Status: Fetal Heart Rate (bpm): 145 Fundal Height: 21 cm Movement: Present     General:  Alert, oriented and cooperative. Patient is in no acute distress.  Skin: Skin is warm and dry. No rash noted.   Cardiovascular: Normal heart rate noted  Respiratory: Normal respiratory effort, no problems with respiration noted  Abdomen: Soft, gravid, appropriate for gestational age. Pain/Pressure: Absent     Pelvic:  Cervical exam deferred        Extremities: Normal range of motion.  Edema: None  Mental Status: Normal mood and affect. Normal behavior. Normal judgment and thought content.   Urinalysis: Urine Protein: Trace Urine Glucose: Negative  Assessment and Plan:  Pregnancy: Y7W2956G5P1121 at 3337w0d  1. History of preterm delivery, currently pregnant, second trimester Went into PTL at 4268w3d; delivered on 7020w5d.  Recommended 17P weekly.  [facility] Will use progesterone PV in the interim. Last cervical length on  01/27/2016 scan was 3.5 cm; normal fetal anatomy. -  progesterone (PROMETRIUM) 200 MG capsule; Place one capsule vaginally at bedtime  Dispense: 30 capsule; Refill: 3  2. Nausea/vomiting in pregnancy Zofran prescribed - ondansetron (ZOFRAN ODT) 4 MG disintegrating tablet; Take 1 tablet (4 mg total) by mouth every 6 (six) hours as needed for nausea.  Dispense: 20 tablet; Refill: 0  3. Gastroesophageal reflux during pregnancy in second trimester, antepartum Pepcid ordered - famotidine (PEPCID) 20 MG tablet; Take 1 tablet (20 mg total) by mouth 2 (two) times daily.  Dispense: 60 tablet; Refill: 3  4. History of low vertical cesarean section Can still TOLAC; patient has one successful SVD in 2015.  Discussed risks of uterine rupture, was given TOLAC information to review at home, will make decision by next visit.  5. Supervision of high risk pregnancy in third trimester Normal anatomy scan. Declines repeat quad screen today (last sample was cancelled). Preterm labor symptoms and general obstetric precautions including but not limited to vaginal bleeding, contractions, leaking of fluid and fetal movement were reviewed in detail with the patient. Please refer to After Visit Summary for other counseling recommendations.  Return in about 4 weeks (around 03/09/2016) for OB Visit.   Tereso NewcomerUgonna A Marquette Piontek, MD

## 2016-02-10 NOTE — Patient Instructions (Addendum)
Return to clinic for any scheduled appointments or obstetric concerns, or go to MAU for evaluation  Trial of Labor After Cesarean Delivery Information A trial of labor after cesarean delivery (TOLAC) is when a woman tries to give birth vaginally after a previous cesarean delivery. TOLAC may be a safe and appropriate option for you depending on your medical history and other risk factors. When TOLAC is successful and you are able to have a vaginal delivery, this is called a vaginal birth after cesarean delivery (VBAC).  CANDIDATES FOR TOLAC TOLAC is possible for some women who:  Have undergone one or two prior cesarean deliveries in which the incision of the uterus was horizontal (low transverse).  Are carrying twins and have had one prior low transverse incision during a cesarean delivery.  Do not have a vertical (classical) uterine scar.  Have not had a tear in the wall of their uterus (uterine rupture). TOLAC is also supported for women who meet appropriate criteria and:  Are under the age of 40 years.  Are tall and have a body mass index (BMI) of less than 30.  Have an unknown uterine scar.  Give birth in a facility equipped to handle an emergency cesarean delivery. This team should be able to handle possible complications such as a uterine rupture.  Have thorough counseling about the benefits and risks of TOLAC.  Have discussed future pregnancy plans with their health care provider.  Plan to have several more pregnancies. MOST SUCCESSFUL CANDIDATES FOR TOLAC:  Have had a successful vaginal delivery before or after their cesarean delivery.  Experience labor that begins naturally on or before the due date (40 weeks of gestation).  Do not have a very large (macrosomic) baby.   Had a prior cesarean delivery but are not currently experiencing factors that would prompt a cesarean delivery (such as a breech position).  Had only one prior cesarean delivery.  Had a prior  cesarean delivery that was performed early in labor and not after full cervical dilation. TOLAC may be most appropriate for women who meet the above guidelines and who plan to have more pregnancies. TOLAC is not recommended for home births. LEAST SUCCESSFUL CANDIDATES FOR TOLAC:  Have an induced labor with an unfavorable cervix. An unfavorable cervix is when the cervix is not dilating enough (among other factors).  Have never had a vaginal delivery.  Have had more than two cesarean deliveries.  Have a pregnancy at more than 40 weeks of gestation.  Are pregnant with a baby with a suspected weight greater than 4,000 grams (8 pounds) and who have no prior history of a vaginal delivery.  Have closely spaced pregnancies. SUGGESTED BENEFITS OF TOLAC  You may have a faster recovery time.  You may have a shorter stay in the hospital.  You may have less pain and fewer problems than with a cesarean delivery. Women who have a cesarean delivery have a higher chance of needing blood or getting a fever, an infection, or a blood clot in the legs. SUGGESTED RISKS OF TOLAC The highest risk of complications happens to women who attempt a TOLAC and fail. A failed TOLAC results in an unplanned cesarean delivery. Risks related to TOLAC or repeat cesarean deliveries include:   Blood loss.  Infection.  Blood clot.  Injury to surrounding tissues or organs.  Having to remove the uterus (hysterectomy).  Potential problems with the placenta (such as placenta previa or placenta accreta) in future pregnancies. Although very rare, the main concerns   with TOLAC are:  Rupture of the uterine scar from a past cesarean delivery.  Needing an emergency cesarean delivery.  Having a bad outcome for the baby (perinatal morbidity). FOR MORE INFORMATION American Congress of Obstetricians and Gynecologists: www.acog.org American College of Nurse-Midwives: www.midwife.org   This information is not intended to  replace advice given to you by your health care provider. Make sure you discuss any questions you have with your health care provider.   Document Released: 03/02/2011 Document Revised: 04/04/2013 Document Reviewed: 12/04/2012 Elsevier Interactive Patient Education 2016 Elsevier Inc.  

## 2016-02-14 ENCOUNTER — Encounter (HOSPITAL_COMMUNITY): Payer: Self-pay | Admitting: *Deleted

## 2016-02-14 ENCOUNTER — Inpatient Hospital Stay (HOSPITAL_COMMUNITY)
Admission: AD | Admit: 2016-02-14 | Discharge: 2016-02-14 | Disposition: A | Discharge status: Home / Self Care | Payer: Medicaid Other | Source: Ambulatory Visit | Attending: Obstetrics and Gynecology | Admitting: Obstetrics and Gynecology

## 2016-02-14 ENCOUNTER — Telehealth: Payer: Self-pay | Admitting: Nurse Practitioner

## 2016-02-14 DIAGNOSIS — M542 Cervicalgia: Secondary | ICD-10-CM | POA: Diagnosis not present

## 2016-02-14 DIAGNOSIS — O26892 Other specified pregnancy related conditions, second trimester: Secondary | ICD-10-CM | POA: Insufficient documentation

## 2016-02-14 DIAGNOSIS — Z3A21 21 weeks gestation of pregnancy: Secondary | ICD-10-CM | POA: Insufficient documentation

## 2016-02-14 DIAGNOSIS — Z833 Family history of diabetes mellitus: Secondary | ICD-10-CM | POA: Insufficient documentation

## 2016-02-14 DIAGNOSIS — Z841 Family history of disorders of kidney and ureter: Secondary | ICD-10-CM | POA: Insufficient documentation

## 2016-02-14 DIAGNOSIS — K219 Gastro-esophageal reflux disease without esophagitis: Secondary | ICD-10-CM | POA: Diagnosis not present

## 2016-02-14 DIAGNOSIS — Z825 Family history of asthma and other chronic lower respiratory diseases: Secondary | ICD-10-CM | POA: Diagnosis not present

## 2016-02-14 DIAGNOSIS — Z87891 Personal history of nicotine dependence: Secondary | ICD-10-CM | POA: Insufficient documentation

## 2016-02-14 DIAGNOSIS — Z8249 Family history of ischemic heart disease and other diseases of the circulatory system: Secondary | ICD-10-CM | POA: Diagnosis not present

## 2016-02-14 DIAGNOSIS — O98312 Other infections with a predominantly sexual mode of transmission complicating pregnancy, second trimester: Secondary | ICD-10-CM | POA: Insufficient documentation

## 2016-02-14 DIAGNOSIS — J029 Acute pharyngitis, unspecified: Secondary | ICD-10-CM | POA: Diagnosis not present

## 2016-02-14 LAB — URINALYSIS, ROUTINE W REFLEX MICROSCOPIC
Bilirubin Urine: NEGATIVE
Glucose, UA: NEGATIVE mg/dL
Hgb urine dipstick: NEGATIVE
Ketones, ur: NEGATIVE mg/dL
LEUKOCYTES UA: NEGATIVE
NITRITE: NEGATIVE
Protein, ur: NEGATIVE mg/dL
SPECIFIC GRAVITY, URINE: 1.025 (ref 1.005–1.030)
pH: 6 (ref 5.0–8.0)

## 2016-02-14 LAB — RAPID STREP SCREEN (MED CTR MEBANE ONLY): STREPTOCOCCUS, GROUP A SCREEN (DIRECT): POSITIVE — AB

## 2016-02-14 MED ORDER — PENICILLIN V POTASSIUM 500 MG PO TABS: 500.0000 mg | ORAL_TABLET | Freq: Two times a day (BID) | ORAL | 0 refills | Status: AC

## 2016-02-14 MED ORDER — TERCONAZOLE 0.4 % VA CREA: 1.0000 | TOPICAL_CREAM | Freq: Every day | VAGINAL | 0 refills | Status: DC

## 2016-02-14 NOTE — Telephone Encounter (Signed)
Reviewed strep test results - was positive.  Called client and gave her this information.  Eprescribed Penicillin V 500 mg PO BID x 10 days and terazol vaginal cream to use if she develops a vaginal yeast infection due to the antibiotic use.  Client agrees to pick up her medication.  Nolene Bernheimerri Abagayle Klutts, NP

## 2016-02-14 NOTE — MAU Note (Signed)
Patient presents with sore throat x 3 days getting worse, denies fever or chills.

## 2016-02-14 NOTE — MAU Provider Note (Signed)
History     CSN: [id number]  Arrival date and time: 02/14/16 [id number]   First Provider Initiated Contact with Patient 02/14/16 [phone]      Chief Complaint  Patient presents with  . Sore Throat   HPI Michele Hayden 25 y.o. 7031w4d  [facility].  Comes to [facility]U with a sore throat that she has had for 3 days.  Is getting worse and is her throat is very sore and she does not want to swallow.  Has pain in her neck and upper shoulders.  Also has nasal stuffiness and feels drainage going down her throat.  Wonders if she has strep throat.  Denies any fever and has not taken any medication for pain or for the stuffiness.   OB History    Gravida Para Term Preterm AB Living   5 2 1 1 2 1    SAB TAB Ectopic Multiple Live Births   1 1     2       Past Medical History:  Diagnosis Date  . Chlamydia infection complicating pregnancy in second trimester 04/10/2013  . GERD (gastroesophageal reflux disease)     Past Surgical History:  Procedure Laterality Date  . CESAREAN SECTION N/A 04/13/2014   Procedure: CESAREAN SECTION;  Surgeon: Tilda BurrowJohn Ferguson V, MD;  Location: [facility];  Service: Obstetrics;  Laterality: N/A;  . DILATION AND CURETTAGE OF UTERUS    . WISDOM TOOTH EXTRACTION      Family History  Problem Relation Age of Onset  . Asthma Brother   . Diabetes Brother   . Diabetes Paternal Grandmother   . Kidney disease Paternal Grandmother   . Heart disease Paternal Grandmother   . Heart attack Paternal Grandmother   . Diabetes Maternal Aunt     Social History  Substance Use Topics  . Smoking status: Former Smoker    Packs/day: 0.50    Years: 7.00    Types: Cigarettes    Quit date: 06/28/2014  . Smokeless tobacco: Former NeurosurgeonUser  . Alcohol use 0.0 oz/week     Comment: occasional    Allergies: No Known Allergies  Prescriptions Prior to Admission  Medication Sig Dispense Refill Last Dose  . famotidine (PEPCID) 20 MG tablet Take 1 tablet (20 mg total) by mouth 2 (two)  times daily. 60 tablet 3   . ondansetron (ZOFRAN ODT) 4 MG disintegrating tablet Take 1 tablet (4 mg total) by mouth every 6 (six) hours as needed for nausea. 20 tablet 0   . prenatal vitamin w/FE, FA (NATACHEW) 29-1 MG CHEW chewable tablet Chew 1 tablet by mouth daily at 12 noon.   Taking  . progesterone (PROMETRIUM) 200 MG capsule Place one capsule vaginally at bedtime 30 capsule 3     Review of Systems  Constitutional: Negative for chills and fever.  HENT: Positive for congestion and sore throat. Negative for ear pain.   Gastrointestinal: Negative.   Genitourinary: Negative.   Neurological: Negative for headaches.   Physical Exam   Blood pressure 128/67, pulse 87, temperature 98.6 F (37 C), temperature source Oral, resp. rate 18, last menstrual period 09/16/2015, unknown if currently breastfeeding.  Physical Exam  Nursing note and vitals reviewed. Constitutional: She is oriented to person, place, and time. She appears well-developed and well-nourished.  HENT:  Head: Normocephalic.  Mouth/Throat: No oropharyngeal exudate.  Pharynx is pink but not red.  Boggy nasal turbinates. No redness in ear canals, lots of wax in right ear and unable to see TM.  Eyes: EOM are normal. Pupils are equal, round, and reactive to light. Left eye exhibits no discharge.  Neck: Normal range of motion. Neck supple.  Some tenderness in her neck.  One lymph node enlarged.  Cardiovascular: Normal rate.   Respiratory: Effort normal. No stridor.  Musculoskeletal: Normal range of motion.  Neurological: She is alert and oriented to person, place, and time.  Skin: Skin is warm and dry.  Psychiatric: She has a normal mood and affect.    MAU Course  Procedures Rapid strep done but results not yet available.    MDM Clinical condition suggests viral pharyngitis - no fever, no exudate, no headache, even though sore throat itself is severe to client.  Likely sore throat is due to postnasal drip.  Strep test  done but do not expect result to be positive but will watch for results.  Discussed comfort measures for sore throat and will give written info to client.   Assessment and Plan  Pharyngitis  Plan Tylenol per the package directions Warm saline gargles Warm tea  Over the counter zyrtec or claritin as antihistamine to decrease postnasal drip Keep all appointments as scheduled. Call your doctor for worsening symptoms.  Michele Hayden L Latasha Buczkowski 02/14/2016, 8:27 AM

## 2016-02-14 NOTE — Discharge Instructions (Signed)
Your sore throat is likely being caused by postnasal drip.  You can use tylenol by the package directions and Zyrtec or claritin by the package directions..  Call your doctor if your symptoms worsen.  We will call you if you need antibiotics once the strep test is resulted.

## 2016-02-23 IMAGING — US US OB COMP LESS 14 WK
1 series · 14 of 28 positions shown · non-contrast
Comparison: 04/11/2014

CLINICAL DATA: Abdominal pain affecting pregnancy. Vaginal
bleeding. Bleeding started this morning. Quantitative beta HCG is
[DATE]. LMP 09/10/2014. Gestational age by LMP is 4 weeks 0 days. EDC
by LMP is 06/17/2015. Gravida 4 para 1 AB1.

EXAM:
OBSTETRIC <14 WK US AND TRANSVAGINAL OB US
TECHNIQUE: Both transabdominal and transvaginal ultrasound examinations were
performed for complete evaluation of the gestation as well as the
maternal uterus, adnexal regions, and pelvic cul-de-sac.
Transvaginal technique was performed to assess early pregnancy.

[Series 1: us ob transvaginal · 14 of 35 slices shown]
[im 2/35]
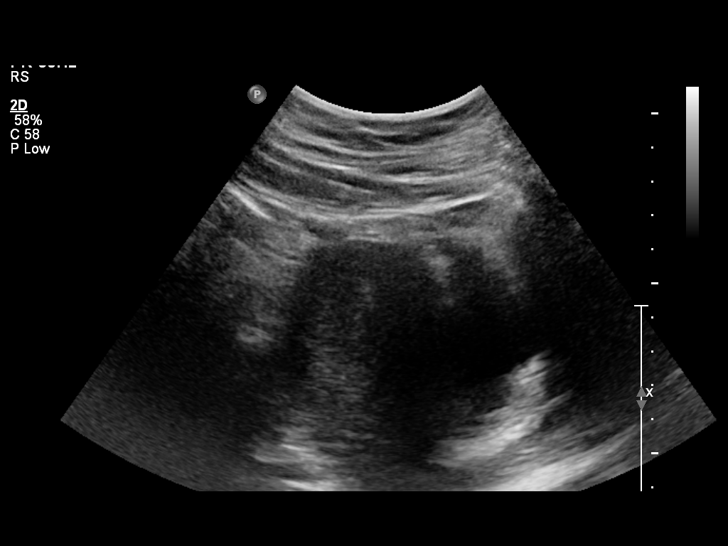
[im 4/35]
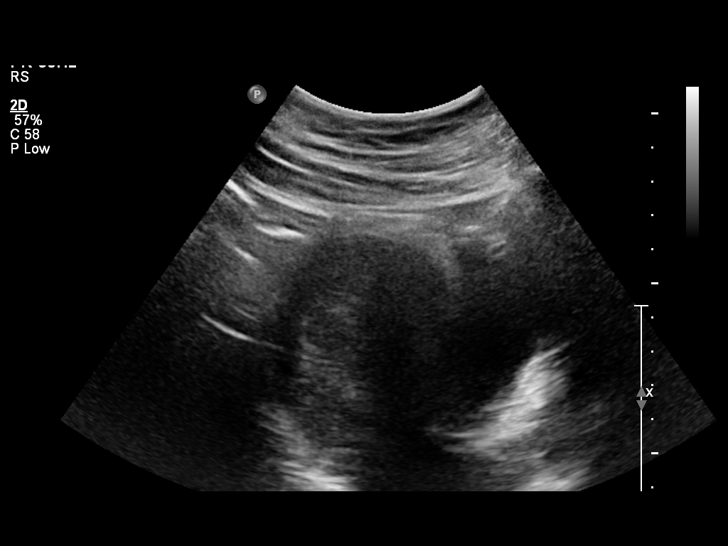
[im 7/35]
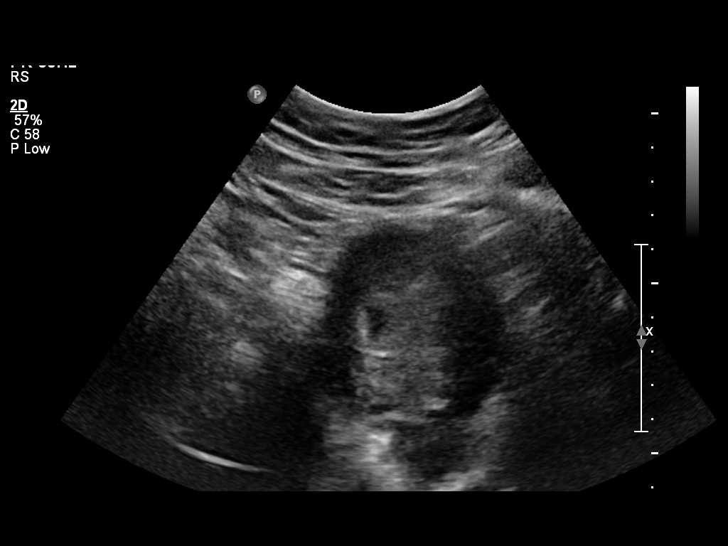
[im 9/35]
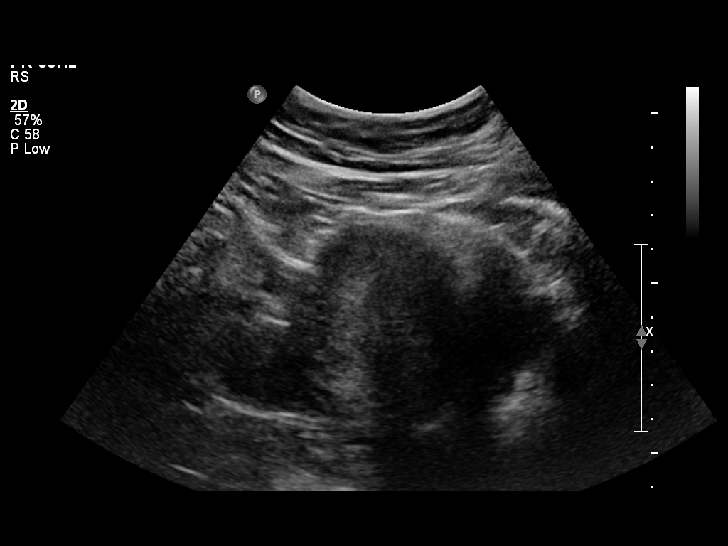
[im 12/35]
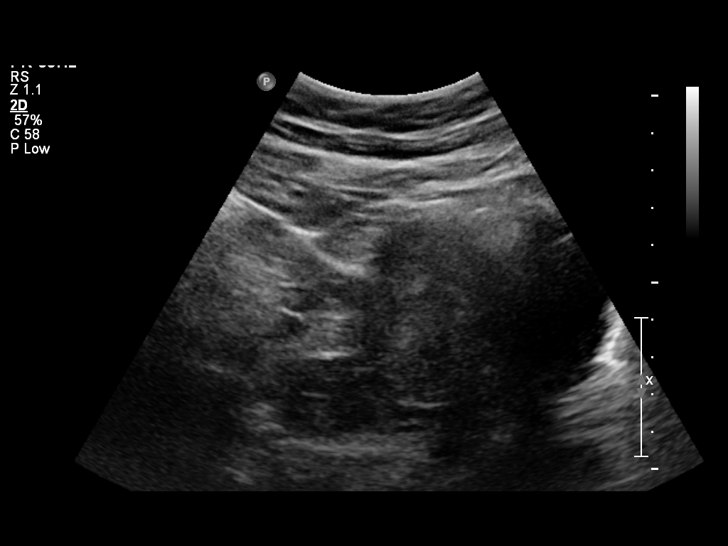
[im 14/35]
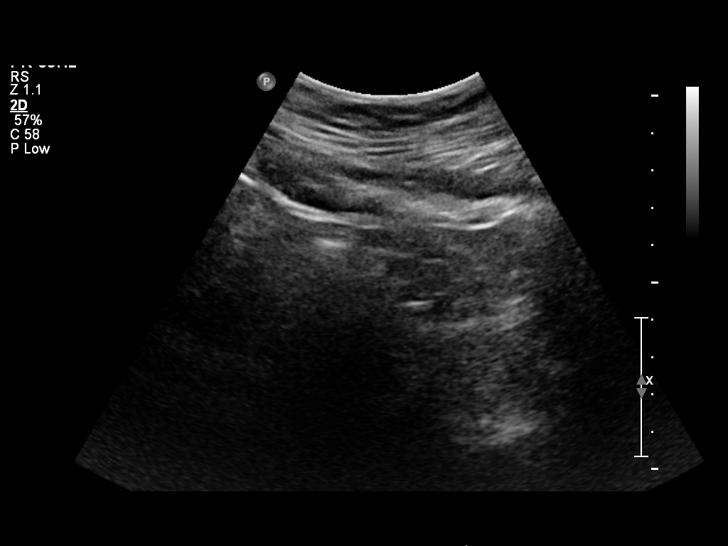
[im 17/35]
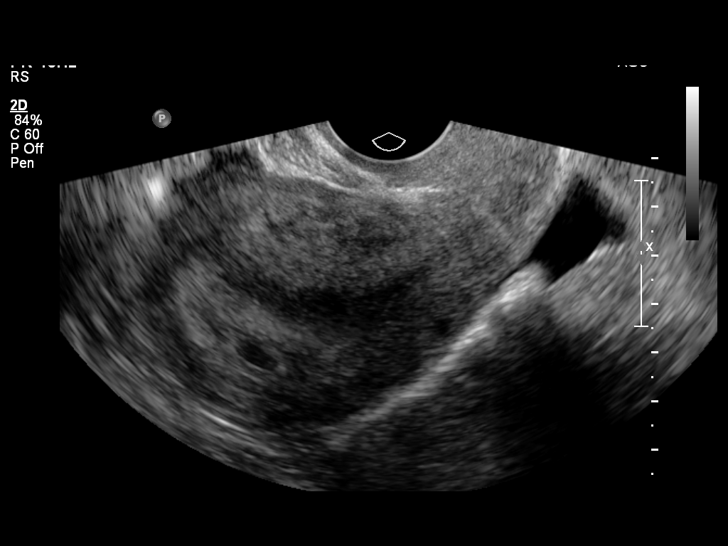
[im 19/35]
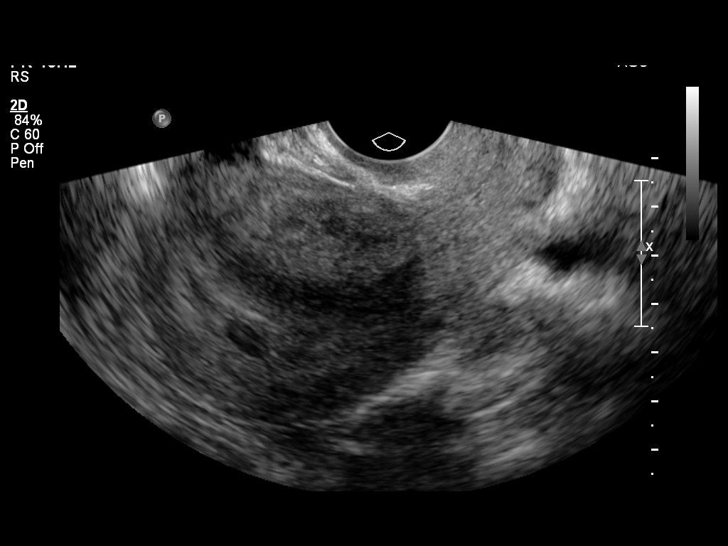
[im 22/35]
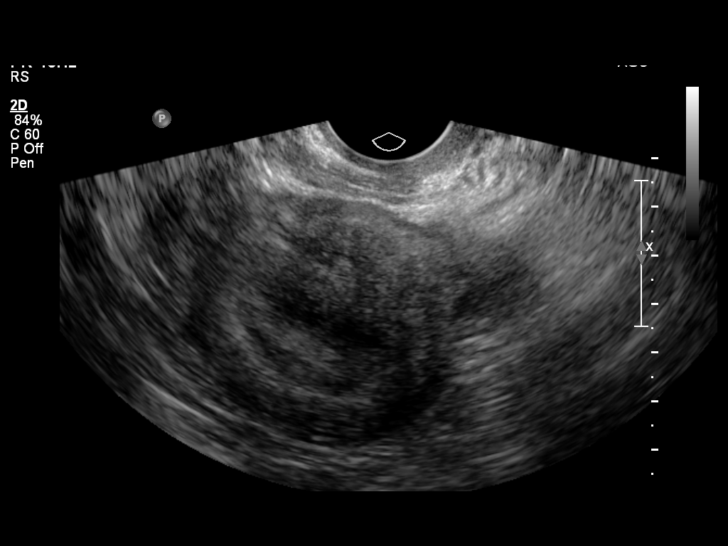
[im 24/35]
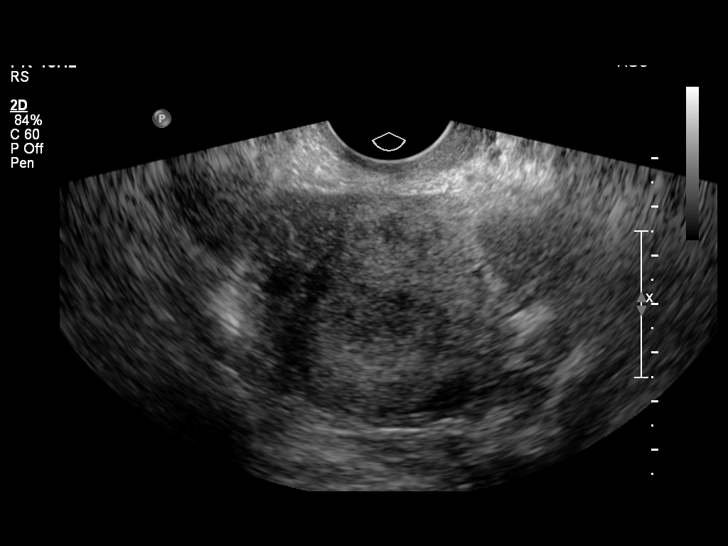
[im 27/35]
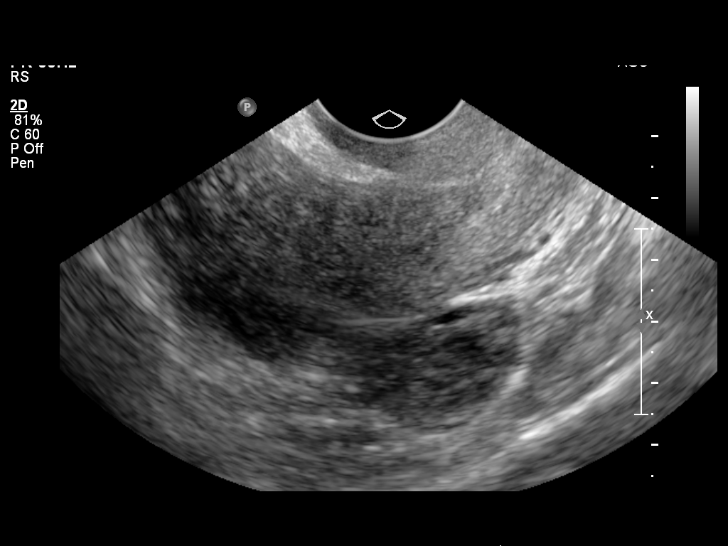
[im 29/35]
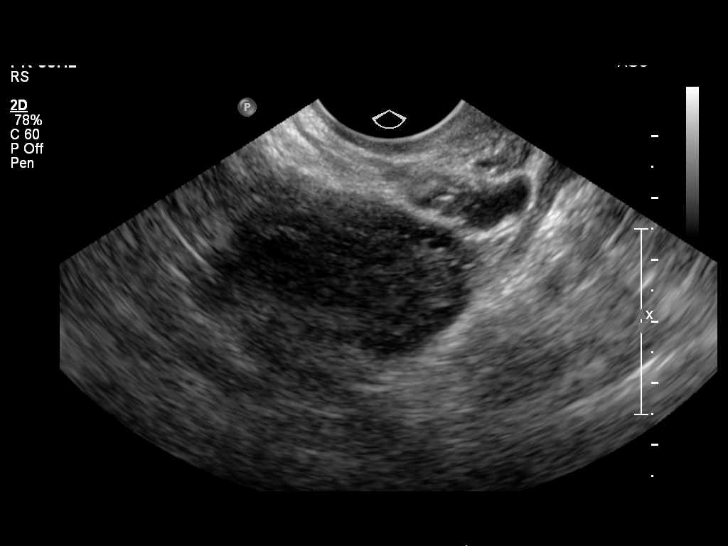
[im 32/35]
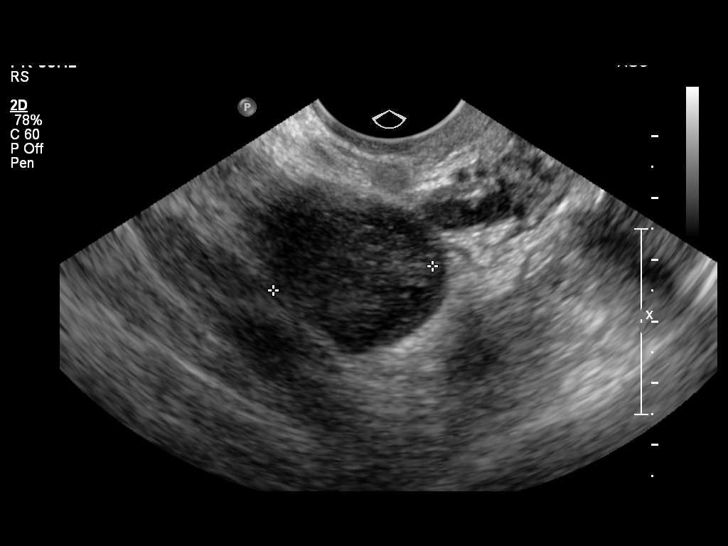
[im 35/35]
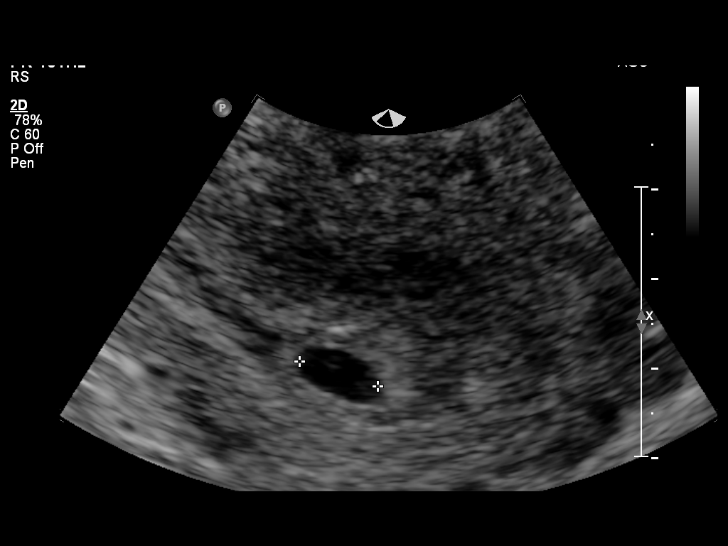

[14 of 28 positions shown; findings below may reference images not displayed]

FINDINGS: Intrauterine gestational sac: Visualized

Yolk sac:  Possible

Embryo:  Not seen

Cardiac Activity: Not seen

MSD: 8.0  mm   5 w   3  d

Maternal uterus/adnexae: Ovaries have a normal appearance. No
subchorionic hemorrhage. Small amount of free pelvic fluid noted.
IMPRESSION: 1. Intrauterine gestational sac.
2. Possible yolk sac but no definite embryo identified.
3. Follow-up ultrasound is recommended in 14 days to document
presence of fetal pole and for dating purposes.

## 2016-03-02 ENCOUNTER — Encounter: Payer: Self-pay | Admitting: *Deleted

## 2016-03-09 ENCOUNTER — Ambulatory Visit (INDEPENDENT_AMBULATORY_CARE_PROVIDER_SITE_OTHER): Payer: Medicaid Other | Admitting: Obstetrics & Gynecology

## 2016-03-09 VITALS — BP 117/74 | HR 83 | Wt 236.0 lb

## 2016-03-09 DIAGNOSIS — O34219 Maternal care for unspecified type scar from previous cesarean delivery: Secondary | ICD-10-CM

## 2016-03-09 DIAGNOSIS — O09892 Supervision of other high risk pregnancies, second trimester: Secondary | ICD-10-CM

## 2016-03-09 DIAGNOSIS — O0992 Supervision of high risk pregnancy, unspecified, second trimester: Secondary | ICD-10-CM

## 2016-03-09 DIAGNOSIS — Z98891 History of uterine scar from previous surgery: Secondary | ICD-10-CM

## 2016-03-09 DIAGNOSIS — O09212 Supervision of pregnancy with history of pre-term labor, second trimester: Secondary | ICD-10-CM | POA: Diagnosis not present

## 2016-03-09 MED ORDER — PROGESTERONE MICRONIZED 200 MG PO CAPS: 200.0000 mg | ORAL_CAPSULE | Freq: Two times a day (BID) | ORAL | 3 refills | Status: DC

## 2016-03-09 NOTE — Progress Notes (Signed)
   PRENATAL VISIT NOTE  Subjective:  Michele Hayden is a 25 y.o. [id number] at 1277w0d being seen today for ongoing prenatal care.  She is currently monitored for the following issues for this high-risk pregnancy and has Wheezing; History of low vertical cesarean section; History of preterm delivery, currently pregnant; History of placental abruption; Upper airway cough syndrome; and Supervision of high risk pregnancy in third trimester on her problem list.  Patient reports no complaints.  Contractions: Not present. Vag. Bleeding: None.  Movement: Present. Denies leaking of fluid.   The following portions of the patient's history were reviewed and updated as appropriate: allergies, current medications, past family history, past medical history, past social history, past surgical history and problem list. Problem list updated.  Objective:   Vitals:   03/09/16 0939  BP: 117/74  Pulse: 83  Weight: 236 lb (107 kg)    Fetal Status: Fetal Heart Rate (bpm): 136 Fundal Height: 27 cm Movement: Present     General:  Alert, oriented and cooperative. Patient is in no acute distress.  Skin: Skin is warm and dry. No rash noted.   Cardiovascular: Normal heart rate noted  Respiratory: Normal respiratory effort, no problems with respiration noted  Abdomen: Soft, gravid, appropriate for gestational age. Pain/Pressure: Present     Pelvic:  Cervical exam deferred        Extremities: Normal range of motion.  Edema: None  Mental Status: Normal mood and affect. Normal behavior. Normal judgment and thought content.   Urinalysis: Urine Protein: Trace Urine Glucose: Negative  Assessment and Plan:  Pregnancy: [id number] at 1777w0d  1. History of low vertical cesarean section Has decided to proceed with TOLAC. Consent signed  2. History of preterm delivery, currently pregnant, second trimester Did not qualify for Makena as PTD due to placental abruption.  Patient did not want to take Prometrium vaginally.  Prescribed Prometrium 200 mg po bid.  - progesterone (PROMETRIUM) 200 MG capsule; Take 1 capsule (200 mg total) by mouth 2 (two) times daily.  Dispense: 60 capsule; Refill: 3  3. Supervision of high risk pregnancy in second trimester Will do third trimester labs soon, at outside lab facility. Requisition given. - CBC - RPR - HIV antibody - Glucose Tolerance, 2 Hours w/1 Hour Preterm labor symptoms and general obstetric precautions including but not limited to vaginal bleeding, contractions, leaking of fluid and fetal movement were reviewed in detail with the patient. Please refer to After Visit Summary for other counseling recommendations.  Return in about 3 weeks (around 03/30/2016) for OB Visit, TDap. Also possible FLu vaccine, declined today. Was given information about both vaccinations.  Tereso NewcomerUgonna A Taeja Debellis, MD

## 2016-03-09 NOTE — Patient Instructions (Addendum)
Return to clinic for any scheduled appointments or obstetric concerns, or go to [facility] for evaluation  Tdap Vaccine (Tetanus, Diphtheria and Pertussis): What You Need to Know 1. Why get vaccinated? Tetanus, diphtheria and pertussis are very serious diseases. Tdap vaccine can protect us from these diseases. And, Tdap vaccine given to pregnant women can protect newborn babies against pertussis. TETANUS (Lockjaw) is rare in the [place] today. It causes painful muscle tightening and stiffness, usually all over the body.  It can lead to tightening of muscles in the head and neck so you can't open your mouth, swallow, or sometimes even breathe. Tetanus kills about 1 out of 10 people who are infected even after receiving the best medical care. DIPHTHERIA is also rare in the [place] today. It can cause a thick coating to form in the back of the throat.  It can lead to breathing problems, heart failure, paralysis, and death. PERTUSSIS (Whooping Cough) causes severe coughing spells, which can cause difficulty breathing, vomiting and disturbed sleep.  It can also lead to weight loss, incontinence, and rib fractures. Up to 2 in 100 adolescents and 5 in 100 adults with pertussis are hospitalized or have complications, which could include pneumonia or death. These diseases are caused by bacteria. Diphtheria and pertussis are spread from person to person through secretions from coughing or sneezing. Tetanus enters the body through cuts, scratches, or wounds. Before vaccines, as many as 200,000 cases of diphtheria, 200,000 cases of pertussis, and hundreds of cases of tetanus, were reported in the [place] each year. Since vaccination began, reports of cases for tetanus and diphtheria have dropped by about 99% and for pertussis by about 80%. 2. Tdap vaccine Tdap vaccine can protect adolescents and adults from tetanus, diphtheria, and pertussis. One dose of Tdap is routinely given at age 11 or 12.  People who did not get Tdap at that age should get it as soon as possible. Tdap is especially important for healthcare professionals and anyone having close contact with a baby younger than 12 months. Pregnant women should get a dose of Tdap during every pregnancy, to protect the newborn from pertussis. Infants are most at risk for severe, life-threatening complications from pertussis. Another vaccine, called Td, protects against tetanus and diphtheria, but not pertussis. A Td booster should be given every 10 years. Tdap may be given as one of these boosters if you have never gotten Tdap before. Tdap may also be given after a severe cut or burn to prevent tetanus infection. Your doctor or the person giving you the vaccine can give you more information. Tdap may safely be given at the same time as other vaccines. 3. Some people should not get this vaccine  A person who has ever had a life-threatening allergic reaction after a previous dose of any diphtheria, tetanus or pertussis containing vaccine, OR has a severe allergy to any part of this vaccine, should not get Tdap vaccine. Tell the person giving the vaccine about any severe allergies.  Anyone who had coma or long repeated seizures within 7 days after a childhood dose of DTP or DTaP, or a previous dose of Tdap, should not get Tdap, unless a cause other than the vaccine was found. They can still get Td.  Talk to your doctor if you:  have seizures or another nervous system problem,  had severe pain or swelling after any vaccine containing diphtheria, tetanus or pertussis,  ever had a condition called Guillain-Barr Syndrome (GBS),  aren't feeling   well on the day the shot is scheduled. 4. Risks With any medicine, including vaccines, there is a chance of side effects. These are usually mild and go away on their own. Serious reactions are also possible but are rare. Most people who get Tdap vaccine do not have any problems with it. Mild  problems following Tdap (Did not interfere with activities)  Pain where the shot was given (about 3 in 4 adolescents or 2 in 3 adults)  Redness or swelling where the shot was given (about 1 person in 5)  Mild fever of at least 100.4F (up to about 1 in 25 adolescents or 1 in 100 adults)  Headache (about 3 or 4 people in 10)  Tiredness (about 1 person in 3 or 4)  Nausea, vomiting, diarrhea, stomach ache (up to 1 in 4 adolescents or 1 in 10 adults)  Chills, sore joints (about 1 person in 10)  Body aches (about 1 person in 3 or 4)  Rash, swollen glands (uncommon) Moderate problems following Tdap (Interfered with activities, but did not require medical attention)  Pain where the shot was given (up to 1 in 5 or 6)  Redness or swelling where the shot was given (up to about 1 in 16 adolescents or 1 in 12 adults)  Fever over 102F (about 1 in 100 adolescents or 1 in 250 adults)  Headache (about 1 in 7 adolescents or 1 in 10 adults)  Nausea, vomiting, diarrhea, stomach ache (up to 1 or 3 people in 100)  Swelling of the entire arm where the shot was given (up to about 1 in 500). Severe problems following Tdap (Unable to perform usual activities; required medical attention)  Swelling, severe pain, bleeding and redness in the arm where the shot was given (rare). Problems that could happen after any vaccine:  People sometimes faint after a medical procedure, including vaccination. Sitting or lying down for about 15 minutes can help prevent fainting, and injuries caused by a fall. Tell your doctor if you feel dizzy, or have vision changes or ringing in the ears.  Some people get severe pain in the shoulder and have difficulty moving the arm where a shot was given. This happens very rarely.  Any medication can cause a severe allergic reaction. Such reactions from a vaccine are very rare, estimated at fewer than 1 in a million doses, and would happen within a few minutes to a few hours  after the vaccination. As with any medicine, there is a very remote chance of a vaccine causing a serious injury or death. The safety of vaccines is always being monitored. For more information, visit: www.cdc.gov/vaccinesafety/ 5. What if there is a serious problem? What should I look for?  Look for anything that concerns you, such as signs of a severe allergic reaction, very high fever, or unusual behavior.  Signs of a severe allergic reaction can include hives, swelling of the face and throat, difficulty breathing, a fast heartbeat, dizziness, and weakness. These would usually start a few minutes to a few hours after the vaccination. What should I do?  If you think it is a severe allergic reaction or other emergency that can't wait, call 9-1-1 or get the person to the nearest hospital. Otherwise, call your doctor.  Afterward, the reaction should be reported to the Vaccine Adverse Event Reporting System (VAERS). Your doctor might file this report, or you can do it yourself through the VAERS web site at www.vaers.hhs.gov, or by calling 1-800-822-7967. VAERS does not   give medical advice.  6. The National Vaccine Injury Compensation Program The [employer] Vaccine Injury Compensation Program (VICP) is a federal program that was created to compensate people who may have been injured by certain vaccines. Persons who believe they may have been injured by a vaccine can learn about the program and about filing a claim by calling 1-754-573-4829 or visiting the VICP website at SpiritualWord.at. There is a time limit to file a claim for compensation. 7. How can I learn more?  Ask your doctor. He or she can give you the vaccine package insert or suggest other sources of information.  Call your local or state health department.  Contact the Centers for Disease Control and Prevention (CDC):  Call 919-297-9758 (1-800-CDC-INFO) or  Visit CDC's website at PicCapture.uy CDC Tdap  Vaccine VIS (08/21/13)   This information is not intended to replace advice given to you by your health care provider. Make sure you discuss any questions you have with your health care provider.   Document Released: 12/14/2011 Document Revised: 07/05/2014 Document Reviewed: 09/26/2013 Elsevier Interactive Patient Education 2016 [employer].   Contraception Choices Contraception (birth control) is the use of any methods or devices to prevent pregnancy. Below are some methods to help avoid pregnancy. HORMONAL METHODS   Contraceptive implant. This is a thin, plastic tube containing progesterone hormone. It does not contain estrogen hormone. Your health care provider inserts the tube in the inner part of the upper arm. The tube can remain in place for up to 3 years. After 3 years, the implant must be removed. The implant prevents the ovaries from releasing an egg (ovulation), thickens the cervical mucus to prevent sperm from entering the uterus, and thins the lining of the inside of the uterus.  Progesterone-only injections. These injections are given every 3 months by your health care provider to prevent pregnancy. This synthetic progesterone hormone stops the ovaries from releasing eggs. It also thickens cervical mucus and changes the uterine lining. This makes it harder for sperm to survive in the uterus.  Birth control pills. These pills contain estrogen and progesterone hormone. They work by preventing the ovaries from releasing eggs (ovulation). They also cause the cervical mucus to thicken, preventing the sperm from entering the uterus. Birth control pills are prescribed by a health care provider.Birth control pills can also be used to treat heavy periods.  Minipill. This type of birth control pill contains only the progesterone hormone. They are taken every day of each month and must be prescribed by your health care provider.  Birth control patch. The patch contains hormones similar to  those in birth control pills. It must be changed once a week and is prescribed by a health care provider.  Vaginal ring. The ring contains hormones similar to those in birth control pills. It is left in the vagina for 3 weeks, removed for 1 week, and then a new one is put back in place. The patient must be comfortable inserting and removing the ring from the vagina.A health care provider's prescription is necessary.  Emergency contraception. Emergency contraceptives prevent pregnancy after unprotected sexual intercourse. This pill can be taken right after sex or up to 5 days after unprotected sex. It is most effective the sooner you take the pills after having sexual intercourse. Most emergency contraceptive pills are available without a prescription. Check with your pharmacist. Do not use emergency contraception as your only form of birth control. BARRIER METHODS   Female condom. This is a thin sheath (latex  or rubber) that is worn over the penis during sexual intercourse. It can be used with spermicide to increase effectiveness.  Female condom. This is a soft, loose-fitting sheath that is put into the vagina before sexual intercourse.  Diaphragm. This is a soft, latex, dome-shaped barrier that must be fitted by a health care provider. It is inserted into the vagina, along with a spermicidal jelly. It is inserted before intercourse. The diaphragm should be left in the vagina for 6 to 8 hours after intercourse.  Cervical cap. This is a round, soft, latex or plastic cup that fits over the cervix and must be fitted by a health care provider. The cap can be left in place for up to 48 hours after intercourse.  Sponge. This is a soft, circular piece of polyurethane foam. The sponge has spermicide in it. It is inserted into the vagina after wetting it and before sexual intercourse.  Spermicides. These are chemicals that kill or block sperm from entering the cervix and uterus. They come in the form of  creams, jellies, suppositories, foam, or tablets. They do not require a prescription. They are inserted into the vagina with an applicator before having sexual intercourse. The process must be repeated every time you have sexual intercourse. INTRAUTERINE CONTRACEPTION  Intrauterine device (IUD). This is a T-shaped device that is put in a woman's uterus during a menstrual period to prevent pregnancy. There are 2 types:  Copper IUD. This type of IUD is wrapped in copper wire and is placed inside the uterus. Copper makes the uterus and fallopian tubes produce a fluid that kills sperm. It can stay in place for 10 years.  Hormone IUD. This type of IUD contains the hormone progestin (synthetic progesterone). The hormone thickens the cervical mucus and prevents sperm from entering the uterus, and it also thins the uterine lining to prevent implantation of a fertilized egg. The hormone can weaken or kill the sperm that get into the uterus. It can stay in place for 3-5 years, depending on which type of IUD is used. PERMANENT METHODS OF CONTRACEPTION  Female tubal ligation. This is when the woman's fallopian tubes are surgically sealed, tied, or blocked to prevent the egg from traveling to the uterus.  Hysteroscopic sterilization. This involves placing a small coil or insert into each fallopian tube. Your doctor uses a technique called hysteroscopy to do the procedure. The device causes scar tissue to form. This results in permanent blockage of the fallopian tubes, so the sperm cannot fertilize the egg. It takes about 3 months after the procedure for the tubes to become blocked. You must use another form of birth control for these 3 months.  Female sterilization. This is when the female has the tubes that carry sperm tied off (vasectomy).This blocks sperm from entering the vagina during sexual intercourse. After the procedure, the man can still ejaculate fluid (semen). NATURAL PLANNING METHODS  Natural family  planning. This is not having sexual intercourse or using a barrier method (condom, diaphragm, cervical cap) on days the woman could become pregnant.  Calendar method. This is keeping track of the length of each menstrual cycle and identifying when you are fertile.  Ovulation method. This is avoiding sexual intercourse during ovulation.  Symptothermal method. This is avoiding sexual intercourse during ovulation, using a thermometer and ovulation symptoms.  Post-ovulation method. This is timing sexual intercourse after you have ovulated. Regardless of which type or method of contraception you choose, it is important that you use condoms to  protect against the transmission of sexually transmitted infections (STIs). Talk with your health care provider about which form of contraception is most appropriate for you.   This information is not intended to replace advice given to you by your health care provider. Make sure you discuss any questions you have with your health care provider.   Document Released: 06/14/2005 Document Revised: 06/19/2013 Document Reviewed: 12/07/2012 Elsevier Interactive Patient Education [employer]2016 Elsevier Inc.

## 2016-03-30 ENCOUNTER — Ambulatory Visit (INDEPENDENT_AMBULATORY_CARE_PROVIDER_SITE_OTHER): Payer: Medicaid Other | Admitting: Obstetrics & Gynecology

## 2016-03-30 VITALS — BP 112/74 | HR 90 | Wt 238.0 lb

## 2016-03-30 DIAGNOSIS — O09219 Supervision of pregnancy with history of pre-term labor, unspecified trimester: Secondary | ICD-10-CM

## 2016-03-30 DIAGNOSIS — O09213 Supervision of pregnancy with history of pre-term labor, third trimester: Secondary | ICD-10-CM

## 2016-03-30 DIAGNOSIS — O09899 Supervision of other high risk pregnancies, unspecified trimester: Secondary | ICD-10-CM

## 2016-03-30 DIAGNOSIS — Z98891 History of uterine scar from previous surgery: Secondary | ICD-10-CM

## 2016-03-30 DIAGNOSIS — O0993 Supervision of high risk pregnancy, unspecified, third trimester: Secondary | ICD-10-CM

## 2016-03-30 DIAGNOSIS — Z23 Encounter for immunization: Secondary | ICD-10-CM

## 2016-03-30 DIAGNOSIS — O34219 Maternal care for unspecified type scar from previous cesarean delivery: Secondary | ICD-10-CM

## 2016-03-30 LAB — CBC
HCT: 33.9 % — ABNORMAL LOW (ref 35.0–45.0)
HEMOGLOBIN: 11 g/dL — AB (ref 11.7–15.5)
MCH: 29.3 pg (ref 27.0–33.0)
MCHC: 32.4 g/dL (ref 32.0–36.0)
MCV: 90.4 fL (ref 80.0–100.0)
MPV: 13.7 fL — ABNORMAL HIGH (ref 7.5–12.5)
PLATELETS: 130 10*3/uL — AB (ref 140–400)
RBC: 3.75 MIL/uL — ABNORMAL LOW (ref 3.80–5.10)
RDW: 14.5 % (ref 11.0–15.0)
WBC: 7.7 10*3/uL (ref 3.8–10.8)

## 2016-03-30 NOTE — Patient Instructions (Signed)
Return to clinic for any scheduled appointments or obstetric concerns, or go to [facility] for evaluation  

## 2016-03-30 NOTE — Progress Notes (Signed)
   PRENATAL VISIT NOTE  Subjective:  Michele Hayden is a 25 y.o. [id number] at 3891w0d being seen today for ongoing prenatal care.  She is currently monitored for the following issues for this high-risk pregnancy and has History of low vertical cesarean section; History of preterm delivery, currently pregnant; History of placental abruption; Upper airway cough syndrome; and Supervision of high risk pregnancy in third trimester on her problem list.  Patient reports no complaints.  Contractions: Not present. Vag. Bleeding: None.  Movement: Present. Denies leaking of fluid.   The following portions of the patient's history were reviewed and updated as appropriate: allergies, current medications, past family history, past medical history, past social history, past surgical history and problem list. Problem list updated.  Objective:   Vitals:   03/30/16 1057  BP: 112/74  Pulse: 90  Weight: 238 lb (108 kg)    Fetal Status: Fetal Heart Rate (bpm): 138 Fundal Height: 32 cm Movement: Present     General:  Alert, oriented and cooperative. Patient is in no acute distress.  Skin: Skin is warm and dry. No rash noted.   Cardiovascular: Normal heart rate noted  Respiratory: Normal respiratory effort, no problems with respiration noted  Abdomen: Soft, gravid, appropriate for gestational age. Pain/Pressure: Present     Pelvic:  Cervical exam deferred        Extremities: Normal range of motion.  Edema: None  Mental Status: Normal mood and affect. Normal behavior. Normal judgment and thought content.   Urinalysis:      Assessment and Plan:  Pregnancy: [id number] at 5791w0d  1. History of low vertical cesarean section TOLAC consent already signed.  2. History of preterm delivery, currently pregnant Encouraged to continue progesterone as prescribed.  PTL precautions advised  3. Supervision of high risk pregnancy in third trimester Continue to monitor fundal height, could be increased dur to maternal  habitus.  If continues to be large for dates, may need ultrasound. - CBC - HIV antibody - Glucose Tolerance, 1 HR (50g) - RPR - Tdap vaccine greater than or equal to 7yo IM  Preterm labor symptoms and general obstetric precautions including but not limited to vaginal bleeding, contractions, leaking of fluid and fetal movement were reviewed in detail with the patient. Please refer to After Visit Summary for other counseling recommendations.  Return in about 2 weeks (around 04/13/2016) for OB Visit.  Tereso NewcomerUgonna A Jaileen Janelle, MD

## 2016-03-31 LAB — HIV ANTIBODY (ROUTINE TESTING W REFLEX): HIV: NONREACTIVE

## 2016-03-31 LAB — GLUCOSE TOLERANCE, 1 HOUR (50G) W/O FASTING: GLUCOSE, 1 HR, GESTATIONAL: 90 mg/dL (ref <140)

## 2016-04-01 LAB — RPR

## 2016-04-10 ENCOUNTER — Inpatient Hospital Stay (HOSPITAL_COMMUNITY)
Admission: AD | Admit: 2016-04-10 | Discharge: 2016-04-10 | Disposition: A | Discharge status: Home / Self Care | Payer: Medicaid Other | Source: Ambulatory Visit | Attending: Obstetrics and Gynecology | Admitting: Obstetrics and Gynecology

## 2016-04-10 ENCOUNTER — Encounter (HOSPITAL_COMMUNITY): Payer: Self-pay | Admitting: *Deleted

## 2016-04-10 DIAGNOSIS — K5900 Constipation, unspecified: Secondary | ICD-10-CM | POA: Insufficient documentation

## 2016-04-10 DIAGNOSIS — O26893 Other specified pregnancy related conditions, third trimester: Secondary | ICD-10-CM | POA: Diagnosis not present

## 2016-04-10 DIAGNOSIS — O99613 Diseases of the digestive system complicating pregnancy, third trimester: Secondary | ICD-10-CM | POA: Diagnosis not present

## 2016-04-10 DIAGNOSIS — Z87891 Personal history of nicotine dependence: Secondary | ICD-10-CM | POA: Diagnosis not present

## 2016-04-10 DIAGNOSIS — K219 Gastro-esophageal reflux disease without esophagitis: Secondary | ICD-10-CM | POA: Diagnosis not present

## 2016-04-10 DIAGNOSIS — Z3A29 29 weeks gestation of pregnancy: Secondary | ICD-10-CM | POA: Insufficient documentation

## 2016-04-10 DIAGNOSIS — O0993 Supervision of high risk pregnancy, unspecified, third trimester: Secondary | ICD-10-CM

## 2016-04-10 DIAGNOSIS — R109 Unspecified abdominal pain: Secondary | ICD-10-CM | POA: Diagnosis present

## 2016-04-10 LAB — URINALYSIS, ROUTINE W REFLEX MICROSCOPIC
Bilirubin Urine: NEGATIVE
Glucose, UA: NEGATIVE mg/dL
HGB URINE DIPSTICK: NEGATIVE
KETONES UR: 15 mg/dL — AB
LEUKOCYTES UA: NEGATIVE
NITRITE: NEGATIVE
PROTEIN: NEGATIVE mg/dL
pH: 6 (ref 5.0–8.0)

## 2016-04-10 MED ORDER — DOCUSATE SODIUM 100 MG PO CAPS: 100.0000 mg | ORAL_CAPSULE | Freq: Two times a day (BID) | ORAL | 0 refills | Status: DC

## 2016-04-10 MED ORDER — FLEET ENEMA 7-19 GM/118ML RE ENEM
1.0000 | ENEMA | Freq: Once | RECTAL | Status: AC
  Administered 2016-04-10: 1 via RECTAL

## 2016-04-10 NOTE — MAU Provider Note (Signed)
Chief Complaint:  Constipation and Abdominal Pain   First Provider Initiated Contact with Patient 04/10/16 1635      HPI: Michele Hayden is a 25 y.o. [id number] at [redacted]w[redacted]d who presents to maternity admissions reporting constipation.  Patient is coming in complaining of severe constipation like pain. She cannot remember when last time she had a BM, or if she is passing flatus, but she thinks it has been about 1 week. She denies contractions, but states having that "constipation" type pain and fullness. She states 1.5 hrs before arriving to MAU she had 40mL of Milk of Magnesia, and prior to that she had prune juice. She has colace at home but has not been taking it. She is only on PNV no extra iron. Denies drinking many fluids during the day. Tolerating diet, unsure if she gets enough fiber. Some nausea this AM with some dry-heaving. But able to eat today. Denies fevers/chills, CP/SOB. Abdominal pain/cramping is not very severe but very uncomfortable per patient.  Denies contractions, leakage of fluid or vaginal bleeding. Good fetal movement.   Pregnancy Course:  H/o preterm delivery (abruption) at [redacted] weeks EGA in 2015, currently on Progesterone therapy.  Past Medical History: Past Medical History:  Diagnosis Date  . Chlamydia infection complicating pregnancy in second trimester 04/10/2013  . GERD (gastroesophageal reflux disease)     Past obstetric history: OB History  Gravida Para Term Preterm AB Living  5 2 1 1 2 1   SAB TAB Ectopic Multiple Live Births  1 1     2     # Outcome Date GA Lbr Len/2nd Weight Sex Delivery Anes PTL Lv  5 Current           4 TAB 09/27/14          3 Preterm 04/13/14 [redacted]w[redacted]d  1 lb 2.7 oz (0.53 kg) F CS-LVertical EPI, Local  ND  2 Term 09/18/13 [redacted]w[redacted]d 10:15 / 01:05 7 lb 7 oz (3.374 kg) M Vag-Spont EPI  LIV  1 SAB             Obstetric Comments  # abruption    Past Surgical History: Past Surgical History:  Procedure Laterality Date  . CESAREAN SECTION N/A  04/13/2014   Procedure: CESAREAN SECTION;  Surgeon: Tilda Burrow, MD;  Location: [facility];  Service: Obstetrics;  Laterality: N/A;  . DILATION AND CURETTAGE OF UTERUS    . WISDOM TOOTH EXTRACTION       Family History: Family History  Problem Relation Age of Onset  . Asthma Brother   . Diabetes Paternal Grandmother   . Kidney disease Paternal Grandmother   . Heart disease Paternal Grandmother   . Heart attack Paternal Grandmother   . Diabetes Maternal Aunt     Social History: Social History  Substance Use Topics  . Smoking status: Former Smoker    Packs/day: 0.50    Years: 7.00    Types: Cigarettes    Quit date: 06/28/2014  . Smokeless tobacco: Never Used  . Alcohol use No     Comment: occasional    Allergies: No Known Allergies  Meds:  Prescriptions Prior to Admission  Medication Sig Dispense Refill Last Dose  . famotidine (PEPCID) 20 MG tablet Take 1 tablet (20 mg total) by mouth 2 (two) times daily. (Patient not taking: Reported on 03/30/2016) 60 tablet 3 Not Taking  . ondansetron (ZOFRAN ODT) 4 MG disintegrating tablet Take 1 tablet (4 mg total) by mouth every 6 (six) hours as needed  for nausea. (Patient not taking: Reported on 03/30/2016) 20 tablet 0 Not Taking  . prenatal vitamin w/FE, FA (NATACHEW) 29-1 MG CHEW chewable tablet Chew 1 tablet by mouth daily at 12 noon.   Taking  . progesterone (PROMETRIUM) 200 MG capsule Take 1 capsule (200 mg total) by mouth 2 (two) times daily. (Patient not taking: Reported on 03/30/2016) 60 capsule 3 Not Taking    I have reviewed patient's Past Medical Hx, Surgical Hx, Family Hx, Social Hx, medications and allergies.   ROS:  A comprehensive ROS was negative except per HPI.    Physical Exam  Patient Vitals for the past 24 hrs:  BP Temp Temp src Pulse Resp Weight  04/10/16 1556 127/67 98.1 F (36.7 C) Oral 96 16 236 lb 8 oz (107.3 kg)   Constitutional: Well-developed, well-nourished female in no acute distress.  Cardiovascular:  normal rate Respiratory: normal effort GI: Abd soft, non-tender, gravid appropriate for gestational age. Pos BS x 4 MS: Extremities nontender, no edema, normal ROM Neurologic: Alert and oriented x 4.  GU: Neg CVAT. Pelvic: NEFG, physiologic discharge, no blood, cervix clean. No CMT  Dilation: Closed Effacement (%): Thick Cervical Position: Posterior Station:  (high) Exam by:: Dr Omer JackMumaw  FHT:  Baseline 130 , moderate variability, accelerations present, no decelerations Contractions: None   Labs: Results for orders placed or performed during the hospital encounter of 04/10/16 (from the past 24 hour(s))  Urinalysis, Routine w reflex microscopic ([facility])     Status: Abnormal   Collection Time: 04/10/16  4:00 PM  Result Value Ref Range   Color, Urine YELLOW YELLOW   APPearance CLEAR CLEAR   Specific Gravity, Urine >1.030 (H) 1.005 - 1.030   pH 6.0 5.0 - 8.0   Glucose, UA NEGATIVE NEGATIVE mg/dL   Hgb urine dipstick NEGATIVE NEGATIVE   Bilirubin Urine NEGATIVE NEGATIVE   Ketones, ur 15 (A) NEGATIVE mg/dL   Protein, ur NEGATIVE NEGATIVE mg/dL   Nitrite NEGATIVE NEGATIVE   Leukocytes, UA NEGATIVE NEGATIVE    I personally reviewed the patient's NST today, found to be REACTIVE. 130 bpm, mod var, +accels, no decels. CTX: None.   Imaging:  No results found.  [facility]U Course:   SVE: closed, thick, high NST- reactive without contractions Fleet enema  5:12 PM - Patient had a good bowel movement after the fleet enema, discomfort has resolved, would like to go home.   MDM: Plan of care reviewed with patient, including labs and tests ordered and medical treatment. Discussed to restart taking colace twice daily, increase fluid intake significantly, and try increasing fiber in the diet (ie. Two bowls of raisin bran cereal per day; fiber handout given).   Assessment: 1. Constipation during pregnancy in third trimester   2. Supervision of high risk pregnancy in third trimester      Plan: Discharge home in stable condition.  Preterm Labor precautions and fetal kick counts Hydration and fiber intake daily emphasized      Medication List    STOP taking these medications   famotidine 20 MG tablet Commonly known as:  PEPCID   ondansetron 4 MG disintegrating tablet Commonly known as:  ZOFRAN ODT     TAKE these medications   docusate sodium 100 MG capsule Commonly known as:  COLACE Take 1 capsule (100 mg total) by mouth 2 (two) times daily.   prenatal vitamin w/FE, FA 29-1 MG Chew chewable tablet Chew 1 tablet by mouth daily at 12 noon.   progesterone 200 MG  capsule Commonly known as:  PROMETRIUM Take 1 capsule (200 mg total) by mouth 2 (two) times daily.       [address] [place], [place] 04/10/2016 4:47 PM

## 2016-04-10 NOTE — Discharge Instructions (Signed)
Constipation, Adult °Constipation is when a person: °· Poops (has a bowel movement) less than 3 times a week. °· Has a hard time pooping. °· Has poop that is dry, hard, or bigger than normal. °HOME CARE  °· Eat foods with a lot of fiber in them. This includes fruits, vegetables, beans, and whole grains such as brown rice. °· Avoid fatty foods and foods with a lot of sugar. This includes french fries, hamburgers, cookies, candy, and soda. °· If you are not getting enough fiber from food, take products with added fiber in them (supplements). °· Drink enough fluid to keep your pee (urine) clear or pale yellow. °· Exercise on a regular basis, or as told by your doctor. °· Go to the restroom when you feel like you need to poop. Do not hold it. °· Only take medicine as told by your doctor. Do not take medicines that help you poop (laxatives) without talking to your doctor first. °GET HELP RIGHT AWAY IF:  °· You have bright red blood in your poop (stool). °· Your constipation lasts more than 4 days or gets worse. °· You have belly (abdominal) or butt (rectal) pain. °· You have thin poop (as thin as a pencil). °· You lose weight, and it cannot be explained. °MAKE SURE YOU:  °· Understand these instructions. °· Will watch your condition. °· Will get help right away if you are not doing well or get worse. °  °This information is not intended to replace advice given to you by your health care provider. Make sure you discuss any questions you have with your health care provider. °  °Document Released: 12/01/2007 Document Revised: 07/05/2014 Document Reviewed: 03/26/2013 °[employer] Interactive Patient Education ©2016 [employer] ° °High-Fiber Diet °Fiber, also called dietary fiber, is a type of carbohydrate found in fruits, vegetables, whole grains, and beans. A high-fiber diet can have many health benefits. Your health care provider may recommend a high-fiber diet to help: °· Prevent constipation. Fiber can make your bowel  movements more regular. °· Lower your cholesterol. °· Relieve hemorrhoids, uncomplicated diverticulosis, or irritable bowel syndrome. °· Prevent overeating as part of a weight-loss plan. °· Prevent heart disease, type 2 diabetes, and certain cancers. °WHAT IS MY PLAN? °The recommended daily intake of fiber includes: °· 38 grams for men under age 50. °· 30 grams for men over age 50. °· 25 grams for women under age 50. °· 21 grams for women over age 50. °You can get the recommended daily intake of dietary fiber by eating a variety of fruits, vegetables, grains, and beans. Your health care provider may also recommend a fiber supplement if it is not possible to get enough fiber through your diet. °WHAT DO I NEED TO KNOW ABOUT A HIGH-FIBER DIET? °· Fiber supplements have not been widely studied for their effectiveness, so it is better to get fiber through food sources. °· Always check the fiber content on the nutrition facts label of any prepackaged food. Look for foods that contain at least 5 grams of fiber per serving. °· Ask your dietitian if you have questions about specific foods that are related to your condition, especially if those foods are not listed in the following section. °· Increase your daily fiber consumption gradually. Increasing your intake of dietary fiber too quickly may cause bloating, cramping, or gas. °· Drink plenty of water. Water helps you to digest fiber. °WHAT FOODS CAN I EAT? °Grains °Whole-grain breads. Multigrain cereal. Oats and oatmeal. Brown rice. Barley.   Bulgur wheat. Millet. Bran muffins. Popcorn. Rye wafer crackers. °Vegetables °Sweet potatoes. Spinach. Kale. Artichokes. Cabbage. Broccoli. Green peas. Carrots. Squash. °Fruits °Berries. Pears. Apples. Oranges. Avocados. Prunes and raisins. Dried figs. °Meats and Other Protein Sources °Navy, kidney, pinto, and soy beans. Split peas. Lentils. Nuts and seeds. °Dairy °Fiber-fortified yogurt. °Beverages °Fiber-fortified soy milk.  Fiber-fortified orange juice. °Other °Fiber bars. °The items listed above may not be a complete list of recommended foods or beverages. Contact your dietitian for more options. °WHAT FOODS ARE NOT RECOMMENDED? °Grains °White bread. Pasta made with refined flour. White rice. °Vegetables °Fried potatoes. Canned vegetables. Well-cooked vegetables.  °Fruits °Fruit juice. Cooked, strained fruit. °Meats and Other Protein Sources °Fatty cuts of meat. Fried poultry or fried fish. °Dairy °Milk. Yogurt. Cream cheese. Sour cream. °Beverages °Soft drinks. °Other °Cakes and pastries. Butter and oils. °The items listed above may not be a complete list of foods and beverages to avoid. Contact your dietitian for more information. °WHAT ARE SOME TIPS FOR INCLUDING HIGH-FIBER FOODS IN MY DIET? °· Eat a wide variety of high-fiber foods. °· Make sure that half of all grains consumed each day are whole grains. °· Replace breads and cereals made from refined flour or white flour with whole-grain breads and cereals. °· Replace white rice with brown rice, bulgur wheat, or millet. °· Start the day with a breakfast that is high in fiber, such as a cereal that contains at least 5 grams of fiber per serving. °· Use beans in place of meat in soups, salads, or pasta. °· Eat high-fiber snacks, such as berries, raw vegetables, nuts, or popcorn. °  °This information is not intended to replace advice given to you by your health care provider. Make sure you discuss any questions you have with your health care provider. °  °Document Released: 06/14/2005 Document Revised: 07/05/2014 Document Reviewed: 11/27/2013 °[employer] Interactive Patient Education ©2016 [employer] ° °

## 2016-04-10 NOTE — MAU Note (Signed)
Has been having a lot of constipation, some tightness in abd.  Has been nauseated, threw up once this morning.  Last BM was 6 days ago. Had not taken anything until about an hour ago.

## 2016-04-13 ENCOUNTER — Ambulatory Visit (INDEPENDENT_AMBULATORY_CARE_PROVIDER_SITE_OTHER): Payer: Medicaid Other | Admitting: Family Medicine

## 2016-04-13 VITALS — BP 109/73 | HR 84 | Wt 238.0 lb

## 2016-04-13 DIAGNOSIS — O34219 Maternal care for unspecified type scar from previous cesarean delivery: Secondary | ICD-10-CM

## 2016-04-13 DIAGNOSIS — O09899 Supervision of other high risk pregnancies, unspecified trimester: Secondary | ICD-10-CM

## 2016-04-13 DIAGNOSIS — Z8759 Personal history of other complications of pregnancy, childbirth and the puerperium: Secondary | ICD-10-CM

## 2016-04-13 DIAGNOSIS — O09219 Supervision of pregnancy with history of pre-term labor, unspecified trimester: Secondary | ICD-10-CM

## 2016-04-13 DIAGNOSIS — O09213 Supervision of pregnancy with history of pre-term labor, third trimester: Secondary | ICD-10-CM

## 2016-04-13 DIAGNOSIS — O0993 Supervision of high risk pregnancy, unspecified, third trimester: Secondary | ICD-10-CM

## 2016-04-13 DIAGNOSIS — Z98891 History of uterine scar from previous surgery: Secondary | ICD-10-CM

## 2016-04-13 NOTE — Patient Instructions (Signed)
Third Trimester of Pregnancy The third trimester is from week 29 through week 42, months 7 through 9. The third trimester is a time when the fetus is growing rapidly. At the end of the ninth month, the fetus is about 20 inches in length and weighs 6-10 pounds.  BODY CHANGES Your body goes through many changes during pregnancy. The changes vary from woman to woman.   Your weight will continue to increase. You can expect to gain 25-35 pounds (11-16 kg) by the end of the pregnancy.  You may begin to get stretch marks on your hips, abdomen, and breasts.  You may urinate more often because the fetus is moving lower into your pelvis and pressing on your bladder.  You may develop or continue to have heartburn as a result of your pregnancy.  You may develop constipation because certain hormones are causing the muscles that push waste through your intestines to slow down.  You may develop hemorrhoids or swollen, bulging veins (varicose veins).  You may have pelvic pain because of the weight gain and pregnancy hormones relaxing your joints between the bones in your pelvis. Backaches may result from overexertion of the muscles supporting your posture.  You may have changes in your hair. These can include thickening of your hair, rapid growth, and changes in texture. Some women also have hair loss during or after pregnancy, or hair that feels dry or thin. Your hair will most likely return to normal after your baby is born.  Your breasts will continue to grow and be tender. A yellow discharge may leak from your breasts called colostrum.  Your belly button may stick out.  You may feel short of breath because of your expanding uterus.  You may notice the fetus "dropping," or moving lower in your abdomen.  You may have a bloody mucus discharge. This usually occurs a few days to a week before labor begins.  Your cervix becomes thin and soft (effaced) near your due date. WHAT TO EXPECT AT YOUR  PRENATAL EXAMS  You will have prenatal exams every 2 weeks until week 36. Then, you will have weekly prenatal exams. During a routine prenatal visit:  You will be weighed to make sure you and the fetus are growing normally.  Your blood pressure is taken.  Your abdomen will be measured to track your baby's growth.  The fetal heartbeat will be listened to.  Any test results from the previous visit will be discussed.  You may have a cervical check near your due date to see if you have effaced. At around 36 weeks, your caregiver will check your cervix. At the same time, your caregiver will also perform a test on the secretions of the vaginal tissue. This test is to determine if a type of bacteria, Group B streptococcus, is present. Your caregiver will explain this further. Your caregiver may ask you:  What your birth plan is.  How you are feeling.  If you are feeling the baby move.  If you have had any abnormal symptoms, such as leaking fluid, bleeding, severe headaches, or abdominal cramping.  If you are using any tobacco products, including cigarettes, chewing tobacco, and electronic cigarettes.  If you have any questions. Other tests or screenings that may be performed during your third trimester include:  Blood tests that check for low iron levels (anemia).  Fetal testing to check the health, activity level, and growth of the fetus. Testing is done if you have certain medical conditions or if   there are problems during the pregnancy.  HIV (human immunodeficiency virus) testing. If you are at high risk, you may be screened for HIV during your third trimester of pregnancy. FALSE LABOR You may feel small, irregular contractions that eventually go away. These are called Braxton Hicks contractions, or false labor. Contractions may last for hours, days, or even weeks before true labor sets in. If contractions come at regular intervals, intensify, or become painful, it is best to be seen  by your caregiver.  SIGNS OF LABOR   Menstrual-like cramps.  Contractions that are 5 minutes apart or less.  Contractions that start on the top of the uterus and spread down to the lower abdomen and back.  A sense of increased pelvic pressure or back pain.  A watery or bloody mucus discharge that comes from the vagina. If you have any of these signs before the 37th week of pregnancy, call your caregiver right away. You need to go to the hospital to get checked immediately. HOME CARE INSTRUCTIONS   Avoid all smoking, herbs, alcohol, and unprescribed drugs. These chemicals affect the formation and growth of the baby.  Do not use any tobacco products, including cigarettes, chewing tobacco, and electronic cigarettes. If you need help quitting, ask your health care provider. You may receive counseling support and other resources to help you quit.  Follow your caregiver's instructions regarding medicine use. There are medicines that are either safe or unsafe to take during pregnancy.  Exercise only as directed by your caregiver. Experiencing uterine cramps is a good sign to stop exercising.  Continue to eat regular, healthy meals.  Wear a good support bra for breast tenderness.  Do not use hot tubs, steam rooms, or saunas.  Wear your seat belt at all times when driving.  Avoid raw meat, uncooked cheese, cat litter boxes, and soil used by cats. These carry germs that can cause birth defects in the baby.  Take your prenatal vitamins.  Take 1500-2000 mg of calcium daily starting at the 20th week of pregnancy until you deliver your baby.  Try taking a stool softener (if your caregiver approves) if you develop constipation. Eat more high-fiber foods, such as fresh vegetables or fruit and whole grains. Drink plenty of fluids to keep your urine clear or pale yellow.  Take warm sitz baths to soothe any pain or discomfort caused by hemorrhoids. Use hemorrhoid cream if your caregiver  approves.  If you develop varicose veins, wear support hose. Elevate your feet for 15 minutes, 3-4 times a day. Limit salt in your diet.  Avoid heavy lifting, wear low heal shoes, and practice good posture.  Rest a lot with your legs elevated if you have leg cramps or low back pain.  Visit your dentist if you have not gone during your pregnancy. Use a soft toothbrush to brush your teeth and be gentle when you floss.  A sexual relationship may be continued unless your caregiver directs you otherwise.  Do not travel far distances unless it is absolutely necessary and only with the approval of your caregiver.  Take prenatal classes to understand, practice, and ask questions about the labor and delivery.  Make a trial run to the hospital.  Pack your hospital bag.  Prepare the baby's nursery.  Continue to go to all your prenatal visits as directed by your caregiver. SEEK MEDICAL CARE IF:  You are unsure if you are in labor or if your water has broken.  You have dizziness.  You have   mild pelvic cramps, pelvic pressure, or nagging pain in your abdominal area.  You have persistent nausea, vomiting, or diarrhea.  You have a bad smelling vaginal discharge.  You have pain with urination. SEEK IMMEDIATE MEDICAL CARE IF:   You have a fever.  You are leaking fluid from your vagina.  You have spotting or bleeding from your vagina.  You have severe abdominal cramping or pain.  You have rapid weight loss or gain.  You have shortness of breath with chest pain.  You notice sudden or extreme swelling of your face, hands, ankles, feet, or legs.  You have not felt your baby move in over an hour.  You have severe headaches that do not go away with medicine.  You have vision changes.   This information is not intended to replace advice given to you by your health care provider. Make sure you discuss any questions you have with your health care provider.   Document Released:  06/08/2001 Document Revised: 07/05/2014 Document Reviewed: 08/15/2012 Elsevier Interactive Patient Education 2016 [employer]  Breastfeeding Deciding to breastfeed is one of the best choices you can make for you and your baby. A change in hormones during pregnancy causes your breast tissue to grow and increases the number and size of your milk ducts. These hormones also allow proteins, sugars, and fats from your blood supply to make breast milk in your milk-producing glands. Hormones prevent breast milk from being released before your baby is born as well as prompt milk flow after birth. Once breastfeeding has begun, thoughts of your baby, as well as his or her sucking or crying, can stimulate the release of milk from your milk-producing glands.  BENEFITS OF BREASTFEEDING For Your Baby  Your first milk (colostrum) helps your baby's digestive system function better.  There are antibodies in your milk that help your baby fight off infections.  Your baby has a lower incidence of asthma, allergies, and sudden infant death syndrome.  The nutrients in breast milk are better for your baby than infant formulas and are designed uniquely for your baby's needs.  Breast milk improves your baby's brain development.  Your baby is less likely to develop other conditions, such as childhood obesity, asthma, or type 2 diabetes mellitus. For You  Breastfeeding helps to create a very special bond between you and your baby.  Breastfeeding is convenient. Breast milk is always available at the correct temperature and costs nothing.  Breastfeeding helps to burn calories and helps you lose the weight gained during pregnancy.  Breastfeeding makes your uterus contract to its prepregnancy size faster and slows bleeding (lochia) after you give birth.   Breastfeeding helps to lower your risk of developing type 2 diabetes mellitus, osteoporosis, and breast or ovarian cancer later in life. SIGNS THAT YOUR BABY IS  HUNGRY Early Signs of Hunger  Increased alertness or activity.  Stretching.  Movement of the head from side to side.  Movement of the head and opening of the mouth when the corner of the mouth or cheek is stroked (rooting).  Increased sucking sounds, smacking lips, cooing, sighing, or squeaking.  Hand-to-mouth movements.  Increased sucking of fingers or hands. Late Signs of Hunger  Fussing.  Intermittent crying. Extreme Signs of Hunger Signs of extreme hunger will require calming and consoling before your baby will be able to breastfeed successfully. Do not wait for the following signs of extreme hunger to occur before you initiate breastfeeding:  Restlessness.  A loud, strong cry.  Screaming.   BREASTFEEDING BASICS Breastfeeding Initiation  Find a comfortable place to sit or lie down, with your neck and back well supported.  Place a pillow or rolled up blanket under your baby to bring him or her to the level of your breast (if you are seated). Nursing pillows are specially designed to help support your arms and your baby while you breastfeed.  Make sure that your baby's abdomen is facing your abdomen.  Gently massage your breast. With your fingertips, massage from your chest wall toward your nipple in a circular motion. This encourages milk flow. You may need to continue this action during the feeding if your milk flows slowly.  Support your breast with 4 fingers underneath and your thumb above your nipple. Make sure your fingers are well away from your nipple and your baby's mouth.  Stroke your baby's lips gently with your finger or nipple.  When your baby's mouth is open wide enough, quickly bring your baby to your breast, placing your entire nipple and as much of the colored area around your nipple (areola) as possible into your baby's mouth.  More areola should be visible above your baby's upper lip than below the lower lip.  Your baby's tongue should be between his  or her lower gum and your breast.  Ensure that your baby's mouth is correctly positioned around your nipple (latched). Your baby's lips should create a seal on your breast and be turned out (everted).  It is common for your baby to suck about 2-3 minutes in order to start the flow of breast milk. Latching Teaching your baby how to latch on to your breast properly is very important. An improper latch can cause nipple pain and decreased milk supply for you and poor weight gain in your baby. Also, if your baby is not latched onto your nipple properly, he or she may swallow some air during feeding. This can make your baby fussy. Burping your baby when you switch breasts during the feeding can help to get rid of the air. However, teaching your baby to latch on properly is still the best way to prevent fussiness from swallowing air while breastfeeding. Signs that your baby has successfully latched on to your nipple:  Silent tugging or silent sucking, without causing you pain.  Swallowing heard between every 3-4 sucks.  Muscle movement above and in front of his or her ears while sucking. Signs that your baby has not successfully latched on to nipple:  Sucking sounds or smacking sounds from your baby while breastfeeding.  Nipple pain. If you think your baby has not latched on correctly, slip your finger into the corner of your baby's mouth to break the suction and place it between your baby's gums. Attempt breastfeeding initiation again. Signs of Successful Breastfeeding Signs from your baby:  A gradual decrease in the number of sucks or complete cessation of sucking.  Falling asleep.  Relaxation of his or her body.  Retention of a small amount of milk in his or her mouth.  Letting go of your breast by himself or herself. Signs from you:  Breasts that have increased in firmness, weight, and size 1-3 hours after feeding.  Breasts that are softer immediately after  breastfeeding.  Increased milk volume, as well as a change in milk consistency and color by the fifth day of breastfeeding.  Nipples that are not sore, cracked, or bleeding. Signs That Your Baby is Getting Enough Milk  Wetting at least 3 diapers in a 24-hour period.   The urine should be clear and pale yellow by age 5 days.  At least 3 stools in a 24-hour period by age 5 days. The stool should be soft and yellow.  At least 3 stools in a 24-hour period by age 7 days. The stool should be seedy and yellow.  No loss of weight greater than 10% of birth weight during the first 3 days of age.  Average weight gain of 4-7 ounces (113-198 g) per week after age 4 days.  Consistent daily weight gain by age 5 days, without weight loss after the age of 2 weeks. After a feeding, your baby may spit up a small amount. This is common. BREASTFEEDING FREQUENCY AND DURATION Frequent feeding will help you make more milk and can prevent sore nipples and breast engorgement. Breastfeed when you feel the need to reduce the fullness of your breasts or when your baby shows signs of hunger. This is called "breastfeeding on demand." Avoid introducing a pacifier to your baby while you are working to establish breastfeeding (the first 4-6 weeks after your baby is born). After this time you may choose to use a pacifier. Research has shown that pacifier use during the first year of a baby's life decreases the risk of sudden infant death syndrome (SIDS). Allow your baby to feed on each breast as long as he or she wants. Breastfeed until your baby is finished feeding. When your baby unlatches or falls asleep while feeding from the first breast, offer the second breast. Because newborns are often sleepy in the first few weeks of life, you may need to awaken your baby to get him or her to feed. Breastfeeding times will vary from baby to baby. However, the following rules can serve as a guide to help you ensure that your baby is  properly fed:  Newborns (babies 4 weeks of age or younger) may breastfeed every 1-3 hours.  Newborns should not go longer than 3 hours during the day or 5 hours during the night without breastfeeding.  You should breastfeed your baby a minimum of 8 times in a 24-hour period until you begin to introduce solid foods to your baby at around 6 months of age. BREAST MILK PUMPING Pumping and storing breast milk allows you to ensure that your baby is exclusively fed your breast milk, even at times when you are unable to breastfeed. This is especially important if you are going back to work while you are still breastfeeding or when you are not able to be present during feedings. Your lactation consultant can give you guidelines on how long it is safe to store breast milk. A breast pump is a machine that allows you to pump milk from your breast into a sterile bottle. The pumped breast milk can then be stored in a refrigerator or freezer. Some breast pumps are operated by hand, while others use electricity. Ask your lactation consultant which type will work best for you. Breast pumps can be purchased, but some hospitals and breastfeeding support groups lease breast pumps on a monthly basis. A lactation consultant can teach you how to hand express breast milk, if you prefer not to use a pump. CARING FOR YOUR BREASTS WHILE YOU BREASTFEED Nipples can become dry, cracked, and sore while breastfeeding. The following recommendations can help keep your breasts moisturized and healthy:  Avoid using soap on your nipples.  Wear a supportive bra. Although not required, special nursing bras and tank tops are designed to allow access to your   breasts for breastfeeding without taking off your entire bra or top. Avoid wearing underwire-style bras or extremely tight bras.  Air dry your nipples for 3-4minutes after each feeding.  Use only cotton bra pads to absorb leaked breast milk. Leaking of breast milk between feedings  is normal.  Use lanolin on your nipples after breastfeeding. Lanolin helps to maintain your skin's normal moisture barrier. If you use pure lanolin, you do not need to wash it off before feeding your baby again. Pure lanolin is not toxic to your baby. You may also hand express a few drops of breast milk and gently massage that milk into your nipples and allow the milk to air dry. In the first few weeks after giving birth, some women experience extremely full breasts (engorgement). Engorgement can make your breasts feel heavy, warm, and tender to the touch. Engorgement peaks within 3-5 days after you give birth. The following recommendations can help ease engorgement:  Completely empty your breasts while breastfeeding or pumping. You may want to start by applying warm, moist heat (in the shower or with warm water-soaked hand towels) just before feeding or pumping. This increases circulation and helps the milk flow. If your baby does not completely empty your breasts while breastfeeding, pump any extra milk after he or she is finished.  Wear a snug bra (nursing or regular) or tank top for 1-2 days to signal your body to slightly decrease milk production.  Apply ice packs to your breasts, unless this is too uncomfortable for you.  Make sure that your baby is latched on and positioned properly while breastfeeding. If engorgement persists after 48 hours of following these recommendations, contact your health care provider or a lactation consultant. OVERALL HEALTH CARE RECOMMENDATIONS WHILE BREASTFEEDING  Eat healthy foods. Alternate between meals and snacks, eating 3 of each per day. Because what you eat affects your breast milk, some of the foods may make your baby more irritable than usual. Avoid eating these foods if you are sure that they are negatively affecting your baby.  Drink milk, fruit juice, and water to satisfy your thirst (about 10 glasses a day).  Rest often, relax, and continue to take  your prenatal vitamins to prevent fatigue, stress, and anemia.  Continue breast self-awareness checks.  Avoid chewing and smoking tobacco. Chemicals from cigarettes that pass into breast milk and exposure to secondhand smoke may harm your baby.  Avoid alcohol and drug use, including marijuana. Some medicines that may be harmful to your baby can pass through breast milk. It is important to ask your health care provider before taking any medicine, including all over-the-counter and prescription medicine as well as vitamin and herbal supplements. It is possible to become pregnant while breastfeeding. If birth control is desired, ask your health care provider about options that will be safe for your baby. SEEK MEDICAL CARE IF:  You feel like you want to stop breastfeeding or have become frustrated with breastfeeding.  You have painful breasts or nipples.  Your nipples are cracked or bleeding.  Your breasts are red, tender, or warm.  You have a swollen area on either breast.  You have a fever or chills.  You have nausea or vomiting.  You have drainage other than breast milk from your nipples.  Your breasts do not become full before feedings by the fifth day after you give birth.  You feel sad and depressed.  Your baby is too sleepy to eat well.  Your baby is having trouble sleeping.     Your baby is wetting less than 3 diapers in a 24-hour period.  Your baby has less than 3 stools in a 24-hour period.  Your baby's skin or the white part of his or her eyes becomes yellow.   Your baby is not gaining weight by 5 days of age. SEEK IMMEDIATE MEDICAL CARE IF:  Your baby is overly tired (lethargic) and does not want to wake up and feed.  Your baby develops an unexplained fever.   This information is not intended to replace advice given to you by your health care provider. Make sure you discuss any questions you have with your health care provider.   Document Released: 06/14/2005  Document Revised: 03/05/2015 Document Reviewed: 12/06/2012 Elsevier Interactive Patient Education 2016 [employer]  

## 2016-04-13 NOTE — Progress Notes (Signed)
   PRENATAL VISIT NOTE  Subjective:  Michele Hayden is a 25 y.o. [id number] at 553w0d being seen today for ongoing prenatal care.  She is currently monitored for the following issues for this high-risk pregnancy and has History of low vertical cesarean section; History of preterm delivery, currently pregnant; History of placental abruption; Upper airway cough syndrome; and Supervision of high risk pregnancy in third trimester on her problem list.  Patient reports no complaints.  Contractions: Not present.  .  Movement: Present. Denies leaking of fluid.   The following portions of the patient's history were reviewed and updated as appropriate: allergies, current medications, past family history, past medical history, past social history, past surgical history and problem list. Problem list updated.  Objective:   Vitals:   04/13/16 1103  BP: 109/73  Pulse: 84  Weight: 238 lb (108 kg)    Fetal Status: Fetal Heart Rate (bpm): 137 Fundal Height: 33 cm Movement: Present     General:  Alert, oriented and cooperative. Patient is in no acute distress.  Skin: Skin is warm and dry. No rash noted.   Cardiovascular: Normal heart rate noted  Respiratory: Normal respiratory effort, no problems with respiration noted  Abdomen: Soft, gravid, appropriate for gestational age. Pain/Pressure: Present     Pelvic:  Cervical exam deferred        Extremities: Normal range of motion.  Edema: None  Mental Status: Normal mood and affect. Normal behavior. Normal judgment and thought content.   Assessment and Plan:  Pregnancy: [id number] at 153w0d  1. Supervision of high risk pregnancy in third trimester Continue prenatal care.   2. History of low vertical cesarean section Desires [facility]--consent signed previously  3. History of placental abruption No evidence of this with this pregnancy  4. History of preterm delivery, currently pregnant Continue Prometrium  Preterm labor symptoms and general obstetric  precautions including but not limited to vaginal bleeding, contractions, leaking of fluid and fetal movement were reviewed in detail with the patient. Please refer to After Visit Summary for other counseling recommendations.  Return in 2 weeks (on 04/27/2016).  Reva Boresanya S Triniti Gruetzmacher, MD

## 2016-04-15 ENCOUNTER — Encounter: Payer: Self-pay | Admitting: *Deleted

## 2016-04-28 ENCOUNTER — Encounter: Payer: Medicaid Other | Admitting: Family Medicine

## 2016-05-05 ENCOUNTER — Ambulatory Visit (INDEPENDENT_AMBULATORY_CARE_PROVIDER_SITE_OTHER): Payer: Medicaid Other | Admitting: Obstetrics and Gynecology

## 2016-05-05 VITALS — BP 112/73 | HR 87 | Wt 240.0 lb

## 2016-05-05 DIAGNOSIS — O0993 Supervision of high risk pregnancy, unspecified, third trimester: Secondary | ICD-10-CM

## 2016-05-05 DIAGNOSIS — O09213 Supervision of pregnancy with history of pre-term labor, third trimester: Secondary | ICD-10-CM | POA: Diagnosis not present

## 2016-05-05 DIAGNOSIS — Z23 Encounter for immunization: Secondary | ICD-10-CM

## 2016-05-05 DIAGNOSIS — O34219 Maternal care for unspecified type scar from previous cesarean delivery: Secondary | ICD-10-CM

## 2016-05-05 DIAGNOSIS — Z98891 History of uterine scar from previous surgery: Secondary | ICD-10-CM

## 2016-05-05 DIAGNOSIS — O09899 Supervision of other high risk pregnancies, unspecified trimester: Secondary | ICD-10-CM

## 2016-05-05 DIAGNOSIS — O09219 Supervision of pregnancy with history of pre-term labor, unspecified trimester: Principal | ICD-10-CM

## 2016-05-05 NOTE — Progress Notes (Signed)
   PRENATAL VISIT NOTE  Subjective:  Michele Hayden is a 25 y.o. [id number] at 7863w1d being seen today for ongoing prenatal care.  She is currently monitored for the following issues for this high-risk pregnancy and has History of low vertical cesarean section; History of preterm delivery, currently pregnant; History of placental abruption; Upper airway cough syndrome; and Supervision of high risk pregnancy in third trimester on her problem list.  Patient reports no complaints.  Contractions: Not present. Vag. Bleeding: None.  Movement: Present. Denies leaking of fluid.   The following portions of the patient's history were reviewed and updated as appropriate: allergies, current medications, past family history, past medical history, past social history, past surgical history and problem list. Problem list updated.  Objective:   Vitals:   05/05/16 1030  BP: 112/73  Pulse: 87  Weight: 240 lb (108.9 kg)    Fetal Status: Fetal Heart Rate (bpm): 140 Fundal Height: 33 cm Movement: Present     General:  Alert, oriented and cooperative. Patient is in no acute distress.  Skin: Skin is warm and dry. No rash noted.   Cardiovascular: Normal heart rate noted  Respiratory: Normal respiratory effort, no problems with respiration noted  Abdomen: Soft, gravid, appropriate for gestational age. Pain/Pressure: Absent     Pelvic:  Cervical exam deferred        Extremities: Normal range of motion.  Edema: Trace  Mental Status: Normal mood and affect. Normal behavior. Normal judgment and thought content.   Assessment and Plan:  Pregnancy: [id number] at 7163w1d  1. History of preterm delivery, currently pregnant Continue taking progesterone supplement twice daily. Patient admits to not taking it consistently  2. Supervision of high risk pregnancy in third trimester Patient is doing well She agrees to flu vaccine today - Flu Vaccine QUAD 36+ mos IM (Fluarix, Quad PF)  3. History of low vertical cesarean  section Patient still interested in TOLAC. Answered more questions for her on this topic and she desires to proceed  Preterm labor symptoms and general obstetric precautions including but not limited to vaginal bleeding, contractions, leaking of fluid and fetal movement were reviewed in detail with the patient. Please refer to After Visit Summary for other counseling recommendations.  No Follow-up on file.   Catalina AntiguaPeggy Brogan England, MD

## 2016-05-25 ENCOUNTER — Ambulatory Visit (INDEPENDENT_AMBULATORY_CARE_PROVIDER_SITE_OTHER): Payer: Medicaid Other | Admitting: Family Medicine

## 2016-05-25 ENCOUNTER — Other Ambulatory Visit (HOSPITAL_COMMUNITY)
Admission: RE | Admit: 2016-05-25 | Discharge: 2016-05-25 | Disposition: A | Discharge status: Home / Self Care | Payer: Medicaid Other | Source: Ambulatory Visit | Attending: Family Medicine | Admitting: Family Medicine

## 2016-05-25 VITALS — BP 109/75 | HR 82 | Wt 241.0 lb

## 2016-05-25 DIAGNOSIS — Z8759 Personal history of other complications of pregnancy, childbirth and the puerperium: Secondary | ICD-10-CM

## 2016-05-25 DIAGNOSIS — Z98891 History of uterine scar from previous surgery: Secondary | ICD-10-CM

## 2016-05-25 DIAGNOSIS — L309 Dermatitis, unspecified: Secondary | ICD-10-CM

## 2016-05-25 DIAGNOSIS — Z113 Encounter for screening for infections with a predominantly sexual mode of transmission: Secondary | ICD-10-CM | POA: Diagnosis not present

## 2016-05-25 DIAGNOSIS — O0993 Supervision of high risk pregnancy, unspecified, third trimester: Secondary | ICD-10-CM

## 2016-05-25 DIAGNOSIS — O34219 Maternal care for unspecified type scar from previous cesarean delivery: Secondary | ICD-10-CM

## 2016-05-25 DIAGNOSIS — O09899 Supervision of other high risk pregnancies, unspecified trimester: Secondary | ICD-10-CM

## 2016-05-25 DIAGNOSIS — O09219 Supervision of pregnancy with history of pre-term labor, unspecified trimester: Secondary | ICD-10-CM

## 2016-05-25 DIAGNOSIS — O09213 Supervision of pregnancy with history of pre-term labor, third trimester: Secondary | ICD-10-CM

## 2016-05-25 LAB — OB RESULTS CONSOLE GC/CHLAMYDIA: Gonorrhea: NEGATIVE

## 2016-05-25 LAB — OB RESULTS CONSOLE GBS: STREP GROUP B AG: NEGATIVE

## 2016-05-25 NOTE — Patient Instructions (Signed)
Breastfeeding Deciding to breastfeed is one of the best choices you can make for you and your baby. A change in hormones during pregnancy causes your breast tissue to grow and increases the number and size of your milk ducts. These hormones also allow proteins, sugars, and fats from your blood supply to make breast milk in your milk-producing glands. Hormones prevent breast milk from being released before your baby is born as well as prompt milk flow after birth. Once breastfeeding has begun, thoughts of your baby, as well as his or her sucking or crying, can stimulate the release of milk from your milk-producing glands. Benefits of breastfeeding For Your Baby  Your first milk (colostrum) helps your baby's digestive system function better.  There are antibodies in your milk that help your baby fight off infections.  Your baby has a lower incidence of asthma, allergies, and sudden infant death syndrome.  The nutrients in breast milk are better for your baby than infant formulas and are designed uniquely for your baby's needs.  Breast milk improves your baby's brain development.  Your baby is less likely to develop other conditions, such as childhood obesity, asthma, or type 2 diabetes mellitus.  For You  Breastfeeding helps to create a very special bond between you and your baby.  Breastfeeding is convenient. Breast milk is always available at the correct temperature and costs nothing.  Breastfeeding helps to burn calories and helps you lose the weight gained during pregnancy.  Breastfeeding makes your uterus contract to its prepregnancy size faster and slows bleeding (lochia) after you give birth.  Breastfeeding helps to lower your risk of developing type 2 diabetes mellitus, osteoporosis, and breast or ovarian cancer later in life.  Signs that your baby is hungry Early Signs of Hunger  Increased alertness or activity.  Stretching.  Movement of the head from side to  side.  Movement of the head and opening of the mouth when the corner of the mouth or cheek is stroked (rooting).  Increased sucking sounds, smacking lips, cooing, sighing, or squeaking.  Hand-to-mouth movements.  Increased sucking of fingers or hands.  Late Signs of Hunger  Fussing.  Intermittent crying.  Extreme Signs of Hunger Signs of extreme hunger will require calming and consoling before your baby will be able to breastfeed successfully. Do not wait for the following signs of extreme hunger to occur before you initiate breastfeeding:  Restlessness.  A loud, strong cry.  Screaming.  Breastfeeding basics Breastfeeding Initiation  Find a comfortable place to sit or lie down, with your neck and back well supported.  Place a pillow or rolled up blanket under your baby to bring him or her to the level of your breast (if you are seated). Nursing pillows are specially designed to help support your arms and your baby while you breastfeed.  Make sure that your baby's abdomen is facing your abdomen.  Gently massage your breast. With your fingertips, massage from your chest wall toward your nipple in a circular motion. This encourages milk flow. You may need to continue this action during the feeding if your milk flows slowly.  Support your breast with 4 fingers underneath and your thumb above your nipple. Make sure your fingers are well away from your nipple and your baby's mouth.  Stroke your baby's lips gently with your finger or nipple.  When your baby's mouth is open wide enough, quickly bring your baby to your breast, placing your entire nipple and as much of the colored area   around your nipple (areola) as possible into your baby's mouth. ? More areola should be visible above your baby's upper lip than below the lower lip. ? Your baby's tongue should be between his or her lower gum and your breast.  Ensure that your baby's mouth is correctly positioned around your nipple  (latched). Your baby's lips should create a seal on your breast and be turned out (everted).  It is common for your baby to suck about 2-3 minutes in order to start the flow of breast milk.  Latching Teaching your baby how to latch on to your breast properly is very important. An improper latch can cause nipple pain and decreased milk supply for you and poor weight gain in your baby. Also, if your baby is not latched onto your nipple properly, he or she may swallow some air during feeding. This can make your baby fussy. Burping your baby when you switch breasts during the feeding can help to get rid of the air. However, teaching your baby to latch on properly is still the best way to prevent fussiness from swallowing air while breastfeeding. Signs that your baby has successfully latched on to your nipple:  Silent tugging or silent sucking, without causing you pain.  Swallowing heard between every 3-4 sucks.  Muscle movement above and in front of his or her ears while sucking.  Signs that your baby has not successfully latched on to nipple:  Sucking sounds or smacking sounds from your baby while breastfeeding.  Nipple pain.  If you think your baby has not latched on correctly, slip your finger into the corner of your baby's mouth to break the suction and place it between your baby's gums. Attempt breastfeeding initiation again. Signs of Successful Breastfeeding Signs from your baby:  A gradual decrease in the number of sucks or complete cessation of sucking.  Falling asleep.  Relaxation of his or her body.  Retention of a small amount of milk in his or her mouth.  Letting go of your breast by himself or herself.  Signs from you:  Breasts that have increased in firmness, weight, and size 1-3 hours after feeding.  Breasts that are softer immediately after breastfeeding.  Increased milk volume, as well as a change in milk consistency and color by the fifth day of  breastfeeding.  Nipples that are not sore, cracked, or bleeding.  Signs That Your Baby is Getting Enough Milk  Wetting at least 1-2 diapers during the first 24 hours after birth.  Wetting at least 5-6 diapers every 24 hours for the first week after birth. The urine should be clear or pale yellow by 5 days after birth.  Wetting 6-8 diapers every 24 hours as your baby continues to grow and develop.  At least 3 stools in a 24-hour period by age 5 days. The stool should be soft and yellow.  At least 3 stools in a 24-hour period by age 7 days. The stool should be seedy and yellow.  No loss of weight greater than 10% of birth weight during the first 3 days of age.  Average weight gain of 4-7 ounces (113-198 g) per week after age 4 days.  Consistent daily weight gain by age 5 days, without weight loss after the age of 2 weeks.  After a feeding, your baby may spit up a small amount. This is common. Breastfeeding frequency and duration Frequent feeding will help you make more milk and can prevent sore nipples and breast engorgement. Breastfeed when   you feel the need to reduce the fullness of your breasts or when your baby shows signs of hunger. This is called "breastfeeding on demand." Avoid introducing a pacifier to your baby while you are working to establish breastfeeding (the first 4-6 weeks after your baby is born). After this time you may choose to use a pacifier. Research has shown that pacifier use during the first year of a baby's life decreases the risk of sudden infant death syndrome (SIDS). Allow your baby to feed on each breast as long as he or she wants. Breastfeed until your baby is finished feeding. When your baby unlatches or falls asleep while feeding from the first breast, offer the second breast. Because newborns are often sleepy in the first few weeks of life, you may need to awaken your baby to get him or her to feed. Breastfeeding times will vary from baby to baby. However,  the following rules can serve as a guide to help you ensure that your baby is properly fed:  Newborns (babies 4 weeks of age or younger) may breastfeed every 1-3 hours.  Newborns should not go longer than 3 hours during the day or 5 hours during the night without breastfeeding.  You should breastfeed your baby a minimum of 8 times in a 24-hour period until you begin to introduce solid foods to your baby at around 6 months of age.  Breast milk pumping Pumping and storing breast milk allows you to ensure that your baby is exclusively fed your breast milk, even at times when you are unable to breastfeed. This is especially important if you are going back to work while you are still breastfeeding or when you are not able to be present during feedings. Your lactation consultant can give you guidelines on how long it is safe to store breast milk. A breast pump is a machine that allows you to pump milk from your breast into a sterile bottle. The pumped breast milk can then be stored in a refrigerator or freezer. Some breast pumps are operated by hand, while others use electricity. Ask your lactation consultant which type will work best for you. Breast pumps can be purchased, but some hospitals and breastfeeding support groups lease breast pumps on a monthly basis. A lactation consultant can teach you how to hand express breast milk, if you prefer not to use a pump. Caring for your breasts while you breastfeed Nipples can become dry, cracked, and sore while breastfeeding. The following recommendations can help keep your breasts moisturized and healthy:  Avoid using soap on your nipples.  Wear a supportive bra. Although not required, special nursing bras and tank tops are designed to allow access to your breasts for breastfeeding without taking off your entire bra or top. Avoid wearing underwire-style bras or extremely tight bras.  Air dry your nipples for 3-4minutes after each feeding.  Use only cotton  bra pads to absorb leaked breast milk. Leaking of breast milk between feedings is normal.  Use lanolin on your nipples after breastfeeding. Lanolin helps to maintain your skin's normal moisture barrier. If you use pure lanolin, you do not need to wash it off before feeding your baby again. Pure lanolin is not toxic to your baby. You may also hand express a few drops of breast milk and gently massage that milk into your nipples and allow the milk to air dry.  In the first few weeks after giving birth, some women experience extremely full breasts (engorgement). Engorgement can make your   breasts feel heavy, warm, and tender to the touch. Engorgement peaks within 3-5 days after you give birth. The following recommendations can help ease engorgement:  Completely empty your breasts while breastfeeding or pumping. You may want to start by applying warm, moist heat (in the shower or with warm water-soaked hand towels) just before feeding or pumping. This increases circulation and helps the milk flow. If your baby does not completely empty your breasts while breastfeeding, pump any extra milk after he or she is finished.  Wear a snug bra (nursing or regular) or tank top for 1-2 days to signal your body to slightly decrease milk production.  Apply ice packs to your breasts, unless this is too uncomfortable for you.  Make sure that your baby is latched on and positioned properly while breastfeeding.  If engorgement persists after 48 hours of following these recommendations, contact your health care provider or a lactation consultant. Overall health care recommendations while breastfeeding  Eat healthy foods. Alternate between meals and snacks, eating 3 of each per day. Because what you eat affects your breast milk, some of the foods may make your baby more irritable than usual. Avoid eating these foods if you are sure that they are negatively affecting your baby.  Drink milk, fruit juice, and water to  satisfy your thirst (about 10 glasses a day).  Rest often, relax, and continue to take your prenatal vitamins to prevent fatigue, stress, and anemia.  Continue breast self-awareness checks.  Avoid chewing and smoking tobacco. Chemicals from cigarettes that pass into breast milk and exposure to secondhand smoke may harm your baby.  Avoid alcohol and drug use, including marijuana. Some medicines that may be harmful to your baby can pass through breast milk. It is important to ask your health care provider before taking any medicine, including all over-the-counter and prescription medicine as well as vitamin and herbal supplements. It is possible to become pregnant while breastfeeding. If birth control is desired, ask your health care provider about options that will be safe for your baby. Contact a health care provider if:  You feel like you want to stop breastfeeding or have become frustrated with breastfeeding.  You have painful breasts or nipples.  Your nipples are cracked or bleeding.  Your breasts are red, tender, or warm.  You have a swollen area on either breast.  You have a fever or chills.  You have nausea or vomiting.  You have drainage other than breast milk from your nipples.  Your breasts do not become full before feedings by the fifth day after you give birth.  You feel sad and depressed.  Your baby is too sleepy to eat well.  Your baby is having trouble sleeping.  Your baby is wetting less than 3 diapers in a 24-hour period.  Your baby has less than 3 stools in a 24-hour period.  Your baby's skin or the white part of his or her eyes becomes yellow.  Your baby is not gaining weight by 5 days of age. Get help right away if:  Your baby is overly tired (lethargic) and does not want to wake up and feed.  Your baby develops an unexplained fever. This information is not intended to replace advice given to you by your health care provider. Make sure you discuss  any questions you have with your health care provider. Document Released: 06/14/2005 Document Revised: 11/26/2015 Document Reviewed: 12/06/2012 Elsevier Interactive Patient Education  2017 [employer]  

## 2016-05-25 NOTE — Progress Notes (Signed)
   PRENATAL VISIT NOTE  Subjective:  Michele Hayden is a 25 y.o. [id number] at 6158w0d being seen today for ongoing prenatal care.  She is currently monitored for the following issues for this high-risk pregnancy and has History of low vertical cesarean section; History of preterm delivery, currently pregnant; History of placental abruption; Upper airway cough syndrome; Supervision of high risk pregnancy in third trimester; and Eczema on her problem list.  Patient reports no complaints- reports a rash on hands with known h/o eczema.  Contractions: Not present. Vag. Bleeding: None.  Movement: Present. Denies leaking of fluid.   The following portions of the patient's history were reviewed and updated as appropriate: allergies, current medications, past family history, past medical history, past social history, past surgical history and problem list. Problem list updated.  Objective:   Vitals:   05/25/16 1127  BP: 109/75  Pulse: 82  Weight: 241 lb (109.3 kg)    Fetal Status: Fetal Heart Rate (bpm): 139   Movement: Present     General:  Alert, oriented and cooperative. Patient is in no acute distress.  Skin: Skin is warm and dry. No rash noted.   Cardiovascular: Normal heart rate noted  Respiratory: Normal respiratory effort, no problems with respiration noted  Abdomen: Soft, gravid, appropriate for gestational age. Pain/Pressure: Present     Pelvic:  Cervical exam deferred        Extremities: Normal range of motion.  Edema: Trace  Mental Status: Normal mood and affect. Normal behavior. Normal judgment and thought content.   Assessment and Plan:  Pregnancy: [id number] at 8558w0d  1. History of low vertical cesarean section Desires TOLAC, reviewed today again   2. History of preterm delivery, currently pregnant  3. History of placental abruption - on prometrium  4. Supervision of high risk pregnancy in third trimester UTD  Collected GC/CT/GBS today  5. Eczema - recommended using  lotion frequently  Preterm labor symptoms and general obstetric precautions including but not limited to vaginal bleeding, contractions, leaking of fluid and fetal movement were reviewed in detail with the patient. Please refer to After Visit Summary for other counseling recommendations.   Return in about 1 week (around 06/01/2016) for Routine prenatal care.  Federico FlakeKimberly Niles Charma Mocarski, MD

## 2016-05-26 LAB — GC/CHLAMYDIA PROBE AMP (~~LOC~~) NOT AT ARMC
CHLAMYDIA, DNA PROBE: NEGATIVE
Neisseria Gonorrhea: NEGATIVE

## 2016-05-28 LAB — CULTURE, BETA STREP (GROUP B ONLY)

## 2016-06-03 ENCOUNTER — Ambulatory Visit (INDEPENDENT_AMBULATORY_CARE_PROVIDER_SITE_OTHER): Payer: Medicaid Other | Admitting: Obstetrics & Gynecology

## 2016-06-03 VITALS — BP 128/84 | HR 91 | Wt 246.0 lb

## 2016-06-03 DIAGNOSIS — O09899 Supervision of other high risk pregnancies, unspecified trimester: Secondary | ICD-10-CM

## 2016-06-03 DIAGNOSIS — O09219 Supervision of pregnancy with history of pre-term labor, unspecified trimester: Principal | ICD-10-CM

## 2016-06-03 DIAGNOSIS — O34219 Maternal care for unspecified type scar from previous cesarean delivery: Secondary | ICD-10-CM

## 2016-06-03 DIAGNOSIS — Z98891 History of uterine scar from previous surgery: Secondary | ICD-10-CM

## 2016-06-03 DIAGNOSIS — O0993 Supervision of high risk pregnancy, unspecified, third trimester: Secondary | ICD-10-CM

## 2016-06-03 DIAGNOSIS — O09213 Supervision of pregnancy with history of pre-term labor, third trimester: Secondary | ICD-10-CM

## 2016-06-03 NOTE — Progress Notes (Signed)
   PRENATAL VISIT NOTE  Subjective:  Michele Hayden is a 25 y.o. [id number] at 3961w2d being seen today for ongoing prenatal care.  She is currently monitored for the following issues for this low-risk pregnancy and has History of low vertical cesarean section; History of preterm delivery, currently pregnant; History of placental abruption; Upper airway cough syndrome; Supervision of high risk pregnancy in third trimester; and Eczema on her problem list.  Patient reports no complaints except for cold symptoms. I rec'd OTC symptomatic relief.  Contractions: Irregular. Vag. Bleeding: None.  Movement: Present. Denies leaking of fluid.   The following portions of the patient's history were reviewed and updated as appropriate: allergies, current medications, past family history, past medical history, past social history, past surgical history and problem list. Problem list updated.  Objective:   Vitals:   06/03/16 1115  BP: 128/84  Pulse: 91  Weight: 246 lb (111.6 kg)    Fetal Status: Fetal Heart Rate (bpm): 149   Movement: Present     General:  Alert, oriented and cooperative. Patient is in no acute distress.  Skin: Skin is warm and dry. No rash noted.   Cardiovascular: Normal heart rate noted  Respiratory: Normal respiratory effort, no problems with respiration noted  Abdomen: Soft, gravid, appropriate for gestational age. Pain/Pressure: Present     Pelvic:  Cervical exam deferred        Extremities: Normal range of motion.  Edema: Trace  Mental Status: Normal mood and affect. Normal behavior. Normal judgment and thought content.   Assessment and Plan:  Pregnancy: [id number] at 4861w2d  1. Supervision of high risk pregnancy in third trimester   2. History of preterm delivery, currently pregnant   3. History of low vertical cesarean section   Preterm labor symptoms and general obstetric precautions including but not limited to vaginal bleeding, contractions, leaking of fluid and fetal  movement were reviewed in detail with the patient. Please refer to After Visit Summary for other counseling recommendations.  No Follow-up on file.   Allie BossierMyra C Roxene Alviar, MD

## 2016-06-06 ENCOUNTER — Encounter (HOSPITAL_COMMUNITY): Payer: Self-pay | Admitting: Certified Nurse Midwife

## 2016-06-06 ENCOUNTER — Inpatient Hospital Stay (HOSPITAL_COMMUNITY)
Admission: AD | Admit: 2016-06-06 | Discharge: 2016-06-08 | DRG: 775 | Disposition: A | Discharge status: Home / Self Care | Payer: Medicaid Other | Source: Ambulatory Visit | Attending: Obstetrics & Gynecology | Admitting: Obstetrics & Gynecology

## 2016-06-06 DIAGNOSIS — Z8249 Family history of ischemic heart disease and other diseases of the circulatory system: Secondary | ICD-10-CM

## 2016-06-06 DIAGNOSIS — Z3A37 37 weeks gestation of pregnancy: Secondary | ICD-10-CM

## 2016-06-06 DIAGNOSIS — K219 Gastro-esophageal reflux disease without esophagitis: Secondary | ICD-10-CM | POA: Diagnosis present

## 2016-06-06 DIAGNOSIS — Z833 Family history of diabetes mellitus: Secondary | ICD-10-CM | POA: Diagnosis not present

## 2016-06-06 DIAGNOSIS — Z3493 Encounter for supervision of normal pregnancy, unspecified, third trimester: Secondary | ICD-10-CM | POA: Diagnosis present

## 2016-06-06 DIAGNOSIS — Z87891 Personal history of nicotine dependence: Secondary | ICD-10-CM

## 2016-06-06 DIAGNOSIS — O9962 Diseases of the digestive system complicating childbirth: Principal | ICD-10-CM | POA: Diagnosis present

## 2016-06-06 DIAGNOSIS — O34211 Maternal care for low transverse scar from previous cesarean delivery: Secondary | ICD-10-CM | POA: Diagnosis present

## 2016-06-06 DIAGNOSIS — O0993 Supervision of high risk pregnancy, unspecified, third trimester: Secondary | ICD-10-CM

## 2016-06-06 MED ORDER — LACTATED RINGERS IV SOLN: 500.0000 mL | INTRAVENOUS | Status: DC | PRN

## 2016-06-06 MED ORDER — OXYTOCIN BOLUS FROM INFUSION
500.0000 mL | Freq: Once | INTRAVENOUS | Status: AC
  Administered 2016-06-07: 500 mL via INTRAVENOUS

## 2016-06-06 MED ORDER — EPHEDRINE 5 MG/ML INJ
10.0000 mg | INTRAVENOUS | Status: DC | PRN
  Filled 2016-06-06: qty 4

## 2016-06-06 MED ORDER — DIPHENHYDRAMINE HCL 50 MG/ML IJ SOLN: 12.5000 mg | INTRAMUSCULAR | Status: DC | PRN

## 2016-06-06 MED ORDER — LIDOCAINE HCL (PF) 1 % IJ SOLN
30.0000 mL | INTRAMUSCULAR | Status: DC | PRN
  Filled 2016-06-06: qty 30

## 2016-06-06 MED ORDER — FENTANYL 2.5 MCG/ML BUPIVACAINE 1/10 % EPIDURAL INFUSION (WH - ANES)
14.0000 mL/h | INTRAMUSCULAR | Status: DC | PRN
  Administered 2016-06-07: 14 mL/h via EPIDURAL

## 2016-06-06 MED ORDER — ACETAMINOPHEN 325 MG PO TABS
650.0000 mg | ORAL_TABLET | ORAL | Status: DC | PRN
  Filled 2016-06-06 (×2): qty 2

## 2016-06-06 MED ORDER — SOD CITRATE-CITRIC ACID 500-334 MG/5ML PO SOLN: 30.0000 mL | ORAL | Status: DC | PRN

## 2016-06-06 MED ORDER — LACTATED RINGERS IV SOLN: 500.0000 mL | Freq: Once | INTRAVENOUS | Status: DC

## 2016-06-06 MED ORDER — PHENYLEPHRINE 40 MCG/ML (10ML) SYRINGE FOR IV PUSH (FOR BLOOD PRESSURE SUPPORT)
80.0000 ug | PREFILLED_SYRINGE | INTRAVENOUS | Status: DC | PRN
  Filled 2016-06-06: qty 5

## 2016-06-06 MED ORDER — OXYTOCIN 40 UNITS IN LACTATED RINGERS INFUSION - SIMPLE MED
2.5000 [IU]/h | INTRAVENOUS | Status: DC
  Administered 2016-06-07: 2.5 [IU]/h via INTRAVENOUS
  Filled 2016-06-06: qty 1000

## 2016-06-06 MED ORDER — LACTATED RINGERS IV SOLN: INTRAVENOUS | Status: DC

## 2016-06-06 MED ORDER — ONDANSETRON HCL 4 MG/2ML IJ SOLN: 4.0000 mg | Freq: Four times a day (QID) | INTRAMUSCULAR | Status: DC | PRN

## 2016-06-06 MED ORDER — OXYCODONE-ACETAMINOPHEN 5-325 MG PO TABS: 1.0000 | ORAL_TABLET | ORAL | Status: DC | PRN

## 2016-06-06 MED ORDER — OXYCODONE-ACETAMINOPHEN 5-325 MG PO TABS: 2.0000 | ORAL_TABLET | ORAL | Status: DC | PRN

## 2016-06-06 NOTE — [facility] Note (Signed)
Patient presents to [facility] with complaints of contractions. Patient states contractions are 4-5 minutes apart and started around 1800. Denies leaking of fluid and bleeding.

## 2016-06-06 NOTE — H&P (Signed)
LABOR AND DELIVERY ADMISSION HISTORY AND PHYSICAL NOTE  Michele A Lape is a 25 y.o. female [phone] with IUP at 914w5d by U/S presenting for SOL.  CTX began at 8AM Q12-15 min.  Did not feel water break or leakage of fluids.  CTX now Q2-3 min.   She reports positive fetal movement.  Prenatal History/Complications: Prior C-section due to placental abruption 2 years ago.   Past Medical History: Past Medical History:  Diagnosis Date  . Chlamydia infection complicating pregnancy in second trimester 04/10/2013  . GERD (gastroesophageal reflux disease)    Past Surgical History: Past Surgical History:  Procedure Laterality Date  . CESAREAN SECTION N/A 04/13/2014   Procedure: CESAREAN SECTION;  Surgeon: Tilda BurrowJohn Ferguson V, MD;  Location: [facility];  Service: Obstetrics;  Laterality: N/A;  . DILATION AND CURETTAGE OF UTERUS    . WISDOM TOOTH EXTRACTION     Obstetrical History: OB History    Gravida Para Term Preterm AB Living   5 2 1 1 2 1    SAB TAB Ectopic Multiple Live Births   1 1     2       Obstetric Comments   # abruption     Social History: Social History   Social History  . Marital status: Single    Spouse name: N/A  . Number of children: N/A  . Years of education: N/A   Occupational History  . CNA    Social History Main Topics  . Smoking status: Former Smoker    Packs/day: 0.50    Years: 7.00    Types: Cigarettes    Quit date: 06/28/2014  . Smokeless tobacco: Never Used  . Alcohol use No     Comment: occasional  . Drug use: No     Comment: y- last use as of 02/2015  . Sexual activity: Yes     Comment: currently pregnant   Other Topics Concern  . None   Social History Narrative  . None   Family History: Family History  Problem Relation Age of Onset  . Asthma Brother   . Diabetes Paternal Grandmother   . Kidney disease Paternal Grandmother   . Heart disease Paternal Grandmother   . Heart attack Paternal Grandmother   . Diabetes Maternal Aunt     Allergies: No Known Allergies  Prescriptions Prior to Admission  Medication Sig Dispense Refill Last Dose  . prenatal vitamin w/FE, FA (NATACHEW) 29-1 MG CHEW chewable tablet Chew 1 tablet by mouth daily at 12 noon.   Taking   Review of Systems   All systems reviewed and negative except as stated in HPI  Blood pressure 143/76, pulse 80, temperature 98.4 F (36.9 C), temperature source Oral, resp. rate 16, last menstrual period 09/16/2015, unknown if currently breastfeeding. General appearance: alert, cooperative and appears stated age Lungs: clear to auscultation bilaterally Heart: regular rate and rhythm Abdomen: soft, non-tender; bowel sounds normal Extremities: No calf swelling or tenderness Presentation: cephalic  Fetal monitoring:  Baseline 120s, moderate variability, +accels, no decels Uterine activity: Q2-3 min Dilation: 5.5 Effacement (%): 100 Station: -2, -3 Exam by:: Lanice ShirtsV Rogers RN   Prenatal labs: ABO, Rh: O/POS/-- (06/22 1519) Antibody: NEG (06/22 1519) Rubella: immune RPR: NON REAC (10/03 1114)  HBsAg: NEGATIVE (06/22 1519)  HIV: NONREACTIVE (10/03 1114)  GBS:   Neg 1 hr Glucola: wnl Genetic screening:  declined Anatomy US: nl  Prenatal Transfer Tool  Maternal Diabetes: No Genetic Screening: Normal Maternal Ultrasounds/Referrals: Normal Fetal Ultrasounds or other Referrals:  None Maternal Substance Abuse:  No Significant Maternal Medications:  None Significant Maternal Lab Results: None  No results found for this or any previous visit (from the past 24 hour(s)).  Patient Active Problem List   Diagnosis Date Noted  . Eczema 05/25/2016  . Supervision of high risk pregnancy in third trimester 12/18/2015  . Upper airway cough syndrome 11/13/2015  . History of preterm delivery, currently pregnant 10/08/2014  . History of placental abruption 10/08/2014  . History of low vertical cesarean section 04/18/2014   Assessment: Michele Hayden is a 25  y.o. [id number] at 2331w5d here for SOL.   #Labor: TOLAC (c-section 344yrs ago for placental abruption). Membranes intact.  Anticipate SVD.   #Pain: Epidural on request.  #FWB: Cat I #ID:  GBS Neg #MOF: Both #MOC: Nexplanon #Circ:  out  Freddrick MarchYashika Amin, MD PGY-1 06/06/2016, 10:38 PM

## 2016-06-07 ENCOUNTER — Encounter (INDEPENDENT_AMBULATORY_CARE_PROVIDER_SITE_OTHER): Payer: Medicaid Other | Admitting: *Deleted

## 2016-06-07 ENCOUNTER — Inpatient Hospital Stay (HOSPITAL_COMMUNITY): Payer: Medicaid Other | Admitting: Anesthesiology

## 2016-06-07 ENCOUNTER — Encounter (HOSPITAL_COMMUNITY): Payer: Self-pay

## 2016-06-07 DIAGNOSIS — Z3A37 37 weeks gestation of pregnancy: Secondary | ICD-10-CM

## 2016-06-07 DIAGNOSIS — O09213 Supervision of pregnancy with history of pre-term labor, third trimester: Secondary | ICD-10-CM

## 2016-06-07 LAB — CBC
HCT: 33.6 % — ABNORMAL LOW (ref 36.0–46.0)
HEMATOCRIT: 33.1 % — AB (ref 36.0–46.0)
HEMOGLOBIN: 11.2 g/dL — AB (ref 12.0–15.0)
Hemoglobin: 11.3 g/dL — ABNORMAL LOW (ref 12.0–15.0)
MCH: 28.8 pg (ref 26.0–34.0)
MCH: 29 pg (ref 26.0–34.0)
MCHC: 33.6 g/dL (ref 30.0–36.0)
MCHC: 33.8 g/dL (ref 30.0–36.0)
MCV: 85.7 fL (ref 78.0–100.0)
MCV: 85.8 fL (ref 78.0–100.0)
PLATELETS: 95 10*3/uL — AB (ref 150–400)
Platelets: 97 10*3/uL — ABNORMAL LOW (ref 150–400)
RBC: 3.92 MIL/uL (ref 3.87–5.11)
RBC: 3.86 MIL/uL — AB (ref 3.87–5.11)
RDW: 15.1 % (ref 11.5–15.5)
RDW: 15.1 % (ref 11.5–15.5)
WBC: 9.4 10*3/uL (ref 4.0–10.5)
WBC: 12.7 10*3/uL — AB (ref 4.0–10.5)

## 2016-06-07 LAB — TYPE AND SCREEN
ABO/RH(D): O POS
ANTIBODY SCREEN: NEGATIVE

## 2016-06-07 LAB — RPR: RPR: NONREACTIVE

## 2016-06-07 MED ORDER — ACETAMINOPHEN 325 MG PO TABS
650.0000 mg | ORAL_TABLET | ORAL | Status: DC | PRN
  Administered 2016-06-07 (×3): 650 mg via ORAL
  Filled 2016-06-07: qty 2

## 2016-06-07 MED ORDER — ZOLPIDEM TARTRATE 5 MG PO TABS: 5.0000 mg | ORAL_TABLET | Freq: Every evening | ORAL | Status: DC | PRN

## 2016-06-07 MED ORDER — PHENYLEPHRINE 40 MCG/ML (10ML) SYRINGE FOR IV PUSH (FOR BLOOD PRESSURE SUPPORT)
PREFILLED_SYRINGE | INTRAVENOUS | Status: AC
  Filled 2016-06-07: qty 20

## 2016-06-07 MED ORDER — TETANUS-DIPHTH-ACELL PERTUSSIS 5-2.5-18.5 LF-MCG/0.5 IM SUSP: 0.5000 mL | Freq: Once | INTRAMUSCULAR | Status: DC

## 2016-06-07 MED ORDER — FENTANYL 2.5 MCG/ML BUPIVACAINE 1/10 % EPIDURAL INFUSION (WH - ANES)
INTRAMUSCULAR | Status: AC
  Filled 2016-06-07: qty 100

## 2016-06-07 MED ORDER — COCONUT OIL OIL: 1.0000 "application " | TOPICAL_OIL | Status: DC | PRN

## 2016-06-07 MED ORDER — WITCH HAZEL-GLYCERIN EX PADS: 1.0000 "application " | MEDICATED_PAD | CUTANEOUS | Status: DC | PRN

## 2016-06-07 MED ORDER — DIPHENHYDRAMINE HCL 25 MG PO CAPS: 25.0000 mg | ORAL_CAPSULE | Freq: Four times a day (QID) | ORAL | Status: DC | PRN

## 2016-06-07 MED ORDER — SIMETHICONE 80 MG PO CHEW: 80.0000 mg | CHEWABLE_TABLET | ORAL | Status: DC | PRN

## 2016-06-07 MED ORDER — IBUPROFEN 600 MG PO TABS
600.0000 mg | ORAL_TABLET | Freq: Four times a day (QID) | ORAL | Status: DC
  Administered 2016-06-07 – 2016-06-08 (×6): 600 mg via ORAL
  Filled 2016-06-07 (×6): qty 1

## 2016-06-07 MED ORDER — DIBUCAINE 1 % RE OINT: 1.0000 "application " | TOPICAL_OINTMENT | RECTAL | Status: DC | PRN

## 2016-06-07 MED ORDER — LIDOCAINE HCL (PF) 1 % IJ SOLN
INTRAMUSCULAR | Status: DC | PRN
  Administered 2016-06-07: 3 mL via EPIDURAL
  Administered 2016-06-07: 2 mL via EPIDURAL
  Administered 2016-06-07: 5 mL via EPIDURAL

## 2016-06-07 MED ORDER — BENZOCAINE-MENTHOL 20-0.5 % EX AERO
1.0000 "application " | INHALATION_SPRAY | CUTANEOUS | Status: DC | PRN
  Administered 2016-06-08: 1 via TOPICAL
  Filled 2016-06-07: qty 56

## 2016-06-07 MED ORDER — ONDANSETRON HCL 4 MG PO TABS: 4.0000 mg | ORAL_TABLET | ORAL | Status: DC | PRN

## 2016-06-07 MED ORDER — LACTATED RINGERS IV SOLN: 500.0000 mL | Freq: Once | INTRAVENOUS | Status: DC

## 2016-06-07 MED ORDER — SENNOSIDES-DOCUSATE SODIUM 8.6-50 MG PO TABS
2.0000 | ORAL_TABLET | ORAL | Status: DC
  Administered 2016-06-07: 2 via ORAL
  Filled 2016-06-07: qty 2

## 2016-06-07 MED ORDER — PRENATAL MULTIVITAMIN CH
1.0000 | ORAL_TABLET | Freq: Every day | ORAL | Status: DC
  Administered 2016-06-07 – 2016-06-08 (×2): 1 via ORAL
  Filled 2016-06-07 (×2): qty 1

## 2016-06-07 MED ORDER — ONDANSETRON HCL 4 MG/2ML IJ SOLN: 4.0000 mg | INTRAMUSCULAR | Status: DC | PRN

## 2016-06-07 NOTE — Anesthesia Procedure Notes (Signed)
Epidural Patient location during procedure: OB Start time: 06/07/2016 12:22 AM End time: 06/07/2016 12:27 AM  Staffing Anesthesiologist: Cecile HearingURK, [place] Memmer EDWARD Performed: anesthesiologist   Preanesthetic Checklist Completed: patient identified, pre-op evaluation, timeout performed, IV checked, risks and benefits discussed and monitors and equipment checked  Epidural Patient position: sitting Prep: DuraPrep Patient monitoring: blood pressure and continuous pulse ox Approach: midline Location: L3-L4 Injection technique: LOR air  Needle:  Needle type: Tuohy  Needle gauge: 17 G Needle length: 9 cm Needle insertion depth: 9 cm Catheter size: 19 Gauge Catheter at skin depth: 14 cm Test dose: negative and Other (1% Lidocaine)  Additional Notes Patient identified.  Risk benefits discussed including failed block, incomplete pain control, headache, nerve damage, paralysis, blood pressure changes, nausea, vomiting, reactions to medication both toxic or allergic, and postpartum back pain.  Patient expressed understanding and wished to proceed.  All questions were answered.  Sterile technique used throughout procedure and epidural site dressed with sterile barrier dressing. No paresthesia or other complications noted. The patient did not experience any signs of intravascular injection such as tinnitus or metallic taste in mouth nor signs of intrathecal spread such as rapid motor block. Please see nursing notes for vital signs. Reason for block:procedure for pain

## 2016-06-07 NOTE — Anesthesia Preprocedure Evaluation (Signed)
Anesthesia Evaluation  Patient identified by MRN, date of birth, ID band Patient awake    Reviewed: Allergy & Precautions, NPO status , Patient's Chart, lab work & pertinent test results  Airway Mallampati: II  TM Distance: >3 FB Neck ROM: Full    Dental  (+) Teeth Intact, Dental Advisory Given   Pulmonary former smoker,    Pulmonary exam normal breath sounds clear to auscultation       Cardiovascular negative cardio ROS Normal cardiovascular exam Rhythm:Regular Rate:Normal     Neuro/Psych negative neurological ROS     GI/Hepatic Neg liver ROS, GERD  ,  Endo/Other  negative endocrine ROSObesity   Renal/GU negative Renal ROS     Musculoskeletal negative musculoskeletal ROS (+)   Abdominal   Peds  Hematology  (+) Blood dyscrasia (Plt 95k), ,   Anesthesia Other Findings Day of surgery medications reviewed with the patient.  Reproductive/Obstetrics (+) Pregnancy                             Anesthesia Physical Anesthesia Plan  ASA: II  Anesthesia Plan: Epidural   Post-op Pain Management:    Induction:   Airway Management Planned:   Additional Equipment:   Intra-op Plan:   Post-operative Plan:   Informed Consent: I have reviewed the patients History and Physical, chart, labs and discussed the procedure including the risks, benefits and alternatives for the proposed anesthesia with the patient or authorized representative who has indicated his/her understanding and acceptance.   Dental advisory given  Plan Discussed with:   Anesthesia Plan Comments: (Patient identified. Risks/Benefits/Options discussed with patient including but not limited to bleeding, infection, nerve damage, paralysis, failed block, incomplete pain control, headache, blood pressure changes, nausea, vomiting, reactions to medication both or allergic, itching and postpartum back pain. Confirmed with bedside  nurse the patient's most recent platelet count. Confirmed with patient that they are not currently taking any anticoagulation, have any bleeding history or any family history of bleeding disorders. Patient expressed understanding and wished to proceed. All questions were answered. )        Anesthesia Quick Evaluation

## 2016-06-07 NOTE — Lactation Note (Signed)
This note was copied from a baby's chart. Lactation Consultation Note  Baby 7.5 hours old.  P2,  Had difficulty with latching with first child and stopped bf. Baby is tongue thrusting and has cupped tongue. Mother has evert compressible nipples. Baby easily latches but pushes off after a few sucks and the re-latches.  Mother started to complain of nipple soreness. Applied #24 NS to see if it would help w/ pain.  Baby briefly latched and fell asleep. Mother seems relieved but also stated she is unsure is she wants to breastfeed but will try with NS next time. DEBP has been set up by RN.  Told mother she could pump every 3 hours if she chooses not to put baby to the breast. Offered to come back if mother calls to help with next feeding. Mom encouraged to feed baby 8-12 times/24 hours and with feeding cues.  Demonstrated how to do suck training. Mom made aware of O/P services, breastfeeding support groups, community resources, and our phone # for post-discharge questions.     Patient Name: Boy Shelia MediaBrittany Koelzer Today's Date: 06/07/2016 Reason for consult: Initial assessment   Maternal Data Has patient been taught Hand Expression?: Yes Juliette Alcide(Melinda RN) Does the patient have breastfeeding experience prior to this delivery?: Yes (difficulty bf first child)  Feeding Feeding Type: Breast Fed  LATCH Score/Interventions Latch: Repeated attempts needed to sustain latch, nipple held in mouth throughout feeding, stimulation needed to elicit sucking reflex.  Audible Swallowing: A few with stimulation  Type of Nipple: Everted at rest and after stimulation  Comfort (Breast/Nipple): Filling, red/small blisters or bruises, mild/mod discomfort  Problem noted: Mild/Moderate discomfort  Hold (Positioning): Assistance needed to correctly position infant at breast and maintain latch.  LATCH Score: 6  Lactation Tools Discussed/Used Tools: Nipple Shields Nipple shield size: 24   Consult  Status Consult Status: Follow-up Date: 06/08/16 Follow-up type: In-patient    Dahlia ByesBerkelhammer, Ruth [facility] 06/07/2016, 10:39 AM

## 2016-06-07 NOTE — Progress Notes (Signed)
UR chart review completed.  

## 2016-06-07 NOTE — Anesthesia Postprocedure Evaluation (Signed)
Anesthesia Post Note  Patient: [name] A Asleson  Procedure(s) Performed: * No procedures listed *  Patient location during evaluation: Mother Baby Anesthesia Type: Epidural Level of consciousness: awake and alert Pain management: pain level controlled Vital Signs Assessment: post-procedure vital signs reviewed and stable Respiratory status: spontaneous breathing, nonlabored ventilation and respiratory function stable Cardiovascular status: stable Postop Assessment: no headache, no backache and epidural receding Anesthetic complications: no     Last Vitals:  Vitals:   06/07/16 0545 06/07/16 0649  BP: (!) 141/75 122/67  Pulse: 78 82  Resp: 18 18  Temp: 36.7 C 36.6 C    Last Pain:  Vitals:   06/07/16 0649  TempSrc: Axillary  PainSc:    Pain Goal: Patients Stated Pain Goal: 2 (06/07/16 0545)               Junious SilkGILBERT,Delorice Bannister

## 2016-06-08 ENCOUNTER — Encounter (HOSPITAL_COMMUNITY): Payer: Self-pay | Admitting: Advanced Practice Midwife

## 2016-06-08 MED ORDER — SENNOSIDES-DOCUSATE SODIUM 8.6-50 MG PO TABS: 2.0000 | ORAL_TABLET | ORAL | 0 refills | Status: DC

## 2016-06-08 MED ORDER — IBUPROFEN 600 MG PO TABS: 600.0000 mg | ORAL_TABLET | Freq: Four times a day (QID) | ORAL | 0 refills | Status: DC

## 2016-06-08 NOTE — Discharge Summary (Signed)
OB Discharge Summary     Patient Name: Michele Hayden DOB: 12/23/1990 MRN: [id number]  Date of admission: 06/06/2016 Delivering MD: [name], [name]   Date of discharge: 06/08/2016  Admitting diagnosis: 38 WKS, CTXS Intrauterine pregnancy: 9355w6d     Secondary diagnosis:  Active Problems:   Normal labor  Additional problems: prev C/S      Discharge diagnosis: Term Pregnancy Delivered and VBAC                                                                                                Post partum procedures:None  Augmentation: None  Complications: None  Hospital course:  Onset of Labor With Vaginal Delivery     25 y.o. yo [id number] at 2855w6d was admitted in Active Labor on 06/06/2016. Patient had an uncomplicated labor course as follows:  Membrane Rupture Time/Date: 1:12 AM ,06/07/2016   Intrapartum Procedures: Episiotomy: None [1]                                         Lacerations:  None [1]  Patient had a delivery of a Viable infant. 06/07/2016  Information for the patient's newborn:  Michele Hayden, Boy Michele Hayden [[id number]][[id number]]  Delivery Method: Vaginal, Spontaneous Delivery (Filed from Delivery Summary)  VBAC  Pateint had an uncomplicated postpartum course.  She is ambulating, tolerating a regular diet, passing flatus, and urinating well. Patient is discharged home in stable condition on 06/08/16.    Physical exam Vitals:   06/07/16 0545 06/07/16 0649 06/07/16 1857 06/08/16 0650  BP: (!) 141/75 122/67 (!) 142/84 116/61  Pulse: 78 82 91 64  Resp: 18 18 20 18   Temp: 98.1 F (36.7 C) 97.8 F (36.6 C) 98.1 F (36.7 C) 98.4 F (36.9 C)  TempSrc: Axillary Axillary Oral Oral  SpO2:      Weight:      Height:       General: alert, cooperative and no distress Lochia: appropriate Uterine Fundus: firm Incision: N/A DVT Evaluation: No evidence of DVT seen on physical exam. Labs: Lab Results  Component Value Date   WBC 12.7 (H) 06/07/2016   HGB 11.2 (L) 06/07/2016   HCT  33.1 (L) 06/07/2016   MCV 85.8 06/07/2016   PLT 97 (L) 06/07/2016   CMP Latest Ref Rng & Units 08/06/2015  Glucose 65 - 99 mg/dL 93  BUN 6 - 20 mg/dL 19  Creatinine 1.300.44 - 8.651.00 mg/dL 7.840.96  Sodium 696135 - 295145 mmol/L 141  Potassium 3.5 - 5.1 mmol/L 4.3  Chloride 101 - 111 mmol/L 106  CO2 22 - 32 mmol/L 25  Calcium 8.9 - 10.3 mg/dL 9.3  Total Protein 6.0 - 8.3 g/dL -  Total Bilirubin 0.3 - 1.2 mg/dL -  Alkaline Phos 39 - 284117 U/L -  AST 0 - 37 U/L -  ALT 0 - 35 U/L -    Discharge instruction: per After Visit Summary and "Baby and Me Booklet".  After visit meds:    Medication List  TAKE these medications   ibuprofen 600 MG tablet Commonly known as:  ADVIL,MOTRIN Take 1 tablet (600 mg total) by mouth every 6 (six) hours.   prenatal multivitamin Tabs tablet Take 1 tablet by mouth daily at 12 noon.   senna-docusate 8.6-50 MG tablet Commonly known as:  Senokot-S Take 2 tablets by mouth daily. Start taking on:  06/09/2016       Diet: routine diet  Activity: Advance as tolerated. Pelvic rest for 6 weeks.   Outpatient follow up:6 weeks Follow up Appt:Future Appointments Date Time Provider Department Center  06/11/2016 9:30 AM Allie BossierMyra C Dove, MD [facility]  06/18/2016 10:30 AM [place] Bingharlie Pickens, MD [facility]  06/22/2016 10:45 AM [place] Bingharlie Pickens, MD [facility]  07/27/2016 10:45 AM Reva Boresanya S Pratt, MD [facility]   Follow up Visit:No Follow-up on file.  Postpartum contraception: Nexplanon  Newborn Data: Live born female  Birth Weight: 7 lb 6.5 oz (3359 g) APGAR: 8, 9  Baby Feeding: Bottle and Breast Disposition:home with mother   06/08/2016 Freddrick MarchYashika Amin, MD   CNM attestation I have seen and examined this patient and agree with above documentation in the resident's note.   Michele Hayden is a 25 y.o. [id number] s/p VBAC.   Pain is well controlled.  Plan for birth control is Nexplanon.  Method of Feeding: bottle  PE:  BP  116/61 (BP Location: Right Arm)   Pulse 64   Temp 98.4 F (36.9 C) (Oral)   Resp 18   Ht 5\' 7"  (1.702 m)   Wt 108.9 kg (240 lb)   LMP 09/16/2015   SpO2 100%   Breastfeeding? Unknown   BMI 37.59 kg/m  Fundus firm   Recent Labs  06/06/16 2328 06/07/16 0418  HGB 11.3* 11.2*  HCT 33.6* 33.1*     Plan: discharge today - postpartum care discussed - f/u clinic in 4-6 weeks for postpartum visit   Cam HaiSHAW, Siegfried Vieth, CNM 8:49 AM  06/08/2016

## 2016-06-08 NOTE — Discharge Instructions (Signed)

## 2016-06-08 NOTE — Lactation Note (Signed)
This note was copied from a baby's chart. Lactation Consultation Note  Patient Name: Michele Hayden Today's Date: 06/08/2016   Baby 32 hours old. Mom reports that she is grateful for the help with breastfeeding, but she has decided to give formula now. Mom reports that this is how she fed her first child, and she is just more comfortable with this method. Mom reports that her breasts were painful with first child, so provided anticipatory guidance and enc using cabbage leaves.  Maternal Data    Feeding Feeding Type: Bottle Fed - Formula Nipple Type: Slow - flow  LATCH Score/Interventions                      Lactation Tools Discussed/Used     Consult Status      Michele Hayden 06/08/2016, 11:05 AM

## 2016-06-11 ENCOUNTER — Encounter: Payer: Medicaid Other | Admitting: Obstetrics & Gynecology

## 2016-06-18 ENCOUNTER — Encounter: Payer: Medicaid Other | Admitting: Obstetrics and Gynecology

## 2016-06-20 ENCOUNTER — Inpatient Hospital Stay (HOSPITAL_COMMUNITY)
Admission: AD | Admit: 2016-06-20 | Discharge: 2016-06-20 | Disposition: A | Discharge status: Home / Self Care | Payer: Medicaid Other | Source: Ambulatory Visit | Attending: Obstetrics & Gynecology | Admitting: Obstetrics & Gynecology

## 2016-06-20 ENCOUNTER — Encounter (HOSPITAL_COMMUNITY): Payer: Self-pay

## 2016-06-20 DIAGNOSIS — R112 Nausea with vomiting, unspecified: Secondary | ICD-10-CM | POA: Diagnosis not present

## 2016-06-20 DIAGNOSIS — R0989 Other specified symptoms and signs involving the circulatory and respiratory systems: Secondary | ICD-10-CM

## 2016-06-20 DIAGNOSIS — R197 Diarrhea, unspecified: Secondary | ICD-10-CM

## 2016-06-20 DIAGNOSIS — K529 Noninfective gastroenteritis and colitis, unspecified: Secondary | ICD-10-CM

## 2016-06-20 DIAGNOSIS — O165 Unspecified maternal hypertension, complicating the puerperium: Secondary | ICD-10-CM

## 2016-06-20 DIAGNOSIS — R109 Unspecified abdominal pain: Secondary | ICD-10-CM | POA: Insufficient documentation

## 2016-06-20 DIAGNOSIS — M7989 Other specified soft tissue disorders: Secondary | ICD-10-CM | POA: Diagnosis not present

## 2016-06-20 LAB — URINALYSIS, ROUTINE W REFLEX MICROSCOPIC
Bacteria, UA: NONE SEEN
Bilirubin Urine: NEGATIVE
GLUCOSE, UA: NEGATIVE mg/dL
Ketones, ur: NEGATIVE mg/dL
Nitrite: NEGATIVE
PH: 5 (ref 5.0–8.0)
PROTEIN: NEGATIVE mg/dL
SPECIFIC GRAVITY, URINE: 1.016 (ref 1.005–1.030)

## 2016-06-20 LAB — COMPREHENSIVE METABOLIC PANEL
ALBUMIN: 3.3 g/dL — AB (ref 3.5–5.0)
ALT: 15 U/L (ref 14–54)
AST: 16 U/L (ref 15–41)
Alkaline Phosphatase: 88 U/L (ref 38–126)
Anion gap: 7 (ref 5–15)
BILIRUBIN TOTAL: 0.6 mg/dL (ref 0.3–1.2)
BUN: 12 mg/dL (ref 6–20)
CHLORIDE: 105 mmol/L (ref 101–111)
CO2: 23 mmol/L (ref 22–32)
Calcium: 8.4 mg/dL — ABNORMAL LOW (ref 8.9–10.3)
Creatinine, Ser: 0.78 mg/dL (ref 0.44–1.00)
GFR calc Af Amer: >60 mL/min (ref >60)
GFR calc non Af Amer: >60 mL/min (ref >60)
GLUCOSE: 99 mg/dL (ref 65–99)
POTASSIUM: 4.1 mmol/L (ref 3.5–5.1)
Sodium: 135 mmol/L (ref 135–145)
TOTAL PROTEIN: 6.5 g/dL (ref 6.5–8.1)

## 2016-06-20 LAB — CBC
HEMATOCRIT: 35.8 % — AB (ref 36.0–46.0)
HEMOGLOBIN: 12 g/dL (ref 12.0–15.0)
MCH: 28.7 pg (ref 26.0–34.0)
MCHC: 33.5 g/dL (ref 30.0–36.0)
MCV: 85.6 fL (ref 78.0–100.0)
Platelets: 128 10*3/uL — ABNORMAL LOW (ref 150–400)
RBC: 4.18 MIL/uL (ref 3.87–5.11)
RDW: 15.1 % (ref 11.5–15.5)
WBC: 7.5 10*3/uL (ref 4.0–10.5)

## 2016-06-20 LAB — PROTEIN / CREATININE RATIO, URINE: Creatinine, Urine: 105 mg/dL

## 2016-06-20 MED ORDER — HYDROCHLOROTHIAZIDE 25 MG PO TABS: 25.0000 mg | ORAL_TABLET | Freq: Every day | ORAL | 0 refills | Status: DC

## 2016-06-20 MED ORDER — PROMETHAZINE HCL 25 MG PO TABS: 25.0000 mg | ORAL_TABLET | Freq: Four times a day (QID) | ORAL | 2 refills | Status: DC | PRN

## 2016-06-20 MED ORDER — LACTATED RINGERS IV BOLUS (SEPSIS)
1000.0000 mL | Freq: Once | INTRAVENOUS | Status: AC
  Administered 2016-06-20: 1000 mL via INTRAVENOUS

## 2016-06-20 MED ORDER — LOPERAMIDE HCL 2 MG PO CAPS: 4.0000 mg | ORAL_CAPSULE | ORAL | 0 refills | Status: DC | PRN

## 2016-06-20 MED ORDER — LOPERAMIDE HCL 2 MG PO CAPS: 4.0000 mg | ORAL_CAPSULE | Freq: Once | ORAL | Status: DC

## 2016-06-20 NOTE — MAU Provider Note (Signed)
Chief Complaint  Patient presents with  . Diarrhea  . Nausea  . Emesis  . swelling in hands and feet  . Abdominal Pain     First Provider Initiated Contact with Patient 06/20/16 0737      S: Michele Hayden  is a 25 y.o. y.o. year old 725P2122 female at 8413 days postpartum VBAC on 06/07/16 who presents to MAU with N/V/D and abd pain since yesterday. Also concerned because she still has pedal edema that has not resolved since delivery. Had mildly elevated BP's and low Plts during labor admission and BP 145/89 in MAU today. elevated blood pressures. No Hx CHTN. Current blood pressure medication: None  Associated symptoms: Neg for Headache, vision changes, epigastric pain. Neg for fever, chills, bloody diarrhea, sick contacts.    O: Patient Vitals for the past 24 hrs:  BP Temp Temp src Pulse Resp Height Weight  06/20/16 0726 134/82 - - (!) 56 - - -  06/20/16 0718 137/81 - - (!) 54 - - -  06/20/16 0649 145/89 98 F (36.7 C) Oral (!) 56 16 - -  06/20/16 16100632 - - - - - 5\' 7"  (1.702 m) 224 lb 2 oz (101.7 kg)   General: NAD Heart: Regular rate Lungs: Normal rate and effort Abd: Soft, NT Extremities: Tr Pedal edema Neuro: 1+ deep tendon reflexes, No clonus   MAU course Orders Placed This Encounter  Procedures  . Urinalysis, Routine w reflex microscopic  . CBC  . Protein / creatinine ratio, urine  . Comprehensive metabolic panel  . Vital signs  . Insert peripheral IV   Meds ordered this encounter  Medications  . lactated ringers bolus 1,000 mL  . loperamide (IMODIUM) capsule 4 mg   Care of pt turned over to Wynelle BourgeoisMarie Anistyn Graddy, CNM at 0800.    Dorathy KinsmanVirginia Smith, CNM 06/20/2016 8:27 AM Results for orders placed or performed during the hospital encounter of 06/20/16 (from the past 24 hour(s))  CBC     Status: Abnormal   Collection Time: 06/20/16  7:35 AM  Result Value Ref Range   WBC 7.5 4.0 - 10.5 K/uL   RBC 4.18 3.87 - 5.11 MIL/uL   Hemoglobin 12.0 12.0 - 15.0 g/dL   HCT  96.035.8 (L) 45.436.0 - 46.0 %   MCV 85.6 78.0 - 100.0 fL   MCH 28.7 26.0 - 34.0 pg   MCHC 33.5 30.0 - 36.0 g/dL   RDW 09.815.1 11.911.5 - 14.715.5 %   Platelets 128 (L) 150 - 400 K/uL  Comprehensive metabolic panel     Status: Abnormal   Collection Time: 06/20/16  7:35 AM  Result Value Ref Range   Sodium 135 135 - 145 mmol/L   Potassium 4.1 3.5 - 5.1 mmol/L   Chloride 105 101 - 111 mmol/L   CO2 23 22 - 32 mmol/L   Glucose, Bld 99 65 - 99 mg/dL   BUN 12 6 - 20 mg/dL   Creatinine, Ser 8.290.78 0.44 - 1.00 mg/dL   Calcium 8.4 (L) 8.9 - 10.3 mg/dL   Total Protein 6.5 6.5 - 8.1 g/dL   Albumin 3.3 (L) 3.5 - 5.0 g/dL   AST 16 15 - 41 U/L   ALT 15 14 - 54 U/L   Alkaline Phosphatase 88 38 - 126 U/L   Total Bilirubin 0.6 0.3 - 1.2 mg/dL   GFR calc non Af Amer >60 >60 mL/min   GFR calc Af Amer >60 >60 mL/min   Anion gap 7 5 -  15  Urinalysis, Routine w reflex microscopic     Status: Abnormal   Collection Time: 06/20/16  8:42 AM  Result Value Ref Range   Color, Urine YELLOW YELLOW   APPearance HAZY (A) CLEAR   Specific Gravity, Urine 1.016 1.005 - 1.030   pH 5.0 5.0 - 8.0   Glucose, UA NEGATIVE NEGATIVE mg/dL   Hgb urine dipstick SMALL (A) NEGATIVE   Bilirubin Urine NEGATIVE NEGATIVE   Ketones, ur NEGATIVE NEGATIVE mg/dL   Protein, ur NEGATIVE NEGATIVE mg/dL   Nitrite NEGATIVE NEGATIVE   Leukocytes, UA TRACE (A) NEGATIVE   RBC / HPF 0-5 0 - 5 RBC/hpf   WBC, UA 0-5 0 - 5 WBC/hpf   Bacteria, UA NONE SEEN NONE SEEN   Squamous Epithelial / LPF 0-5 (A) NONE SEEN  Protein / creatinine ratio, urine     Status: None   Collection Time: 06/20/16  8:42 AM  Result Value Ref Range   Creatinine, Urine 105.00 mg/dL   Total Protein, Urine <6.0 mg/dL   Protein Creatinine Ratio        0.00 - 0.15 mg/mg[Cre]   Vitals:   06/20/16 0730 06/20/16 0800 06/20/16 0900 06/20/16 1054  BP: 135/89 137/87 151/90 153/93  Pulse: (!) 59 (!) 56 (!) 58 (!) 57  Resp:   17 17  Temp:   98.1 F (36.7 C) 98.1 F (36.7 C)  TempSrc:    Oral Oral  SpO2:    100%  Weight:      Height:       Discussed with Dr Jolayne Pantheronstant Will discharge home on HCTZ until postpartum visit [facility] Preeclampsia precautions  Encouraged to return here or to other Urgent Care/ED if she develops worsening of symptoms, increase in pain, fever, or other concerning symptoms.     Aviva SignsMarie L Monic Engelmann, CNM

## 2016-06-20 NOTE — MAU Note (Signed)
Initially felt constipated since delivery but didn't take any med then last night at 9:30 pm, having diarrhea x 15 times.  Vomit x 1 with nausea. Abd pain started then also.  Had salad yesterday and the dressing tasted funny.  Vag delivery on 06/07/2016.  Also concerned about swelling in hands and feet since delivery

## 2016-06-20 NOTE — Discharge Instructions (Signed)
Food Choices to Help Relieve Diarrhea, Adult When you have diarrhea, the foods you eat and your eating habits are very important. Choosing the right foods and drinks can help relieve diarrhea. Also, because diarrhea can last up to 7 days, you need to replace lost fluids and electrolytes (such as sodium, potassium, and chloride) in order to help prevent dehydration. What general guidelines do I need to follow?  Slowly drink 1 cup (8 oz) of fluid for each episode of diarrhea. If you are getting enough fluid, your urine will be clear or pale yellow.  Eat starchy foods. Some good choices include white rice, white toast, pasta, low-fiber cereal, baked potatoes (without the skin), saltine crackers, and bagels.  Avoid large servings of any cooked vegetables.  Limit fruit to two servings per day. A serving is  cup or 1 small piece.  Choose foods with less than 2 g of fiber per serving.  Limit fats to less than 8 tsp (38 g) per day.  Avoid fried foods.  Eat foods that have probiotics in them. Probiotics can be found in certain dairy products.  Avoid foods and beverages that may increase the speed at which food moves through the stomach and intestines (gastrointestinal tract). Things to avoid include:  High-fiber foods, such as dried fruit, raw fruits and vegetables, nuts, seeds, and whole grain foods.  Spicy foods and high-fat foods.  Foods and beverages sweetened with high-fructose corn syrup, honey, or sugar alcohols such as xylitol, sorbitol, and mannitol. What foods are recommended? Grains  White rice. White, [place], or pita breads (fresh or toasted), including plain rolls, buns, or bagels. White pasta. Saltine, soda, or graham crackers. Pretzels. Low-fiber cereal. Cooked cereals made with water (such as cornmeal, farina, or cream cereals). Plain muffins. Matzo. Melba toast. Zwieback. Vegetables  Potatoes (without the skin). Strained tomato and vegetable juices. Most well-cooked and canned  vegetables without seeds. Tender lettuce. Fruits  Cooked or canned applesauce, apricots, cherries, fruit cocktail, grapefruit, peaches, pears, or plums. Fresh bananas, apples without skin, cherries, grapes, cantaloupe, grapefruit, peaches, oranges, or plums. Meat and Other Protein Products  Baked or boiled chicken. Eggs. Tofu. Fish. Seafood. Smooth peanut butter. Ground or well-cooked tender beef, ham, veal, lamb, pork, or poultry. Dairy  Plain yogurt, kefir, and unsweetened liquid yogurt. Lactose-free milk, buttermilk, or soy milk. Plain hard cheese. Beverages  Sport drinks. Clear broths. Diluted fruit juices (except prune). Regular, caffeine-free sodas such as ginger ale. Water. Decaffeinated teas. Oral rehydration solutions. Sugar-free beverages not sweetened with sugar alcohols. Other  Bouillon, broth, or soups made from recommended foods. The items listed above may not be a complete list of recommended foods or beverages. Contact your dietitian for more options.  What foods are not recommended? Grains  Whole grain, whole wheat, bran, or rye breads, rolls, pastas, crackers, and cereals. Wild or brown rice. Cereals that contain more than 2 g of fiber per serving. Corn tortillas or taco shells. Cooked or dry oatmeal. Granola. Popcorn. Vegetables  Raw vegetables. Cabbage, broccoli, Brussels sprouts, artichokes, baked beans, beet greens, corn, kale, legumes, peas, sweet potatoes, and yams. Potato skins. Cooked spinach and cabbage. Fruits  Dried fruit, including raisins and dates. Raw fruits. Stewed or dried prunes. Fresh apples with skin, apricots, mangoes, pears, raspberries, and strawberries. Meat and Other Protein Products  Chunky peanut butter. Nuts and seeds. Beans and lentils. Berniece Salines. Dairy  High-fat cheeses. Milk, chocolate milk, and beverages made with milk, such as milk shakes. Cream. Ice cream. Sweets and Desserts  Sweet rolls, doughnuts, and sweet breads. Pancakes and waffles. Fats  and Oils  Butter. Cream sauces. Margarine. Salad oils. Plain salad dressings. Olives. Avocados. Beverages  Caffeinated beverages (such as coffee, tea, soda, or energy drinks). Alcoholic beverages. Fruit juices with pulp. Prune juice. Soft drinks sweetened with high-fructose corn syrup or sugar alcohols. Other  Coconut. Hot sauce. Chili powder. Mayonnaise. Gravy. Cream-based or milk-based soups. The items listed above may not be a complete list of foods and beverages to avoid. Contact your dietitian for more information.  What should I do if I become dehydrated? Diarrhea can sometimes lead to dehydration. Signs of dehydration include dark urine and dry mouth and skin. If you think you are dehydrated, you should rehydrate with an oral rehydration solution. These solutions can be purchased at pharmacies, retail stores, or online. Drink -1 cup (120-240 mL) of oral rehydration solution each time you have an episode of diarrhea. If drinking this amount makes your diarrhea worse, try drinking smaller amounts more often. For example, drink 1-3 tsp (5-15 mL) every 5-10 minutes. A general rule for staying hydrated is to drink 1-2 L of fluid per day. Talk to your health care provider about the specific amount you should be drinking each day. Drink enough fluids to keep your urine clear or pale yellow. This information is not intended to replace advice given to you by your health care provider. Make sure you discuss any questions you have with your health care provider. Document Released: 09/04/2003 Document Revised: 11/20/2015 Document Reviewed: 05/07/2013 Elsevier Interactive Patient Education  2017 Elsevier Inc. Norovirus Infection A norovirus infection is caused by exposure to a virus in a group of similar viruses (noroviruses). This type of infection causes inflammation in your stomach and intestines (gastroenteritis). Norovirus is the most common cause of gastroenteritis. It also causes food  poisoning. Anyone can get a norovirus infection. It spreads very easily (contagious). You can get it from contaminated food, water, surfaces, or other people. Norovirus is found in the stool or vomit of infected people. You can spread the infection as soon as you feel sick until 2 weeks after you recover.  Symptoms usually begin within 2 days after you become infected. Most norovirus symptoms affect the digestive system. CAUSES Norovirus infection is caused by contact with norovirus. You can catch norovirus if you:  Eat or drink something contaminated with norovirus.  Touch surfaces or objects contaminated with norovirus and then put your hand in your mouth.  Have direct contact with an infected person who has symptoms.  Share food, drink, or utensils with someone with who is sick with norovirus. SIGNS AND SYMPTOMS Symptoms of norovirus may include:  Nausea.  Vomiting.  Diarrhea.  Stomach cramps.  Fever.  Chills.  Headache.  Muscle aches.  Tiredness. DIAGNOSIS Your health care provider may suspect norovirus based on your symptoms and physical exam. Your health care provider may also test a sample of your stool or vomit for the virus.  TREATMENT There is no specific treatment for norovirus. Most people get better without treatment in about 2 days. HOME CARE INSTRUCTIONS  Replace lost fluids by drinking plenty of water or rehydration fluids containing important minerals called electrolytes. This prevents dehydration. Drink enough fluid to keep your urine clear or pale yellow.  Do not prepare food for others while you are infected. Wait at least 3 days after recovering from the illness to do that. PREVENTION   Wash your hands often, especially after using the toilet or changing a  diaper.  Wash fruits and vegetables thoroughly before preparing or serving them.  Throw out any food that a sick person may have touched.  Disinfect contaminated surfaces immediately after  someone in the household has been sick. Use a bleach-based household cleaner.  Immediately remove and wash soiled clothes or sheets. SEEK MEDICAL CARE IF:  Your vomiting, diarrhea, and stomach pain is getting worse.  Your symptoms of norovirus do not go away after 2-3 days. SEEK IMMEDIATE MEDICAL CARE IF:  You develop symptoms of dehydration that do not improve with fluid replacement. This may include:  Excessive sleepiness.  Lack of tears.  Dry mouth.  Dizziness when standing.  Weak pulse. This information is not intended to replace advice given to you by your health care provider. Make sure you discuss any questions you have with your health care provider. Document Released: 09/04/2002 Document Revised: 07/05/2014 Document Reviewed: 11/22/2013 Elsevier Interactive Patient Education  2017 [employer]

## 2016-06-22 ENCOUNTER — Encounter: Payer: Medicaid Other | Admitting: Obstetrics and Gynecology

## 2016-06-23 IMAGING — CR DG CHEST 2V
2 series · 2 of 2 positions shown · non-contrast
Comparison: 09/28/2014

CLINICAL DATA: Central chest pain intermittently for 2-3 months.
Occasional smoker.

EXAM:
CHEST  2 VIEW

[w chest pa]
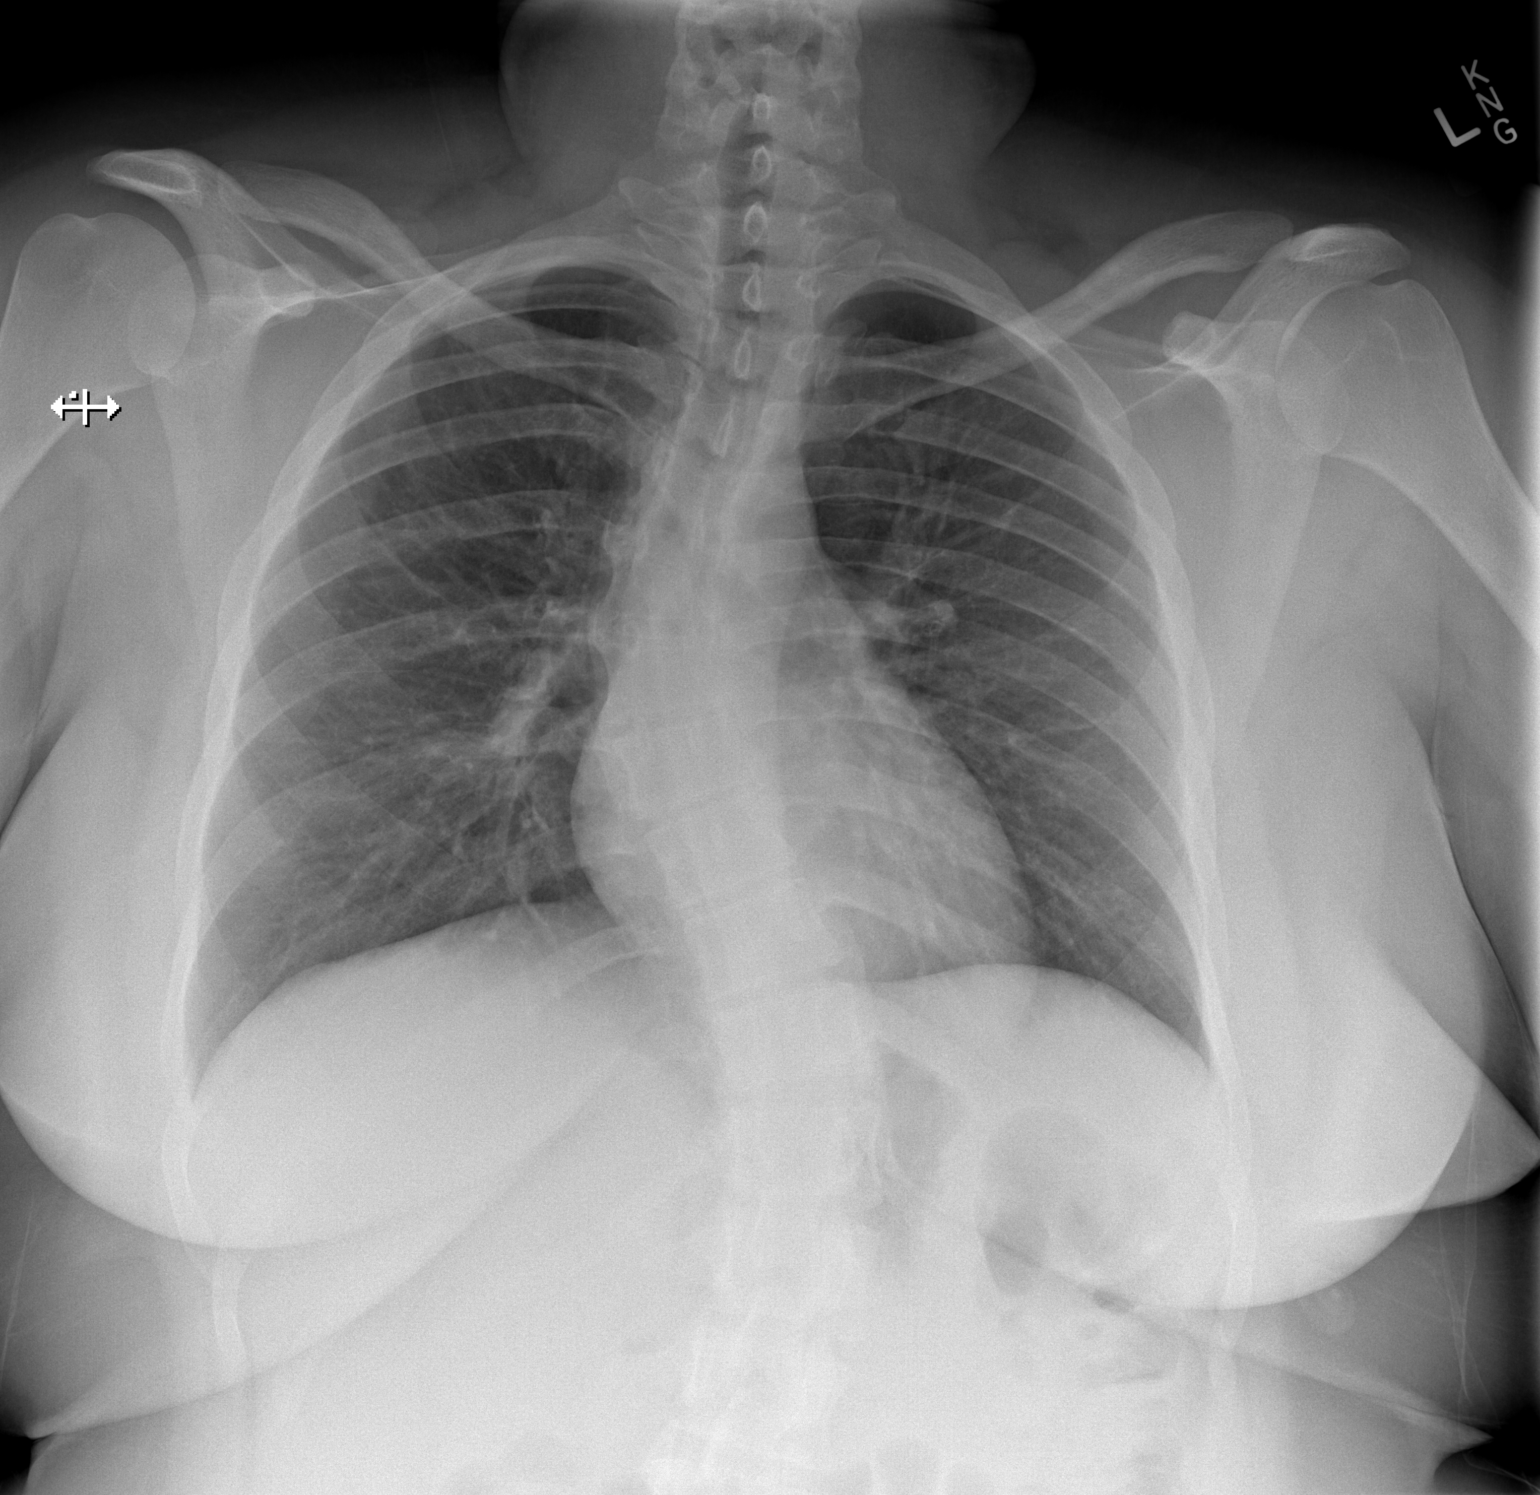

[w chest lat]
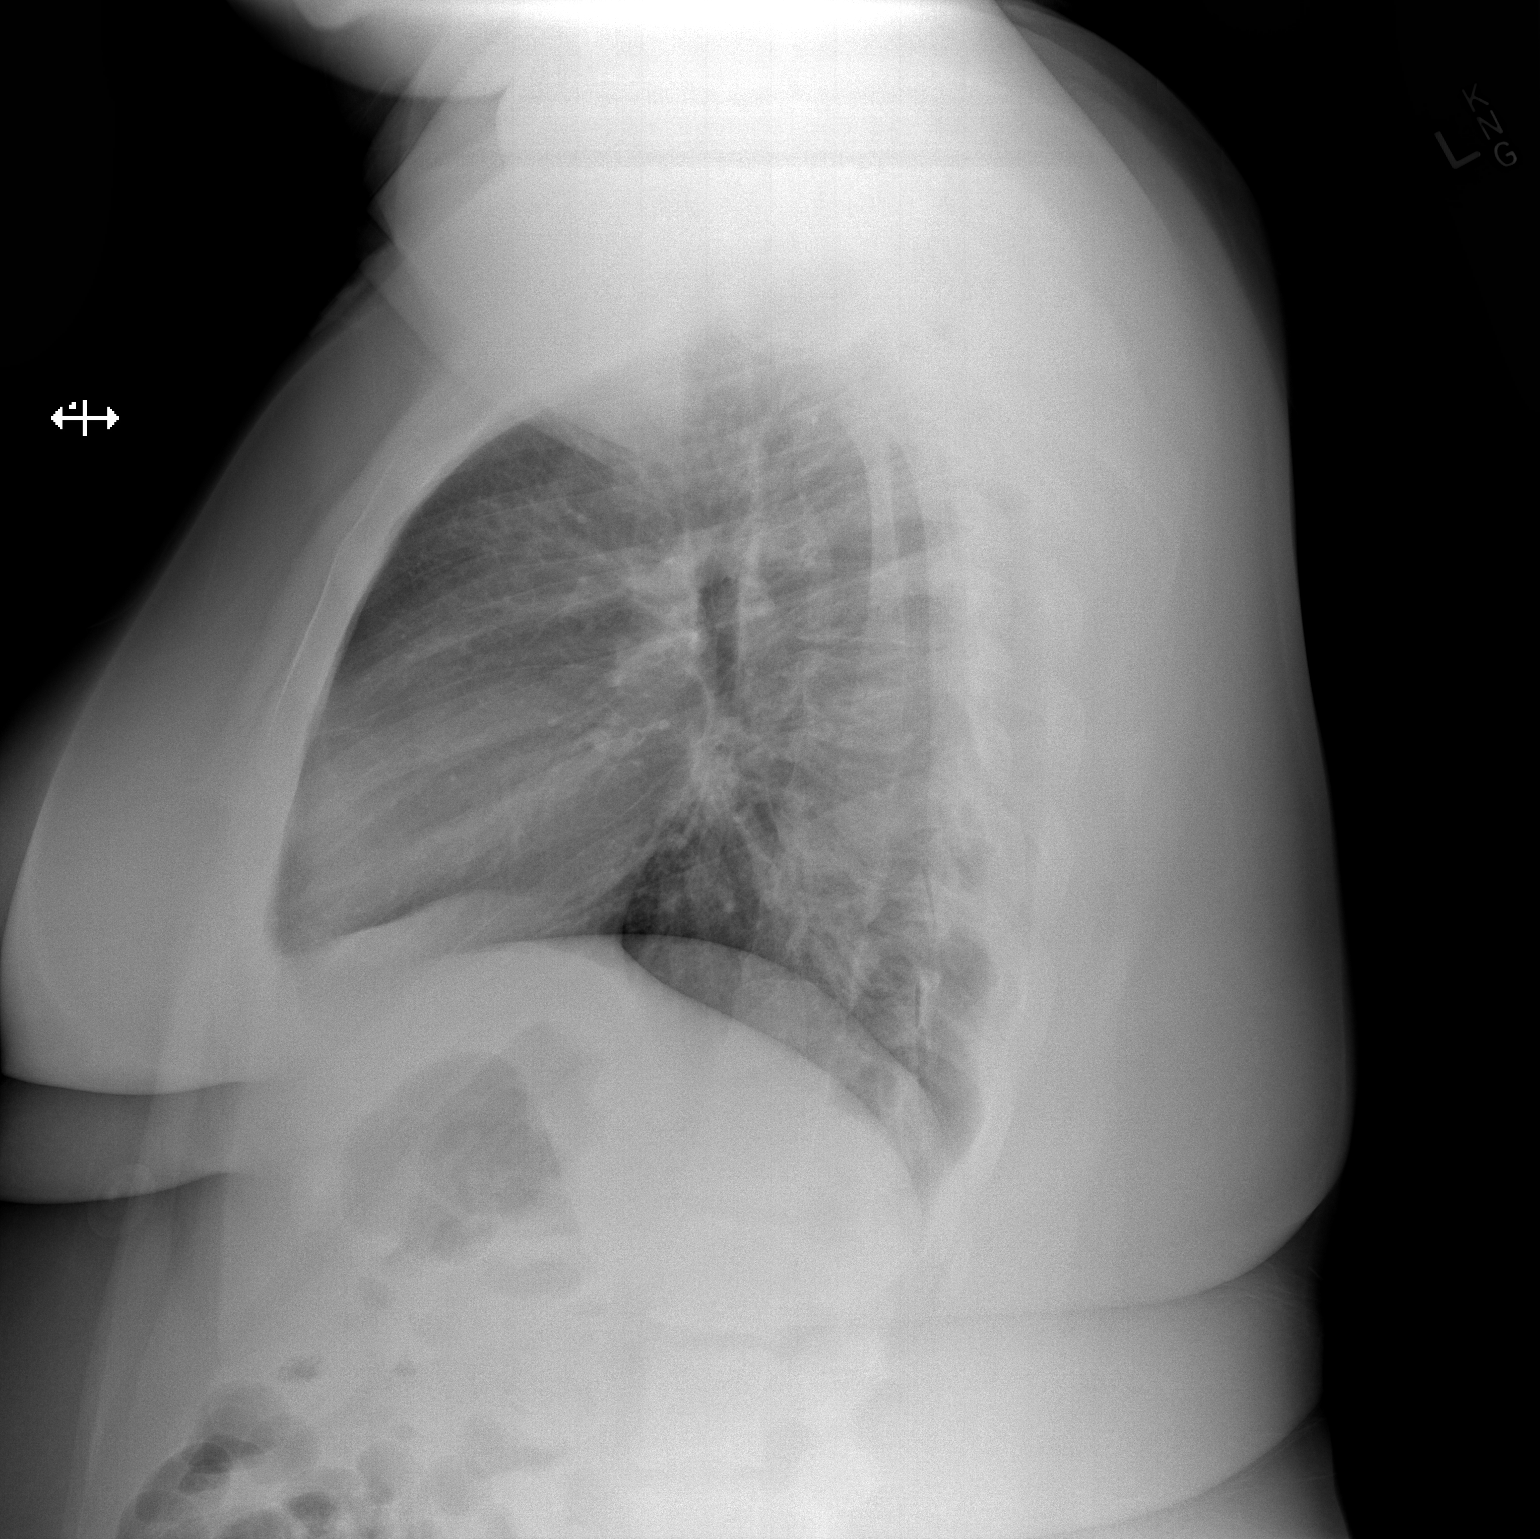

[2 of 2 positions shown; findings below may reference images not displayed]

FINDINGS: The cardiomediastinal silhouette is within normal limits. The lungs
are well inflated and clear. There is no evidence of pleural
effusion or pneumothorax. Moderate S-shaped thoracolumbar scoliosis
is again seen.
IMPRESSION: No active cardiopulmonary disease.

## 2016-07-13 ENCOUNTER — Ambulatory Visit (INDEPENDENT_AMBULATORY_CARE_PROVIDER_SITE_OTHER): Payer: Medicaid Other | Admitting: Family Medicine

## 2016-07-13 NOTE — Progress Notes (Signed)
Post Partum Exam  [name] A Hayden is a 26 y.o. [phone] female who presents for a postpartum visit. She is 5 weeks postpartum following a spontaneous vaginal delivery. I have fully reviewed the prenatal and intrapartum course. The delivery was at [id number] gestational weeks.  Anesthesia: epidural. Postpartum course has been unremarkable. Baby's course has been unremarkable. Baby is feeding by bottle - Similac Advance. Bleeding changing a panty liner 3 times a day. Bowel function is positive for constipation. Bladder function is normal. Patient is sexually active. Contraception method is condoms. Postpartum depression screening: Neg-score4  The following portions of the patient's history were reviewed and updated as appropriate: allergies, current medications, past family history, past medical history, past social history, past surgical history and problem list.  Review of Systems Pertinent items noted in HPI and remainder of comprehensive ROS otherwise negative.    Objective:    BP 116/78 mmHg  Pulse 78  Resp 16  Ht 5\' 5"  (1.651 m)  Wt 211 lb (95.709 kg)  BMI 35.11 kg/m2  Breastfeeding? Yes  General:  alert, cooperative and appears stated age  Lungs: normal effort  Heart:  regular rate and rhythm  Abdomen: soft, non-tender; bowel sounds normal; no masses,  no organomegaly        Assessment:    Normal postpartum exam. Pap smear not done at today's visit.   Plan:   1. Contraception: IUD 2. Pap to be done with IUD insertion 3. Follow up in: 2 weeks for IUD insertion or as needed.

## 2016-07-13 NOTE — Patient Instructions (Signed)

## 2016-07-15 ENCOUNTER — Encounter: Payer: Self-pay | Admitting: *Deleted

## 2016-07-27 ENCOUNTER — Ambulatory Visit: Payer: Medicaid Other | Admitting: Family Medicine

## 2016-07-28 ENCOUNTER — Telehealth: Payer: Self-pay | Admitting: *Deleted

## 2016-07-28 ENCOUNTER — Inpatient Hospital Stay (HOSPITAL_COMMUNITY): Payer: Medicaid Other

## 2016-07-28 ENCOUNTER — Encounter (HOSPITAL_COMMUNITY): Payer: Self-pay

## 2016-07-28 ENCOUNTER — Inpatient Hospital Stay (HOSPITAL_COMMUNITY)
Admission: AD | Admit: 2016-07-28 | Discharge: 2016-07-28 | Disposition: A | Discharge status: Home / Self Care | Payer: Medicaid Other | Source: Ambulatory Visit | Attending: Family Medicine | Admitting: Family Medicine

## 2016-07-28 DIAGNOSIS — R1010 Upper abdominal pain, unspecified: Secondary | ICD-10-CM | POA: Insufficient documentation

## 2016-07-28 DIAGNOSIS — K219 Gastro-esophageal reflux disease without esophagitis: Secondary | ICD-10-CM | POA: Insufficient documentation

## 2016-07-28 DIAGNOSIS — Z3202 Encounter for pregnancy test, result negative: Secondary | ICD-10-CM | POA: Insufficient documentation

## 2016-07-28 DIAGNOSIS — K5900 Constipation, unspecified: Secondary | ICD-10-CM | POA: Insufficient documentation

## 2016-07-28 DIAGNOSIS — Z87891 Personal history of nicotine dependence: Secondary | ICD-10-CM | POA: Diagnosis not present

## 2016-07-28 DIAGNOSIS — R109 Unspecified abdominal pain: Secondary | ICD-10-CM

## 2016-07-28 HISTORY — DX: Gestational (pregnancy-induced) hypertension without significant proteinuria, unspecified trimester: O13.9

## 2016-07-28 LAB — POCT PREGNANCY, URINE: Preg Test, Ur: NEGATIVE

## 2016-07-28 LAB — CBC WITH DIFFERENTIAL/PLATELET
BASOS PCT: 0 %
Basophils Absolute: 0 10*3/uL (ref 0.0–0.1)
Eosinophils Absolute: 0.3 10*3/uL (ref 0.0–0.7)
Eosinophils Relative: 3 %
HEMATOCRIT: 37.3 % (ref 36.0–46.0)
HEMOGLOBIN: 12.9 g/dL (ref 12.0–15.0)
LYMPHS ABS: 1.6 10*3/uL (ref 0.7–4.0)
Lymphocytes Relative: 20 %
MCH: 30.1 pg (ref 26.0–34.0)
MCHC: 34.6 g/dL (ref 30.0–36.0)
MCV: 87.1 fL (ref 78.0–100.0)
MONO ABS: 0.3 10*3/uL (ref 0.1–1.0)
MONOS PCT: 4 %
NEUTROS ABS: 5.5 10*3/uL (ref 1.7–7.7)
NEUTROS PCT: 73 %
Platelets: 154 10*3/uL (ref 150–400)
RBC: 4.28 MIL/uL (ref 3.87–5.11)
RDW: 15.7 % — AB (ref 11.5–15.5)
WBC: 7.7 10*3/uL (ref 4.0–10.5)

## 2016-07-28 LAB — URINALYSIS, ROUTINE W REFLEX MICROSCOPIC
Bilirubin Urine: NEGATIVE
GLUCOSE, UA: NEGATIVE mg/dL
HGB URINE DIPSTICK: NEGATIVE
KETONES UR: NEGATIVE mg/dL
LEUKOCYTES UA: NEGATIVE
Nitrite: NEGATIVE
PROTEIN: NEGATIVE mg/dL
Specific Gravity, Urine: 1.015 (ref 1.005–1.030)
pH: 6 (ref 5.0–8.0)

## 2016-07-28 LAB — COMPREHENSIVE METABOLIC PANEL
ALK PHOS: 88 U/L (ref 38–126)
ALT: 16 U/L (ref 14–54)
ANION GAP: 4 — AB (ref 5–15)
AST: 17 U/L (ref 15–41)
Albumin: 3.8 g/dL (ref 3.5–5.0)
BILIRUBIN TOTAL: 0.8 mg/dL (ref 0.3–1.2)
BUN: 21 mg/dL — ABNORMAL HIGH (ref 6–20)
CALCIUM: 8.8 mg/dL — AB (ref 8.9–10.3)
CO2: 28 mmol/L (ref 22–32)
Chloride: 103 mmol/L (ref 101–111)
Creatinine, Ser: 0.82 mg/dL (ref 0.44–1.00)
GLUCOSE: 94 mg/dL (ref 65–99)
POTASSIUM: 4.7 mmol/L (ref 3.5–5.1)
Sodium: 135 mmol/L (ref 135–145)
TOTAL PROTEIN: 7 g/dL (ref 6.5–8.1)

## 2016-07-28 MED ORDER — FLEET ENEMA 7-19 GM/118ML RE ENEM
1.0000 | ENEMA | Freq: Once | RECTAL | Status: AC
  Administered 2016-07-28: 1 via RECTAL

## 2016-07-28 MED ORDER — SIMETHICONE 80 MG PO CHEW
160.0000 mg | CHEWABLE_TABLET | Freq: Once | ORAL | Status: AC
  Administered 2016-07-28: 160 mg via ORAL
  Filled 2016-07-28: qty 2

## 2016-07-28 MED ORDER — FAMOTIDINE 20 MG PO TABS
40.0000 mg | ORAL_TABLET | Freq: Once | ORAL | Status: AC
  Administered 2016-07-28: 40 mg via ORAL
  Filled 2016-07-28: qty 2

## 2016-07-28 NOTE — MAU Provider Note (Signed)
History     CSN: [id number]  Arrival date and time: 07/28/16 6237   First Provider Initiated Contact with Patient 07/28/16 [phone]      Chief Complaint  Patient presents with  . Abdominal Pain   HPI   Michele Hayden is a 26 y.o. female [phone] here with abdominal pain and constipation.  The upper abdominal pain started yesterday. She thought it was related to constipation so she took milk of magnesium yesterday. She had several bowel movements that were very lose. The pain Is located all throughout her upper abdomen and occasionally radiates to her back. She feels her stomach is very gassy and rumbly. She was told in the past that she has GERD, however she has not seen GI and she does not take medication or follow a GERD diet. Patient had a TOLAC on 06/06/16   For dinner last night she ate spaghetti. Later that night she ate chicken noodle soup.   OB History    Gravida Para Term Preterm AB Living   5 3 2 1 2 2    SAB TAB Ectopic Multiple Live Births   1 1   0 3      Obstetric Comments   # abruption- preterm at 23.5 weeks and baby passed.  Has 2 living children now.       Past Medical History:  Diagnosis Date  . Chlamydia infection complicating pregnancy in second trimester 04/10/2013  . GERD (gastroesophageal reflux disease)   . Pregnancy induced hypertension     Past Surgical History:  Procedure Laterality Date  . CESAREAN SECTION N/A 04/13/2014   Procedure: CESAREAN SECTION;  Surgeon: Jonnie Kind, MD;  Location: [facility];  Service: Obstetrics;  Laterality: N/A;  . DILATION AND CURETTAGE OF UTERUS    . WISDOM TOOTH EXTRACTION      Family History  Problem Relation Age of Onset  . Asthma Brother   . Diabetes Paternal Grandmother   . Kidney disease Paternal Grandmother   . Heart disease Paternal Grandmother   . Heart attack Paternal Grandmother   . Diabetes Maternal Aunt     Social History  Substance Use Topics  . Smoking status: Former Smoker   Packs/day: 0.50    Years: 7.00    Types: Cigarettes    Quit date: 06/28/2014  . Smokeless tobacco: Never Used  . Alcohol use 0.0 oz/week     Comment: occasional    Allergies: No Known Allergies  Prescriptions Prior to Admission  Medication Sig Dispense Refill Last Dose  . magnesium hydroxide (MILK OF MAGNESIA) 400 MG/5ML suspension Take 30 mLs by mouth once.   07/27/2016 at Unknown time  . hydrochlorothiazide (HYDRODIURIL) 25 MG tablet Take 1 tablet (25 mg total) by mouth daily. (Patient not taking: Reported on 07/28/2016) 30 tablet 0 Not Taking at Unknown time  . ibuprofen (ADVIL,MOTRIN) 600 MG tablet Take 1 tablet (600 mg total) by mouth every 6 (six) hours. (Patient not taking: Reported on 07/28/2016) 30 tablet 0 Completed Course at Unknown time  . loperamide (IMODIUM) 2 MG capsule Take 2 capsules (4 mg total) by mouth as needed for diarrhea or loose stools. (Patient not taking: Reported on 07/28/2016) 30 capsule 0 Completed Course at Unknown time  . promethazine (PHENERGAN) 25 MG tablet Take 1 tablet (25 mg total) by mouth every 6 (six) hours as needed for nausea or vomiting. (Patient not taking: Reported on 07/28/2016) 30 tablet 2 Not Taking at Unknown time  . senna-docusate (SENOKOT-S) 8.6-50 MG tablet  Take 2 tablets by mouth daily. 60 tablet 0    Results for orders placed or performed during the hospital encounter of 07/28/16 (from the past 48 hour(s))  Urinalysis, Routine w reflex microscopic     Status: Abnormal   Collection Time: 07/28/16  8:57 AM  Result Value Ref Range   Color, Urine STRAW (A) YELLOW   APPearance CLEAR CLEAR   Specific Gravity, Urine 1.015 1.005 - 1.030   pH 6.0 5.0 - 8.0   Glucose, UA NEGATIVE NEGATIVE mg/dL   Hgb urine dipstick NEGATIVE NEGATIVE   Bilirubin Urine NEGATIVE NEGATIVE   Ketones, ur NEGATIVE NEGATIVE mg/dL   Protein, ur NEGATIVE NEGATIVE mg/dL   Nitrite NEGATIVE NEGATIVE   Leukocytes, UA NEGATIVE NEGATIVE  Pregnancy, urine POC     Status: None    Collection Time: 07/28/16  9:03 AM  Result Value Ref Range   Preg Test, Ur NEGATIVE NEGATIVE    Comment:        THE SENSITIVITY OF THIS METHODOLOGY IS >24 mIU/mL   CBC with Differential     Status: Abnormal   Collection Time: 07/28/16  9:32 AM  Result Value Ref Range   WBC 7.7 4.0 - 10.5 K/uL   RBC 4.28 3.87 - 5.11 MIL/uL   Hemoglobin 12.9 12.0 - 15.0 g/dL   HCT 37.3 36.0 - 46.0 %   MCV 87.1 78.0 - 100.0 fL   MCH 30.1 26.0 - 34.0 pg   MCHC 34.6 30.0 - 36.0 g/dL   RDW 15.7 (H) 11.5 - 15.5 %   Platelets 154 150 - 400 K/uL   Neutrophils Relative % 73 %   Neutro Abs 5.5 1.7 - 7.7 K/uL   Lymphocytes Relative 20 %   Lymphs Abs 1.6 0.7 - 4.0 K/uL   Monocytes Relative 4 %   Monocytes Absolute 0.3 0.1 - 1.0 K/uL   Eosinophils Relative 3 %   Eosinophils Absolute 0.3 0.0 - 0.7 K/uL   Basophils Relative 0 %   Basophils Absolute 0.0 0.0 - 0.1 K/uL  Comprehensive metabolic panel     Status: Abnormal   Collection Time: 07/28/16  9:32 AM  Result Value Ref Range   Sodium 135 135 - 145 mmol/L   Potassium 4.7 3.5 - 5.1 mmol/L   Chloride 103 101 - 111 mmol/L   CO2 28 22 - 32 mmol/L   Glucose, Bld 94 65 - 99 mg/dL   BUN 21 (H) 6 - 20 mg/dL   Creatinine, Ser 0.82 0.44 - 1.00 mg/dL   Calcium 8.8 (L) 8.9 - 10.3 mg/dL   Total Protein 7.0 6.5 - 8.1 g/dL   Albumin 3.8 3.5 - 5.0 g/dL   AST 17 15 - 41 U/L   ALT 16 14 - 54 U/L   Alkaline Phosphatase 88 38 - 126 U/L   Total Bilirubin 0.8 0.3 - 1.2 mg/dL   GFR calc non Af Amer >60 >60 mL/min   GFR calc Af Amer >60 >60 mL/min    Comment: (NOTE) The eGFR has been calculated using the CKD EPI equation. This calculation has not been validated in all clinical situations. eGFR's persistently <60 mL/min signify possible Chronic Kidney Disease.    Anion gap 4 (L) 5 - 15    Review of Systems  Constitutional: Negative for fever.  Eyes: Negative for visual disturbance.  Gastrointestinal: Positive for abdominal pain and nausea. Negative for vomiting.   Neurological: Negative for headaches.   Physical Exam   Blood pressure 140/88, pulse Marland Kitchen)  57, temperature 98.2 F (36.8 C), temperature source Oral, resp. rate 16, height 5' 7"  (1.702 m), weight 219 lb 1.2 oz (99.4 kg), unknown if currently breastfeeding.  Physical Exam  Constitutional: She is oriented to person, place, and time. She appears well-developed and well-nourished. No distress.  HENT:  Head: Normocephalic.  Eyes: Pupils are equal, round, and reactive to light.  GI: Soft. Bowel sounds are normal. She exhibits no distension. There is tenderness in the epigastric area. There is no rigidity, no rebound and no guarding.  Musculoskeletal: Normal range of motion.  Neurological: She is alert and oriented to person, place, and time.  Skin: Skin is warm. She is not diaphoretic.  Psychiatric: Her behavior is normal.    MAU Course  Procedures  None  MDM  UA CBC CMP Simethicone  Pepcid Fleets enema, pain down from 7/10 to 2/10 following enema  Assessment and Plan   A:  1. Constipation, unspecified constipation type   2. Abdominal pain   3. Gastroesophageal reflux disease, esophagitis presence not specified     P:  Discharge home in stable condition Over the counter meds discussed for constipation Return to MAU as needed, if symptoms worsen Follow up with PCP for possible referral to [place], NP 07/28/2016 3:38 PM

## 2016-07-28 NOTE — MAU Note (Signed)
Pt presents to MAU with upper abdominal pain. Pain started yesterday, is constant "but will peak almost like ctx." Pt thought it was due to constipation, took milk of mag and has been able to use restroom several times since. Pt states her "stomach is rumbling and she is nauseous." Denies being around anyone sick.

## 2016-07-28 NOTE — Telephone Encounter (Signed)
Spoke to pt, states she has been experiencing some constipation and took Milk of Magnesia and has had several bowel movements since then.  Now c/o severe abdominal pain that is high in her belly, [facility]

## 2016-07-28 NOTE — Telephone Encounter (Signed)
-----   Message from Lindell SparHeather L Bacon, [place] sent at 07/28/2016  8:26 AM EST ----- Regarding: severe abdominal pain Contact: [phone] Pt called to confirm appt that she has tomorrow, states that she is having severe abdominal pain and would like to have the nurse call her back as soon as she can. [facility]

## 2016-07-28 NOTE — Discharge Instructions (Signed)
Constipation, Adult Constipation is when a person:  Poops (has a bowel movement) fewer times in a week than normal.  Has a hard time pooping.  Has poop that is dry, hard, or bigger than normal. Follow these instructions at home: Eating and drinking  Eat foods that have a lot of fiber, such as:  Fresh fruits and vegetables.  Whole grains.  Beans.  Eat less of foods that are high in fat, low in fiber, or overly processed, such as:  [place] fries.  Hamburgers.  Cookies.  Candy.  Soda.  Drink enough fluid to keep your pee (urine) clear or pale yellow. General instructions  Exercise regularly or as told by your doctor.  Go to the restroom when you feel like you need to poop. Do not hold it in.  Take over-the-counter and prescription medicines only as told by your doctor. These include any fiber supplements.  Do pelvic floor retraining exercises, such as:  Doing deep breathing while relaxing your lower belly (abdomen).  Relaxing your pelvic floor while pooping.  Watch your condition for any changes.  Keep all follow-up visits as told by your doctor. This is important. Contact a doctor if:  You have pain that gets worse.  You have a fever.  You have not pooped for 4 days.  You throw up (vomit).  You are not hungry.  You lose weight.  You are bleeding from the anus.  You have thin, pencil-like poop (stool). Get help right away if:  You have a fever, and your symptoms suddenly get worse.  You leak poop or have blood in your poop.  Your belly feels hard or bigger than normal (is bloated).  You have very bad belly pain.  You feel dizzy or you faint. This information is not intended to replace advice given to you by your health care provider. Make sure you discuss any questions you have with your health care provider. Document Released: 12/01/2007 Document Revised: 01/02/2016 Document Reviewed: 12/03/2015 Elsevier Interactive Patient Education   2017 [employer] Gastroesophageal Reflux Scan A gastroesophageal reflux scan is a procedure that is used to check for gastroesophageal reflux, which is the backward flow of stomach contents into the tube that carries food from the mouth to the stomach (esophagus). The scan can also show if any stomach contents are inhaled (aspirated) into your lungs. You may need this scan if you have symptoms such as heartburn, vomiting, swallowing problems, or regurgitation. Regurgitation means that swallowed food is returning from the stomach to the esophagus. For this scan, you will drink a liquid that contains a small amount of a radioactive substance (tracer). A scanner with a camera that detects the radioactive tracer is used to see if any of the material backs up into your esophagus. Tell a health care provider about:  Any allergies you have.  All medicines you are taking, including vitamins, herbs, eye drops, creams, and over-the-counter medicines.  Any blood disorders you have.  Any surgeries you have had.  Any medical conditions you have.  If you are pregnant or you think that you may be pregnant.  If you are breastfeeding. What are the risks? Generally, this is a safe procedure. However, problems may occur, including:  Exposure to radiation (a small amount).  Allergic reaction to the radioactive substance. This is rare. What happens before the procedure?  Ask your health care provider about changing or stopping your regular medicines. This is especially important if you are taking diabetes medicines or blood  thinners.  Follow your health care providers instructions about eating or drinking restrictions. What happens during the procedure?  You will be asked to drink a liquid that contains a small amount of a radioactive tracer. This liquid will probably be similar to orange juice.  You will assume a position lying on your back.  A series of images will be taken of your esophagus and  upper stomach.  You may be asked to move into different positions to help determine if reflux occurs more often when you are in specific positions.  For adults, an abdominal binder with an inflatable cuff may be placed on the belly (abdomen). This may be used to increase abdominal pressure. More images will be taken to see if the increased pressure causes reflux to occur. The procedure may vary among health care providers and hospitals. What happens after the procedure?  Return to your normal activities and your normal diet as directed by your health care provider.  The radioactive tracer will leave your body over the next few days. Drink enough fluid to keep your urine clear or pale yellow. This will help to flush the tracer out of your body.  It is your responsibility to obtain your test results. Ask your health care provider or the department performing the test when and how you will get your results. This information is not intended to replace advice given to you by your health care provider. Make sure you discuss any questions you have with your health care provider. Document Released: 08/05/2005 Document Revised: 03/08/2016 Document Reviewed: 03/26/2014 Elsevier Interactive Patient Education  2017 [employer]. Food Choices for Gastroesophageal Reflux Disease, Adult When you have gastroesophageal reflux disease (GERD), the foods you eat and your eating habits are very important. Choosing the right foods can help ease your discomfort. What guidelines do I need to follow?  Choose fruits, vegetables, whole grains, and low-fat dairy products.  Choose low-fat meat, fish, and poultry.  Limit fats such as oils, salad dressings, butter, nuts, and avocado.  Keep a food diary. This helps you identify foods that cause symptoms.  Avoid foods that cause symptoms. These may be different for everyone.  Eat small meals often instead of 3 large meals a day.  Eat your meals slowly, in a place  where you are relaxed.  Limit fried foods.  Cook foods using methods other than frying.  Avoid drinking alcohol.  Avoid drinking large amounts of liquids with your meals.  Avoid bending over or lying down until 2-3 hours after eating. What foods are not recommended? These are some foods and drinks that may make your symptoms worse: Vegetables  Tomatoes. Tomato juice. Tomato and spaghetti sauce. Chili peppers. Onion and garlic. Horseradish. Fruits  Oranges, grapefruit, and lemon (fruit and juice). Meats  High-fat meats, fish, and poultry. This includes hot dogs, ribs, ham, sausage, salami, and bacon. Dairy  Whole milk and chocolate milk. Sour cream. Cream. Butter. Ice cream. Cream cheese. Drinks  Coffee and tea. Bubbly (carbonated) drinks or energy drinks. Condiments  Hot sauce. Barbecue sauce. Sweets/Desserts  Chocolate and cocoa. Donuts. Peppermint and spearmint. Fats and Oils  High-fat foods. This includes [place] fries and potato chips. Other  Vinegar. Strong spices. This includes black pepper, white pepper, red pepper, cayenne, curry powder, cloves, ginger, and chili powder. The items listed above may not be a complete list of foods and drinks to avoid. Contact your dietitian for more information.  This information is not intended to replace advice given to  you by your health care provider. Make sure you discuss any questions you have with your health care provider. Document Released: 12/14/2011 Document Revised: 11/20/2015 Document Reviewed: 04/18/2013 Elsevier Interactive Patient Education  2017 [employer]

## 2016-07-29 ENCOUNTER — Ambulatory Visit: Payer: Medicaid Other | Admitting: Obstetrics & Gynecology

## 2016-08-13 ENCOUNTER — Encounter: Payer: Self-pay | Admitting: *Deleted

## 2016-09-01 ENCOUNTER — Ambulatory Visit (HOSPITAL_COMMUNITY)
Admission: EM | Admit: 2016-09-01 | Discharge: 2016-09-01 | Disposition: A | Discharge status: Home / Self Care | Payer: Medicaid Other | Attending: Family Medicine | Admitting: Family Medicine

## 2016-09-01 ENCOUNTER — Encounter (HOSPITAL_COMMUNITY): Payer: Self-pay | Admitting: *Deleted

## 2016-09-01 DIAGNOSIS — R0981 Nasal congestion: Secondary | ICD-10-CM

## 2016-09-01 DIAGNOSIS — J029 Acute pharyngitis, unspecified: Secondary | ICD-10-CM

## 2016-09-01 LAB — POCT RAPID STREP A: Streptococcus, Group A Screen (Direct): POSITIVE — AB

## 2016-09-01 MED ORDER — AMOXICILLIN 500 MG PO CAPS: 1000.0000 mg | ORAL_CAPSULE | Freq: Two times a day (BID) | ORAL | 0 refills | Status: DC

## 2016-09-01 NOTE — ED Triage Notes (Signed)
Pt  Reports   Symptoms  Of  sorethroat  With  White  Patches on    Back of  Throat

## 2016-09-01 NOTE — ED Provider Notes (Signed)
CSN: [id number]     Arrival date & time 09/01/16  1441 History   First MD Initiated Contact with Patient 09/01/16 1510     Chief Complaint  Patient presents with  . Sore Throat   (Consider location/radiation/quality/duration/timing/severity/associated sxs/prior Treatment) 26 year old female complaining of upper respiratory congestion for approximately one week. Yesterday she developed sore throat. She looked in the back of her throat and saw some enlarged tonsils with white spots. Denies any fevers.      Past Medical History:  Diagnosis Date  . Chlamydia infection complicating pregnancy in second trimester 04/10/2013  . GERD (gastroesophageal reflux disease)   . Pregnancy induced hypertension    Past Surgical History:  Procedure Laterality Date  . CESAREAN SECTION N/A 04/13/2014   Procedure: CESAREAN SECTION;  Surgeon: Tilda BurrowJohn Ferguson V, MD;  Location: [facility];  Service: Obstetrics;  Laterality: N/A;  . DILATION AND CURETTAGE OF UTERUS    . WISDOM TOOTH EXTRACTION     Family History  Problem Relation Age of Onset  . Asthma Brother   . Diabetes Paternal Grandmother   . Kidney disease Paternal Grandmother   . Heart disease Paternal Grandmother   . Heart attack Paternal Grandmother   . Diabetes Maternal Aunt    Social History  Substance Use Topics  . Smoking status: Former Smoker    Packs/day: 0.50    Years: 7.00    Types: Cigarettes    Quit date: 06/28/2014  . Smokeless tobacco: Never Used  . Alcohol use 0.0 oz/week     Comment: occasional   OB History    Gravida Para Term Preterm AB Living   5 3 2 1 2 2    SAB TAB Ectopic Multiple Live Births   1 1   0 3      Obstetric Comments   # abruption- preterm at 23.5 weeks and baby passed.  Has 2 living children now.      Review of Systems  Constitutional: Positive for activity change. Negative for appetite change, chills, fatigue and fever.  HENT: Positive for congestion, postnasal drip, rhinorrhea and sore throat. Negative  for facial swelling.   Eyes: Negative.   Respiratory: Negative.   Cardiovascular: Negative.   Musculoskeletal: Negative for neck pain and neck stiffness.  Skin: Negative for pallor and rash.  Neurological: Negative.   All other systems reviewed and are negative.   Allergies  Patient has no known allergies.  Home Medications   Prior to Admission medications   Medication Sig Start Date End Date Taking? Authorizing Provider  amoxicillin (AMOXIL) 500 MG capsule Take 2 capsules (1,000 mg total) by mouth 2 (two) times daily. 09/01/16   Hayden Rasmussenavid Tishie Altmann, NP  senna-docusate (SENOKOT-S) 8.6-50 MG tablet Take 2 tablets by mouth daily. 06/09/16   Freddrick MarchYashika Amin, MD   Meds Ordered and Administered this Visit  Medications - No data to display  LMP 09/01/2016   Breastfeeding? No  No data found.   Physical Exam  Constitutional: She is oriented to person, place, and time. She appears well-developed and well-nourished. No distress.  HENT:  Head: Normocephalic and atraumatic.  Mouth/Throat: Oropharyngeal exudate present.  Bilateral TMs are normal. Oropharynx with moderate erythema, mildly enlarged palatine tonsils with erythema and gray exudates. Airway is widely patent.  Eyes: EOM are normal.  Neck: Normal range of motion. Neck supple.  Cardiovascular: Normal rate, regular rhythm, normal heart sounds and intact distal pulses.   Pulmonary/Chest: Breath sounds normal. No respiratory distress. She has no rales.  Lymphadenopathy:  She has cervical adenopathy.  Neurological: She is alert and oriented to person, place, and time.  Skin: Skin is warm and dry.  Psychiatric: She has a normal mood and affect.  Nursing note and vitals reviewed.   Urgent Care Course     Procedures (including critical care time)  Labs Review Labs Reviewed  POCT RAPID STREP A - Abnormal; Notable for the following:       Result Value   Streptococcus, Group A Screen (Direct) POSITIVE (*)    All other components within  normal limits    Imaging Review No results found.   Visual Acuity Review  Right Eye Distance:   Left Eye Distance:   Bilateral Distance:    Right Eye Near:   Left Eye Near:    Bilateral Near:         MDM   1. Exudative pharyngitis   2. Nasal congestion    Sudafed PE 10 mg every 4 to 6 hours as needed for congestion Allegra or Zyrtec daily as needed for drainage and runny nose. For stronger antihistamine may take Chlor-Trimeton 2 to 4 mg every 4 to 6 hours, may cause drowsiness. Saline nasal spray used frequently. Ibuprofen 600 mg every 6 hours as needed for pain, discomfort or fever. Drink plenty of fluids and stay well-hydrated. Flonase or Rhinocort nasal spray daily Meds ordered this encounter  Medications  . amoxicillin (AMOXIL) 500 MG capsule    Sig: Take 2 capsules (1,000 mg total) by mouth 2 (two) times daily.    Dispense:  40 capsule    Refill:  0    Order Specific Question:   Supervising Provider    Answer:   Elvina Sidle [[id number]]       Hayden Rasmussen, NP 09/01/16 1526

## 2016-09-01 NOTE — Discharge Instructions (Signed)
Sudafed PE 10 mg every 4 to 6 hours as needed for congestion °Allegra or Zyrtec daily as needed for drainage and runny nose. °For stronger antihistamine may take Chlor-Trimeton 2 to 4 mg every 4 to 6 hours, may cause drowsiness. °Saline nasal spray used frequently. °Ibuprofen 600 mg every 6 hours as needed for pain, discomfort or fever. °Drink plenty of fluids and stay well-hydrated. °Flonase or Rhinocort nasal spray daily °

## 2017-03-15 ENCOUNTER — Encounter (HOSPITAL_COMMUNITY): Payer: Self-pay | Admitting: *Deleted

## 2017-03-15 ENCOUNTER — Ambulatory Visit (HOSPITAL_COMMUNITY)
Admission: EM | Admit: 2017-03-15 | Discharge: 2017-03-15 | Disposition: A | Discharge status: Home / Self Care | Payer: Self-pay | Attending: Family Medicine | Admitting: Family Medicine

## 2017-03-15 ENCOUNTER — Emergency Department (HOSPITAL_COMMUNITY): Payer: Self-pay

## 2017-03-15 ENCOUNTER — Emergency Department (HOSPITAL_COMMUNITY)
Admission: EM | Admit: 2017-03-15 | Discharge: 2017-03-15 | Disposition: A | Discharge status: Home / Self Care | Payer: Self-pay | Attending: Emergency Medicine | Admitting: Emergency Medicine

## 2017-03-15 ENCOUNTER — Encounter (HOSPITAL_COMMUNITY): Payer: Self-pay | Admitting: Emergency Medicine

## 2017-03-15 ENCOUNTER — Other Ambulatory Visit: Payer: Self-pay

## 2017-03-15 DIAGNOSIS — R079 Chest pain, unspecified: Secondary | ICD-10-CM | POA: Insufficient documentation

## 2017-03-15 DIAGNOSIS — R0981 Nasal congestion: Secondary | ICD-10-CM | POA: Insufficient documentation

## 2017-03-15 DIAGNOSIS — Z79899 Other long term (current) drug therapy: Secondary | ICD-10-CM | POA: Insufficient documentation

## 2017-03-15 DIAGNOSIS — Z87891 Personal history of nicotine dependence: Secondary | ICD-10-CM | POA: Insufficient documentation

## 2017-03-15 DIAGNOSIS — J0100 Acute maxillary sinusitis, unspecified: Secondary | ICD-10-CM

## 2017-03-15 DIAGNOSIS — J40 Bronchitis, not specified as acute or chronic: Secondary | ICD-10-CM

## 2017-03-15 DIAGNOSIS — J029 Acute pharyngitis, unspecified: Secondary | ICD-10-CM | POA: Insufficient documentation

## 2017-03-15 DIAGNOSIS — J019 Acute sinusitis, unspecified: Secondary | ICD-10-CM | POA: Insufficient documentation

## 2017-03-15 DIAGNOSIS — R059 Cough, unspecified: Secondary | ICD-10-CM

## 2017-03-15 DIAGNOSIS — R05 Cough: Secondary | ICD-10-CM | POA: Insufficient documentation

## 2017-03-15 LAB — I-STAT CHEM 8, ED
BUN: 7 mg/dL (ref 6–20)
CHLORIDE: 103 mmol/L (ref 101–111)
CREATININE: 0.7 mg/dL (ref 0.44–1.00)
Calcium, Ion: 1.14 mmol/L — ABNORMAL LOW (ref 1.15–1.40)
GLUCOSE: 135 mg/dL — AB (ref 65–99)
HCT: 40 % (ref 36.0–46.0)
Hemoglobin: 13.6 g/dL (ref 12.0–15.0)
POTASSIUM: 3.6 mmol/L (ref 3.5–5.1)
Sodium: 138 mmol/L (ref 135–145)
TCO2: 22 mmol/L (ref 22–32)

## 2017-03-15 LAB — I-STAT BETA HCG BLOOD, ED (MC, WL, AP ONLY): I-stat hCG, quantitative: <5 m[IU]/mL (ref <5)

## 2017-03-15 LAB — I-STAT TROPONIN, ED
TROPONIN I, POC: 0 ng/mL (ref 0.00–0.08)
Troponin i, poc: 0 ng/mL (ref 0.00–0.08)

## 2017-03-15 LAB — D-DIMER, QUANTITATIVE: D-Dimer, Quant: 1.03 ug/mL-FEU — ABNORMAL HIGH (ref 0.00–0.50)

## 2017-03-15 MED ORDER — PREDNISONE 20 MG PO TABS
60.0000 mg | ORAL_TABLET | Freq: Once | ORAL | Status: AC
  Administered 2017-03-15: 60 mg via ORAL
  Filled 2017-03-15: qty 3

## 2017-03-15 MED ORDER — DOXYCYCLINE HYCLATE 100 MG PO CAPS: 100.0000 mg | ORAL_CAPSULE | Freq: Two times a day (BID) | ORAL | 0 refills | Status: AC

## 2017-03-15 MED ORDER — AEROCHAMBER PLUS FLO-VU LARGE MISC
1.0000 | Freq: Once | Status: AC
  Administered 2017-03-15: 1

## 2017-03-15 MED ORDER — CETIRIZINE HCL 10 MG PO TABS: 10.0000 mg | ORAL_TABLET | Freq: Every day | ORAL | 1 refills | Status: DC

## 2017-03-15 MED ORDER — FLUTICASONE PROPIONATE 50 MCG/ACT NA SUSP: 1.0000 | Freq: Every day | NASAL | 2 refills | Status: DC

## 2017-03-15 MED ORDER — IOPAMIDOL (ISOVUE-370) INJECTION 76%
INTRAVENOUS | Status: AC
  Filled 2017-03-15: qty 100

## 2017-03-15 MED ORDER — IPRATROPIUM BROMIDE 0.02 % IN SOLN
0.5000 mg | Freq: Once | RESPIRATORY_TRACT | Status: AC
  Administered 2017-03-15: 0.5 mg via RESPIRATORY_TRACT
  Filled 2017-03-15: qty 2.5

## 2017-03-15 MED ORDER — ALBUTEROL SULFATE (2.5 MG/3ML) 0.083% IN NEBU
5.0000 mg | INHALATION_SOLUTION | Freq: Once | RESPIRATORY_TRACT | Status: AC
  Administered 2017-03-15: 5 mg via RESPIRATORY_TRACT

## 2017-03-15 MED ORDER — ALBUTEROL SULFATE (2.5 MG/3ML) 0.083% IN NEBU
INHALATION_SOLUTION | RESPIRATORY_TRACT | Status: AC
  Filled 2017-03-15: qty 6

## 2017-03-15 MED ORDER — ALBUTEROL SULFATE (2.5 MG/3ML) 0.083% IN NEBU
5.0000 mg | INHALATION_SOLUTION | Freq: Once | RESPIRATORY_TRACT | Status: AC
  Administered 2017-03-15: 5 mg via RESPIRATORY_TRACT
  Filled 2017-03-15: qty 6

## 2017-03-15 MED ORDER — AMOXICILLIN 500 MG PO CAPS: 1000.0000 mg | ORAL_CAPSULE | Freq: Two times a day (BID) | ORAL | 0 refills | Status: DC

## 2017-03-15 MED ORDER — PREDNISONE 10 MG PO TABS: 40.0000 mg | ORAL_TABLET | Freq: Every day | ORAL | 0 refills | Status: AC

## 2017-03-15 MED ORDER — ALBUTEROL SULFATE HFA 108 (90 BASE) MCG/ACT IN AERS: 2.0000 | INHALATION_SPRAY | RESPIRATORY_TRACT | 1 refills | Status: DC | PRN

## 2017-03-15 NOTE — ED Notes (Signed)
Xray completed at [facility]

## 2017-03-15 NOTE — Discharge Instructions (Signed)
EKG and chest x-ray done last night are normal

## 2017-03-15 NOTE — ED Provider Notes (Signed)
[facility] Provider Note   CSN: [id number] Arrival date & time: 03/15/17  1508     History   Chief Complaint Chief Complaint  Patient presents with  . Shortness of Breath  . Cough    HPI Michele Hayden is a 26 y.o. female with no significant past medical history presenting with 2 days of runny nose, sore throat and productive cough that began last night. Patient states she could not breathe well and also has related anxiety which she believes has been making things worse. She was seen in the emergency department last night and left in the morning with improvement after nebulizing treatment. She then went to urgent care and was told that she had bronchitis from the previously obtained chest x-ray in the ED and was prescribed antibiotics. She reports less improvement from nebulizing treatment the second time. She then went home and felt that she couldn't breathe and her chest was hurting from coughing described as a burning radiating pain. She denies any history of DVT/PE, recent surgery, hemoptysis, leg swelling or pain, estrogen use or malignancy. She has taken one dose of antibiotics and inhaler without spacer at home today prior to returning. Denies nausea, vomiting, or other symptoms.  HPI  Past Medical History:  Diagnosis Date  . Chlamydia infection complicating pregnancy in second trimester 04/10/2013  . GERD (gastroesophageal reflux disease)   . Pregnancy induced hypertension     Patient Active Problem List   Diagnosis Date Noted  . Eczema 05/25/2016    Past Surgical History:  Procedure Laterality Date  . CESAREAN SECTION N/A 04/13/2014   Procedure: CESAREAN SECTION;  Surgeon: Tilda Burrow, MD;  Location: [facility];  Service: Obstetrics;  Laterality: N/A;  . DILATION AND CURETTAGE OF UTERUS    . WISDOM TOOTH EXTRACTION      OB History    Gravida Para Term Preterm AB Living   SAB TAB Ectopic Multiple Live Births   1 1   0 3      Obstetric  Comments   # abruption- preterm at 23.5 weeks and baby passed.  Has 2 living children now.        Home Medications    Prior to Admission medications   Medication Sig Start Date End Date Taking? Authorizing Provider  albuterol (PROVENTIL HFA;VENTOLIN HFA) 108 (90 Base) MCG/ACT inhaler Inhale 2 puffs into the lungs every 4 (four) hours as needed for wheezing or shortness of breath (cough, shortness of breath or wheezing.). 03/15/17   Elvina Sidle, MD  amoxicillin (AMOXIL) 500 MG capsule Take 2 capsules (1,000 mg total) by mouth 2 (two) times daily. 03/15/17   Elvina Sidle, MD  cetirizine (ZYRTEC) 10 MG tablet Take 1 tablet (10 mg total) by mouth daily. 03/15/17   Georgiana Shore, PA-C  doxycycline (VIBRAMYCIN) 100 MG capsule Take 1 capsule (100 mg total) by mouth 2 (two) times daily. 03/15/17 03/20/17  Mathews Robinsons B, PA-C  fluticasone (FLONASE) 50 MCG/ACT nasal spray Place 1 spray into both nostrils daily. 03/15/17   Georgiana Shore, PA-C  predniSONE (DELTASONE) 10 MG tablet Take 4 tablets (40 mg total) by mouth daily. 03/15/17 03/19/17  Mathews Robinsons B, PA-C  senna-docusate (SENOKOT-S) 8.6-50 MG tablet Take 2 tablets by mouth daily. 06/09/16   Freddrick March, MD    Family History Family History  Problem Relation Age of Onset  . Asthma Brother   . Diabetes Paternal Grandmother   . Kidney  disease Paternal Grandmother   . Heart disease Paternal Grandmother   . Heart attack Paternal Grandmother   . Diabetes Maternal Aunt     Social History Social History  Substance Use Topics  . Smoking status: Former Smoker    Packs/day: 0.50    Years: 7.00    Types: Cigarettes    Quit date: 06/28/2014  . Smokeless tobacco: Never Used  . Alcohol use 0.0 oz/week     Comment: occasional     Allergies   Patient has no known allergies.   Review of Systems Review of Systems  Constitutional: Negative for chills, diaphoresis and fever.  HENT: Positive for congestion, rhinorrhea,  sinus pressure and sore throat. Negative for ear pain, trouble swallowing and voice change.   Eyes: Negative for pain, redness and visual disturbance.  Respiratory: Positive for cough, chest tightness and shortness of breath.   Cardiovascular: Negative for chest pain, palpitations and leg swelling.  Gastrointestinal: Negative for abdominal distention, abdominal pain, diarrhea, nausea and vomiting.  Genitourinary: Negative for dysuria and hematuria.  Musculoskeletal: Negative for arthralgias, back pain, gait problem, joint swelling, myalgias, neck pain and neck stiffness.  Skin: Negative for color change, pallor and rash.  Neurological: Negative for seizures, syncope, weakness, light-headedness and headaches.     Physical Exam Updated Vital Signs BP 131/72   Pulse 92   Temp 98.6 F (37 C) (Oral)   Resp 18   LMP 02/28/2017   SpO2 97%   Physical Exam  Constitutional: She appears well-developed and well-nourished. No distress.  Patient is afebrile, nontoxic-appearing, sitting in chair in no acute distress.  HENT:  Head: Normocephalic and atraumatic.  Mouth/Throat: Oropharynx is clear and moist. No oropharyngeal exudate.  Eyes: Conjunctivae and EOM are normal. Right eye exhibits no discharge. Left eye exhibits no discharge.  Neck: Normal range of motion. Neck supple.  Cardiovascular: Normal rate, regular rhythm and normal heart sounds.   No murmur heard. Pulmonary/Chest: Effort normal. No stridor. No respiratory distress. She has wheezes.  Patient has diffuse wheezing bilaterally  Abdominal: She exhibits no distension.  Musculoskeletal: Normal range of motion. She exhibits no edema, tenderness or deformity.  Neurological: She is alert.  Skin: Skin is warm and dry. No rash noted. She is not diaphoretic. No erythema. No pallor.  Psychiatric: She has a normal mood and affect.  Nursing note and vitals reviewed.    ED Treatments / Results  Labs (all labs ordered are listed, but  only abnormal results are displayed) Labs Reviewed  D-DIMER, QUANTITATIVE (NOT AT [facility]) - Abnormal; Notable for the following:       Result Value   D-Dimer, Quant 1.03 (*)    All other components within normal limits  I-STAT CHEM 8, ED - Abnormal; Notable for the following:    Glucose, Bld 135 (*)    Calcium, Ion 1.14 (*)    All other components within normal limits  I-STAT TROPONIN, ED  I-STAT BETA HCG BLOOD, ED ([facility], [facility], [facility] ONLY)  I-STAT TROPONIN, ED    EKG  EKG Interpretation  Date/Time:  Tuesday March 15 2017 15:12:16 EDT Ventricular Rate:  86 PR Interval:  140 QRS Duration: 74 QT Interval:  348 QTC Calculation: 416 R Axis:   62 Text Interpretation:  Normal sinus rhythm with sinus arrhythmia Nonspecific T wave abnormality Abnormal ECG No significant change since last tracing Confirmed by Marily Memos [phone]) on 03/15/2017 9:26:13 PM       Radiology Dg Chest 2 View  Result Date:  03/15/2017 CLINICAL DATA:  Shortness of breath and congestion for couple of days. EXAM: CHEST  2 VIEW COMPARISON:  08/06/2015 FINDINGS: Normal heart size and pulmonary vascularity. No focal airspace disease or consolidation in the lungs. No blunting of costophrenic angles. No pneumothorax. Mediastinal contours appear intact. Thoracolumbar scoliosis. IMPRESSION: No active cardiopulmonary disease. Electronically Signed   By: Burman Nieves M.D.   On: 03/15/2017 06:20   Ct Angio Chest Pe W And/or Wo Contrast  Result Date: 03/15/2017 CLINICAL DATA:  Chest pain.  Positive D-dimer.  Shortness breath. EXAM: CT ANGIOGRAPHY CHEST WITH CONTRAST TECHNIQUE: Multidetector CT imaging of the chest was performed using the standard protocol during bolus administration of intravenous contrast. Multiplanar CT image reconstructions and MIPs were obtained to evaluate the vascular anatomy. CONTRAST:  100 mL of Isovue 370 COMPARISON:  Chest x-ray from earlier today FINDINGS: Cardiovascular: The heart is normal. The  thoracic aorta is nonaneurysmal with no atherosclerosis or dissection. Evaluation of pulmonary arteries is mildly limited due to patient body habitus. Within this limitation, no convincing pulmonary emboli are identified Mediastinum/Nodes: No enlarged mediastinal, hilar, or axillary lymph nodes. Thyroid gland, trachea, and esophagus demonstrate no significant findings. Lungs/Pleura: The central airways are normal. Clustered nodules are seen in the lingula on images 54 and 56. The nodular density in the right lower lobe on series 6, image 76 measures 13 mm. No masses or other infiltrates. Upper Abdomen: No acute abnormality. Musculoskeletal: No chest wall abnormality. No acute or significant osseous findings. Review of the MIP images confirms the above findings. IMPRESSION: 1. Evaluation for pulmonary emboli is limited as above. However, no central emboli are identified. 2. Clustered nodules in the lingula and 1 dominant nodule in the right lung are nonspecific. However, given symptoms, an infectious etiology is possible. Recommend short-term follow-up to ensure resolution. The follow-up should be performed in approximately 3 months. Electronically Signed   By: Gerome Sam III M.D   On: 03/15/2017 20:32    Procedures Procedures (including critical care time)  Medications Ordered in ED Medications  albuterol (PROVENTIL) (2.5 MG/3ML) 0.083% nebulizer solution 5 mg (5 mg Nebulization Given 03/15/17 1534)  albuterol (PROVENTIL) (2.5 MG/3ML) 0.083% nebulizer solution 5 mg (5 mg Nebulization Given 03/15/17 1740)  ipratropium (ATROVENT) nebulizer solution 0.5 mg (0.5 mg Nebulization Given 03/15/17 1740)  AEROCHAMBER PLUS FLO-VU LARGE MISC 1 each (1 each Other Given 03/15/17 1743)  predniSONE (DELTASONE) tablet 60 mg (60 mg Oral Given 03/15/17 1740)     Initial Impression / Assessment and Plan / ED Course  I have reviewed the triage vital signs and the nursing notes.  Pertinent labs & imaging results that  were available during my care of the patient were reviewed by me and considered in my medical decision making (see chart for details).    Patient presenting as a third visit in the last 24 hours for productive cough and shortness of breath. She was initially seen in the emergency department last night but left before completion of her workup.  She followed up in urgent care and got results from her chest x-ray and was prescribed antibiotics. She took one dose of her antibiotic and inhaler and return because she didn't feel it was helping.  She reports that it is causing her worsening anxiety which makes her takes shallow breaths makes things worse.  She denies any history of asthma but has problems with allergies and eczema and family history of asthma. She is not currently taking any antihistamines.  Patient was  given nebulizing treatment and prednisone and instructed on nasal spray use and antihistamines.  Due to chest tightness and shortness of breath differential includes PE, although patient is PERC negative and I have a low suspicion for this. ACS or cardiomyopathy, again low suspicion.  Heart score: 1 Troponin negative EKG without significant changes from last.  Dimer positive: CT without PE but incidental findings of nodularity. Cannot rule out infectious etiology. Follow up recommended in 3 months to ensure resolution. Discussed with patient.  Will treat patient with doxycycline for the next 5 days. Advised to discontinue amoxicillin. Patient discharged home with symptomatic relief and close follow-up with PCP.  Patient reported that she significantly improved while in the emergency department.  Discussed strict return precautions and advised to return to the emergency department if experiencing any new or worsening symptoms. Instructions were understood and patient agreed with discharge plan.  Final Clinical Impressions(s) / ED Diagnoses   Final diagnoses:  Cough  Acute  sinusitis, recurrence not specified, unspecified location    New Prescriptions Discharge Medication List as of 03/15/2017  8:55 PM    START taking these medications   Details  cetirizine (ZYRTEC) 10 MG tablet Take 1 tablet (10 mg total) by mouth daily., Starting Tue 03/15/2017, Print    doxycycline (VIBRAMYCIN) 100 MG capsule Take 1 capsule (100 mg total) by mouth 2 (two) times daily., Starting Tue 03/15/2017, Until Sun 03/20/2017, Print    fluticasone (FLONASE) 50 MCG/ACT nasal spray Place 1 spray into both nostrils daily., Starting Tue 03/15/2017, Print    predniSONE (DELTASONE) 10 MG tablet Take 4 tablets (40 mg total) by mouth daily., Starting Tue 03/15/2017, Until Sat 03/19/2017, Print         Georgiana Shore, PA-C 03/15/17 2134    Marily Memos, MD 03/15/17 1610

## 2017-03-15 NOTE — ED Notes (Signed)
Completed neb and still sob and sounds tight

## 2017-03-15 NOTE — Discharge Instructions (Signed)
As discussed, take your entire course of antibiotics even if you feel better. Prednisone for the next 4 days. Use the spacer provided with your inhaler as instructed. Use antihistamines daily and flonase as instructed.  Follow up with your Primary care provider. Follow up recommended to reevaluate your lung nodularity in 3 months. Return if symptoms worsen, chest pain, difficulty breathing, nausea, vomiting, or other new concerning symptoms in the meantime.

## 2017-03-15 NOTE — ED Triage Notes (Signed)
Pt states she was just seen here and reports she was diagnosed with bronchitis.  Pt states she feels like she cannot breath even after taking inhaler, nasal spray, and amoxicillin.  She states she feels tight in her chest.

## 2017-03-15 NOTE — ED Triage Notes (Signed)
Pt c/o chest congestion worse with coughing for the past two days.

## 2017-03-15 NOTE — ED Notes (Signed)
Pt to CT

## 2017-03-15 NOTE — ED Triage Notes (Signed)
PT reports SOB, chest congestion, productive cough for 2 days. PT went to the ER last night and had an EKG, chest xray, and breathing treatment, but had to leave to take son to school.

## 2017-03-15 NOTE — ED Provider Notes (Signed)
[facility]    CSN: [id number] Arrival date & time: 03/15/17  1103     History   Chief Complaint Chief Complaint  Patient presents with  . URI    HPI Michele Hayden is a 26 y.o. female.   PT reports SOB, chest congestion, productive cough for 2 days. PT went to the ER last night and had an EKG, chest xray, and breathing treatment, but had to leave to take son to school.   Patient works at a nursing home. She has no history of asthma and does not smoke. She is taking care of a 50-year-old boy but is not breast-feeding at the present time. She's had no fever but she does have some shortness of breath. She also has sinus congestion.      Past Medical History:  Diagnosis Date  . Chlamydia infection complicating pregnancy in second trimester 04/10/2013  . GERD (gastroesophageal reflux disease)   . Pregnancy induced hypertension     Patient Active Problem List   Diagnosis Date Noted  . Eczema 05/25/2016    Past Surgical History:  Procedure Laterality Date  . CESAREAN SECTION N/A 04/13/2014   Procedure: CESAREAN SECTION;  Surgeon: Tilda Burrow, MD;  Location: [facility];  Service: Obstetrics;  Laterality: N/A;  . DILATION AND CURETTAGE OF UTERUS    . WISDOM TOOTH EXTRACTION      OB History    Gravida Para Term Preterm AB Living   SAB TAB Ectopic Multiple Live Births   1 1   0 3      Obstetric Comments   # abruption- preterm at 23.5 weeks and baby passed.  Has 2 living children now.        Home Medications    Prior to Admission medications   Medication Sig Start Date End Date Taking? Authorizing Provider  albuterol (PROVENTIL HFA;VENTOLIN HFA) 108 (90 Base) MCG/ACT inhaler Inhale 2 puffs into the lungs every 4 (four) hours as needed for wheezing or shortness of breath (cough, shortness of breath or wheezing.). 03/15/17   Elvina Sidle, MD  amoxicillin (AMOXIL) 500 MG capsule Take 2 capsules (1,000 mg total) by mouth 2 (two) times  daily. 03/15/17   Elvina Sidle, MD  senna-docusate (SENOKOT-S) 8.6-50 MG tablet Take 2 tablets by mouth daily. 06/09/16   Freddrick March, MD    Family History Family History  Problem Relation Age of Onset  . Asthma Brother   . Diabetes Paternal Grandmother   . Kidney disease Paternal Grandmother   . Heart disease Paternal Grandmother   . Heart attack Paternal Grandmother   . Diabetes Maternal Aunt     Social History Social History  Substance Use Topics  . Smoking status: Former Smoker    Packs/day: 0.50    Years: 7.00    Types: Cigarettes    Quit date: 06/28/2014  . Smokeless tobacco: Never Used  . Alcohol use 0.0 oz/week     Comment: occasional     Allergies   Patient has no known allergies.   Review of Systems Review of Systems  HENT: Positive for congestion.   Respiratory: Positive for cough and chest tightness.   All other systems reviewed and are negative.    Physical Exam Triage Vital Signs ED Triage Vitals  Enc Vitals Group     BP 03/15/17 1127 136/86     Pulse Rate 03/15/17 1127 81     Resp 03/15/17 1127 18  Temp 03/15/17 1127 98.8 F (37.1 C)     Temp Source 03/15/17 1127 Oral     SpO2 03/15/17 1127 97 %     Weight 03/15/17 1124 220 lb (99.8 kg)     Height 03/15/17 1124  (1.702 m)     Head Circumference --      Peak Flow --      Pain Score 03/15/17 1124 7     Pain Loc --      Pain Edu? --      Excl. in GC? --    No data found.   Updated Vital Signs BP 136/86   Pulse 81   Temp 98.8 F (37.1 C) (Oral)   Resp 18   Ht  (1.702 m)   Wt 220 lb (99.8 kg)   LMP 02/28/2017   SpO2 97%   BMI 34.46 kg/m       Physical Exam  Constitutional: She is oriented to person, place, and time. She appears well-developed and well-nourished.  HENT:  Right Ear: External ear normal.  Left Ear: External ear normal.  Mouth/Throat: Oropharynx is clear and moist.  Mucopurulent discharge from nose  Eyes: Pupils are equal, round, and reactive  to light. Conjunctivae are normal.  Neck: Normal range of motion. Neck supple.  Cardiovascular: Normal rate, regular rhythm and normal heart sounds.   Pulmonary/Chest: Effort normal.  Bilateral rhonchi  Musculoskeletal: Normal range of motion.  Neurological: She is alert and oriented to person, place, and time.  Skin: Skin is warm and dry.  Nursing note and vitals reviewed.    UC Treatments / Results  Labs (all labs ordered are listed, but only abnormal results are displayed) Labs Reviewed - No data to display  EKG  Normal sinus rhythm on EKG done last night  EKG Interpretation None       Radiology Dg Chest 2 View  Result Date: 03/15/2017 CLINICAL DATA:  Shortness of breath and congestion for couple of days. EXAM: CHEST  2 VIEW COMPARISON:  08/06/2015 FINDINGS: Normal heart size and pulmonary vascularity. No focal airspace disease or consolidation in the lungs. No blunting of costophrenic angles. No pneumothorax. Mediastinal contours appear intact. Thoracolumbar scoliosis. IMPRESSION: No active cardiopulmonary disease. Electronically Signed   By: Burman Nieves M.D.   On: 03/15/2017 06:20    Procedures Procedures (including critical care time)  Medications Ordered in UC Medications - No data to display   Initial Impression / Assessment and Plan / UC Course  I have reviewed the triage vital signs and the nursing notes.  Pertinent labs & imaging results that were available during my care of the patient were reviewed by me and considered in my medical decision making (see chart for details).    Final Clinical Impressions(s) / UC Diagnoses   Final diagnoses:  Bronchitis  Acute maxillary sinusitis, recurrence not specified    New Prescriptions New Prescriptions   ALBUTEROL (PROVENTIL HFA;VENTOLIN HFA) 108 (90 BASE) MCG/ACT INHALER    Inhale 2 puffs into the lungs every 4 (four) hours as needed for wheezing or shortness of breath (cough, shortness of breath or  wheezing.).     Controlled Substance Prescriptions [place] Controlled Substance Registry consulted? Not Applicable   Elvina Sidle, MD 03/15/17 1144

## 2017-03-15 NOTE — ED Notes (Signed)
Attempted phlebotomy stick x2 without success. 

## 2017-03-17 ENCOUNTER — Telehealth: Payer: Self-pay | Admitting: *Deleted

## 2017-03-17 NOTE — Telephone Encounter (Signed)
Pt called upset because her pharmacy had not received Rx from ED as of yet.  EDCM explained that Rx was printed and presented to her at discharge.  Pt looked through paperwork again and found Rx.  Pt apologetic and states she will have Rx filled promptly.  No further EDCM needs identified.

## 2018-01-22 ENCOUNTER — Emergency Department (HOSPITAL_COMMUNITY)
Admission: EM | Admit: 2018-01-22 | Discharge: 2018-01-22 | Disposition: A | Discharge status: Home / Self Care | Payer: Self-pay | Attending: Emergency Medicine | Admitting: Emergency Medicine

## 2018-01-22 ENCOUNTER — Emergency Department (HOSPITAL_COMMUNITY): Payer: Self-pay

## 2018-01-22 ENCOUNTER — Encounter (HOSPITAL_COMMUNITY): Payer: Self-pay | Admitting: Emergency Medicine

## 2018-01-22 DIAGNOSIS — Z87891 Personal history of nicotine dependence: Secondary | ICD-10-CM | POA: Insufficient documentation

## 2018-01-22 DIAGNOSIS — R51 Headache: Secondary | ICD-10-CM | POA: Insufficient documentation

## 2018-01-22 DIAGNOSIS — M542 Cervicalgia: Secondary | ICD-10-CM | POA: Insufficient documentation

## 2018-01-22 LAB — I-STAT BETA HCG BLOOD, ED (MC, WL, AP ONLY)

## 2018-01-22 LAB — I-STAT CHEM 8, ED
BUN: 29 mg/dL — ABNORMAL HIGH (ref 6–20)
CALCIUM ION: 1.17 mmol/L (ref 1.15–1.40)
Chloride: 105 mmol/L (ref 98–111)
Creatinine, Ser: 0.9 mg/dL (ref 0.44–1.00)
Glucose, Bld: 84 mg/dL (ref 70–99)
HEMATOCRIT: 37 % (ref 36.0–46.0)
HEMOGLOBIN: 12.6 g/dL (ref 12.0–15.0)
Potassium: 4.8 mmol/L (ref 3.5–5.1)
SODIUM: 139 mmol/L (ref 135–145)
TCO2: 25 mmol/L (ref 22–32)

## 2018-01-22 MED ORDER — CYCLOBENZAPRINE HCL 10 MG PO TABS: 5.0000 mg | ORAL_TABLET | Freq: Two times a day (BID) | ORAL | 0 refills | Status: DC | PRN

## 2018-01-22 MED ORDER — MELOXICAM 15 MG PO TABS: 15.0000 mg | ORAL_TABLET | Freq: Every day | ORAL | 0 refills | Status: DC

## 2018-01-22 MED ORDER — IOPAMIDOL (ISOVUE-370) INJECTION 76%
INTRAVENOUS | Status: AC
  Administered 2018-01-22: 50 mL
  Filled 2018-01-22: qty 100
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
## 2018-01-22 NOTE — Discharge Instructions (Addendum)
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
## 2018-05-24 ENCOUNTER — Ambulatory Visit (HOSPITAL_COMMUNITY)
Admission: EM | Admit: 2018-05-24 | Discharge: 2018-05-24 | Disposition: A | Discharge status: Home / Self Care | Payer: Self-pay | Attending: Family Medicine | Admitting: Family Medicine

## 2018-05-24 ENCOUNTER — Encounter (HOSPITAL_COMMUNITY): Payer: Self-pay

## 2018-05-24 DIAGNOSIS — Z825 Family history of asthma and other chronic lower respiratory diseases: Secondary | ICD-10-CM | POA: Insufficient documentation

## 2018-05-24 DIAGNOSIS — J029 Acute pharyngitis, unspecified: Secondary | ICD-10-CM | POA: Insufficient documentation

## 2018-05-24 DIAGNOSIS — Z87891 Personal history of nicotine dependence: Secondary | ICD-10-CM | POA: Insufficient documentation

## 2018-05-24 LAB — POCT RAPID STREP A: Streptococcus, Group A Screen (Direct): NEGATIVE

## 2018-05-24 NOTE — ED Provider Notes (Signed)
[facility]   [id number] 05/24/18 Arrival Time: 1511  ASSESSMENT & PLAN:  1. Sore throat     Discharge Instructions      You may use over the counter ibuprofen or acetaminophen as needed.   For a sore throat, over the counter products such as [employer] Peroxyl Mouth Sore Rinse or Chloraseptic Sore Throat Spray may provide some temporary relief.  Your rapid strep test was negative today. We have sent your throat swab for culture and will let you know of any positive results.   Results for orders placed or performed during the hospital encounter of 05/24/18  POCT rapid strep A [facility])  Result Value Ref Range   Streptococcus, Group A Screen (Direct) NEGATIVE NEGATIVE   Labs Reviewed  CULTURE, GROUP A STREP [facility])  POCT RAPID STREP A    Reviewed expectations re: course of current medical issues. Questions answered. Outlined signs and symptoms indicating need for more acute intervention. Patient verbalized understanding. After Visit Summary given.   SUBJECTIVE:  Michele Hayden is a 27 y.o. female who reports a sore throat. Describes as pain with swallowing. Onset abrupt beginning 1 day ago. No respiratory symptoms. Mild nasal congestion; questions post-nasal drainage. Normal PO intake but reports discomfort with swallowing. Fever reported: unsure; questions subjective. No associated n/v/abdominal symptoms. Sick contacts: none known.  OTC treatment: none reported.  ROS: As per HPI.   OBJECTIVE:  Vitals:   05/24/18 1600  BP: 127/71  Pulse: 78  Resp: 16  Temp: 98.8 F (37.1 C)  TempSrc: Oral  SpO2: 100%     General appearance: alert; no distress HEENT: throat with tonsillar hypertrophy and mild erythema; uvula midline yes Neck: supple with FROM; no lymphadenopathy Lungs: clear to auscultation bilaterally Skin: reveals no rash; warm and dry Psychological: alert and cooperative; normal mood and affect  No Known Allergies  Past Medical  History:  Diagnosis Date  . Chlamydia infection complicating pregnancy in second trimester 04/10/2013  . GERD (gastroesophageal reflux disease)   . Pregnancy induced hypertension    Social History   Socioeconomic History  . Marital status: Single    Spouse name: Not on file  . Number of children: Not on file  . Years of education: Not on file  . Highest education level: Not on file  Occupational History  . Occupation: CNA  Social Needs  . Financial resource strain: Not on file  . Food insecurity:    Worry: Not on file    Inability: Not on file  . Transportation needs:    Medical: Not on file    Non-medical: Not on file  Tobacco Use  . Smoking status: Former Smoker    Packs/day: 0.50    Years: 7.00    Pack years: 3.50    Types: Cigarettes    Last attempt to quit: 06/28/2014    Years since quitting: 3.9  . Smokeless tobacco: Never Used  Substance and Sexual Activity  . Alcohol use: Yes    Alcohol/week: 0.0 standard drinks    Comment: occasional  . Drug use: No    Types: Cocaine, Marijuana    Comment: y- last use as of 02/2015  . Sexual activity: Yes    Birth control/protection: None  Lifestyle  . Physical activity:    Days per week: Not on file    Minutes per session: Not on file  . Stress: Not on file  Relationships  . Social connections:    Talks on phone: Not on  file    Gets together: Not on file    Attends religious service: Not on file    Active member of club or organization: Not on file    Attends meetings of clubs or organizations: Not on file    Relationship status: Not on file  . Intimate partner violence:    Fear of current or ex partner: Not on file    Emotionally abused: Not on file    Physically abused: Not on file    Forced sexual activity: Not on file  Other Topics Concern  . Not on file  Social History Narrative  . Not on file   Family History  Problem Relation Age of Onset  . Asthma Brother   . Diabetes Paternal Grandmother   . Kidney  disease Paternal Grandmother   . Heart disease Paternal Grandmother   . Heart attack Paternal Grandmother   . Diabetes Maternal Renato GailsAunt           Briane Birden, MD 05/24/18 [phone]

## 2018-05-24 NOTE — ED Triage Notes (Signed)
Pt present throat pain and with white patches on the back of her throat.  Very difficult to swallow.

## 2018-05-24 NOTE — Discharge Instructions (Signed)
You may use over the counter ibuprofen or acetaminophen as needed.  For a sore throat, over the counter products such as [employer] Peroxyl Mouth Sore Rinse or Chloraseptic Sore Throat Spray may provide some temporary relief. Your rapid strep test was negative today. We have sent your throat swab for culture and will let you know of any positive results. 

## 2018-05-27 ENCOUNTER — Telehealth (HOSPITAL_COMMUNITY): Payer: Self-pay | Admitting: Emergency Medicine

## 2018-05-27 LAB — CULTURE, GROUP A STREP (THRC)

## 2018-05-27 NOTE — Telephone Encounter (Signed)
Culture is positive for non group A Strep germ.  This is a finding of uncertain significance; not the typical 'strep throat' germ.  Pt states she is feeling better, educated on following up with PCP if symptoms do not improve. Verbalized understanding, all questions answered.

## 2018-09-11 ENCOUNTER — Other Ambulatory Visit: Payer: Self-pay

## 2018-09-11 ENCOUNTER — Ambulatory Visit (INDEPENDENT_AMBULATORY_CARE_PROVIDER_SITE_OTHER): Payer: BLUE CROSS/BLUE SHIELD | Admitting: Obstetrics and Gynecology

## 2018-09-11 ENCOUNTER — Encounter: Payer: Self-pay | Admitting: Obstetrics and Gynecology

## 2018-09-11 VITALS — BP 128/82 | HR 62 | Ht 67.0 in | Wt 219.0 lb

## 2018-09-11 DIAGNOSIS — Z124 Encounter for screening for malignant neoplasm of cervix: Secondary | ICD-10-CM | POA: Diagnosis not present

## 2018-09-11 DIAGNOSIS — B3731 Acute candidiasis of vulva and vagina: Secondary | ICD-10-CM | POA: Insufficient documentation

## 2018-09-11 DIAGNOSIS — B373 Candidiasis of vulva and vagina: Secondary | ICD-10-CM | POA: Insufficient documentation

## 2018-09-11 DIAGNOSIS — Z01419 Encounter for gynecological examination (general) (routine) without abnormal findings: Secondary | ICD-10-CM

## 2018-09-11 MED ORDER — FLUCONAZOLE 150 MG PO TABS: ORAL_TABLET | ORAL | 0 refills | Status: DC

## 2018-09-11 MED ORDER — METRONIDAZOLE 500 MG PO TABS: 500.0000 mg | ORAL_TABLET | Freq: Two times a day (BID) | ORAL | 0 refills | Status: AC

## 2018-09-11 NOTE — Progress Notes (Signed)
Obstetrics and Gynecology New Patient Evaluation  Appointment Date: 09/11/2018  OBGYN Clinic: [facility]  Primary Care Provider: Fleet Contras  Referring Provider: Fleet Contras, MD  Chief Complaint:  Chief Complaint  Patient presents with  . Gynecologic Exam  . Vaginitis  Annual exam  History of Present Illness: Michele Hayden is a 28 y.o. African-American [id number] (Patient's last menstrual period was 08/22/2018 (exact date).), seen for the above chief complaint.   Patient with 3 month history of persistent yeats infection. Patient went to HD and was given a PO medication and states it helped some but it never went away. She hasn't tried anything OTC. No prior h/o yeast issues in the past. Doesn't use tampons consistently and no douches and uses condoms and PRN lubricants rarely.   Pt has been exercising a lot recently and notes increased s/s when doing the ketogenic diet.   No breast s/s,  abdominal pain, dysuria, hematuria, vaginal itching, dyspareunia,diarrhea, constipation, blood in BMs  Review of Systems: as noted in the History of Present Illness.   Past Medical History:  Past Medical History:  Diagnosis Date  . Chlamydia infection complicating pregnancy in second trimester 04/10/2013  . GERD (gastroesophageal reflux disease)   . Pregnancy induced hypertension     Past Surgical History:  Past Surgical History:  Procedure Laterality Date  . CESAREAN SECTION N/A 04/13/2014   Procedure: CESAREAN SECTION;  Surgeon: Tilda Burrow, MD;  Location: [facility];  Service: Obstetrics;  Laterality: N/A;  . DILATION AND CURETTAGE OF UTERUS    . WISDOM TOOTH EXTRACTION      Past Obstetrical History:  OB History  Gravida Para Term Preterm AB Living  5 3 2 1 2 2   SAB TAB Ectopic Multiple Live Births  1 1   0 3    # Outcome Date GA Lbr Len/2nd Weight Sex Delivery Anes PTL Lv  5 Term 06/07/16 [redacted]w[redacted]d 08:00 / 00:24 7 lb 6.5 oz (3.359 kg) M VBAC EPI   LIV  4 TAB 09/27/14          3 Preterm 04/13/14 [redacted]w[redacted]d  1 lb 2.7 oz (0.53 kg) F CS-LVertical EPI, Local  ND  2 Term 09/18/13 [redacted]w[redacted]d 10:15 / 01:05 7 lb 7 oz (3.374 kg) M Vag-Spont EPI  LIV  1 SAB             Obstetric Comments  # abruption- preterm at 23.5 weeks and baby passed.  Has 2 living children now.     Past Gynecological History: As per HPI. Periods: qmonth, regular, 5 days, not particularly heavy or painful.  History of Pap Smear(s): Yes.   Last pap >3 years She is currently using no method for contraception.   Social History:  Social History   Socioeconomic History  . Marital status: Single    Spouse name: Not on file  . Number of children: Not on file  . Years of education: Not on file  . Highest education level: Not on file  Occupational History  . Occupation: CNA  Social Needs  . Financial resource strain: Not on file  . Food insecurity:    Worry: Not on file    Inability: Not on file  . Transportation needs:    Medical: Not on file    Non-medical: Not on file  Tobacco Use  . Smoking status: Former Smoker    Packs/day: 0.50    Years: 7.00    Pack years: 3.50    Types: Cigarettes  Last attempt to quit: 06/28/2014    Years since quitting: 4.2  . Smokeless tobacco: Never Used  Substance and Sexual Activity  . Alcohol use: Yes    Alcohol/week: 0.0 standard drinks    Comment: occasional  . Drug use: No    Types: Cocaine, Marijuana    Comment: y- last use as of 02/2015  . Sexual activity: Yes    Birth control/protection: None  Lifestyle  . Physical activity:    Days per week: Not on file    Minutes per session: Not on file  . Stress: Not on file  Relationships  . Social connections:    Talks on phone: Not on file    Gets together: Not on file    Attends religious service: Not on file    Active member of club or organization: Not on file    Attends meetings of clubs or organizations: Not on file    Relationship status: Not on file  . Intimate  partner violence:    Fear of current or ex partner: Not on file    Emotionally abused: Not on file    Physically abused: Not on file    Forced sexual activity: Not on file  Other Topics Concern  . Not on file  Social History Narrative  . Not on file    Family History:  Family History  Problem Relation Age of Onset  . Asthma Brother   . Diabetes Paternal Grandmother   . Kidney disease Paternal Grandmother   . Heart disease Paternal Grandmother   . Heart attack Paternal Grandmother   . Diabetes Maternal Aunt      Medications Michele A. Leckrone had no medications administered during this visit. Current Outpatient Medications  Medication Sig Dispense Refill  . albuterol (PROVENTIL HFA;VENTOLIN HFA) 108 (90 Base) MCG/ACT inhaler Inhale 2 puffs into the lungs every 4 (four) hours as needed for wheezing or shortness of breath (cough, shortness of breath or wheezing.). (Patient not taking: Reported on 01/22/2018) 1 Inhaler 1  . cetirizine (ZYRTEC) 10 MG tablet Take 1 tablet (10 mg total) by mouth daily. (Patient not taking: Reported on 09/11/2018) 30 tablet 1  . cyclobenzaprine (FLEXERIL) 10 MG tablet Take 0.5-1 tablets (5-10 mg total) by mouth 2 (two) times daily as needed for muscle spasms. (Patient not taking: Reported on 09/11/2018) 20 tablet 0  . fluticasone (FLONASE) 50 MCG/ACT nasal spray Place 1 spray into both nostrils daily. (Patient not taking: Reported on 01/22/2018) 16 g 2  . meloxicam (MOBIC) 15 MG tablet Take 1 tablet (15 mg total) by mouth daily. Take 1 daily with food. (Patient not taking: Reported on 09/11/2018) 10 tablet 0  . senna-docusate (SENOKOT-S) 8.6-50 MG tablet Take 2 tablets by mouth daily. (Patient not taking: Reported on 01/22/2018) 60 tablet 0   No current facility-administered medications for this visit.     Allergies Patient has no known allergies.   Physical Exam:  BP 128/82   Pulse 62   Ht  (1.702 m)   Wt 219 lb (99.3 kg)   LMP 08/22/2018  (Exact Date)   BMI 34.30 kg/m  Body mass index is 34.3 kg/m. General appearance: Well nourished, well developed female in no acute distress.  Neck:  Supple, normal appearance, and no thyromegaly  Cardiovascular: normal s1 and s2.  No murmurs, rubs or gallops. Respiratory:  Clear to auscultation bilateral. Normal respiratory effort Abdomen: positive bowel sounds and no masses, hernias; diffusely non tender to palpation, non distended Breasts:  breasts appear normal, no suspicious masses, no skin or nipple changes or axillary nodes, and normal palpation. Neuro/Psych:  Normal mood and affect.  Skin:  Warm and dry.  Lymphatic:  No inguinal lymphadenopathy.   Pelvic exam: is not limited by body habitus EGBUS: within normal limits, Vagina: within normal limits and with no blood but with large amounts of cottage cheese white discharge in the vault, Cervix: normal appearing cervix without tenderness, discharge or lesions. Uterus:  nonenlarged and non tender and Adnexa:  normal adnexa and no mass, fullness, tenderness Rectovaginal: deferred  Laboratory: none  Radiology: none  Assessment: pt stable  Plan: 1. Cervical cancer screening Pap updated today - Cytology - PAP( [place])  2. Well woman exam Routine care. Pt with h/o DM and with recurrent yeast would like a1c. Will do this in addition to other labs. Not interested in [facility] at this time.  - Cytology - PAP( [place]) - BV+Ct/Ng/Tv NAA+Yeast Culture - Hemoglobin A1c  3. Vulvovaginal candidiasis Will do flagyl course and diflucan x 4 and f/u yeast culture. D/w pt to try qhs kefir as a better probiotic and to make sure using breathable clothing with exercise.  - BV+Ct/Ng/Tv NAA+Yeast Culture - Hemoglobin A1c  RTC PRN  Cornelia Copa MD Attending Center for [facility])

## 2018-09-11 NOTE — Progress Notes (Signed)
Concerned about recurring yeast infections, has family history of diabetes and thought about getting an A1C drawn.   Last pap was in 2014

## 2018-09-11 NOTE — Patient Instructions (Signed)
Take a quarter cup of Kefir every night.

## 2018-09-12 LAB — COMPREHENSIVE METABOLIC PANEL
ALT: 13 IU/L (ref 0–32)
AST: 15 IU/L (ref 0–40)
Albumin/Globulin Ratio: 1.4 (ref 1.2–2.2)
Albumin: 3.8 g/dL — ABNORMAL LOW (ref 3.9–5.0)
Alkaline Phosphatase: 63 IU/L (ref 39–117)
BUN / CREAT RATIO: 23 (ref 9–23)
BUN: 20 mg/dL (ref 6–20)
Bilirubin Total: <0.2 mg/dL (ref 0.0–1.2)
CO2: 21 mmol/L (ref 20–29)
CREATININE: 0.86 mg/dL (ref 0.57–1.00)
Calcium: 9.1 mg/dL (ref 8.7–10.2)
Chloride: 105 mmol/L (ref 96–106)
GFR calc Af Amer: 106 mL/min/{1.73_m2} (ref >59)
GFR calc non Af Amer: 92 mL/min/{1.73_m2} (ref >59)
Globulin, Total: 2.8 g/dL (ref 1.5–4.5)
Glucose: 88 mg/dL (ref 65–99)
Potassium: 4.3 mmol/L (ref 3.5–5.2)
Sodium: 141 mmol/L (ref 134–144)
Total Protein: 6.6 g/dL (ref 6.0–8.5)

## 2018-09-12 LAB — LIPID PANEL
Chol/HDL Ratio: 2.2 ratio (ref 0.0–4.4)
Cholesterol, Total: 147 mg/dL (ref 100–199)
HDL: 67 mg/dL (ref >39)
LDL Calculated: 65 mg/dL (ref 0–99)
Triglycerides: 74 mg/dL (ref 0–149)
VLDL CHOLESTEROL CAL: 15 mg/dL (ref 5–40)

## 2018-09-12 LAB — HEMOGLOBIN A1C
Est. average glucose Bld gHb Est-mCnc: 103 mg/dL
Hgb A1c MFr Bld: 5.2 % (ref 4.8–5.6)

## 2018-09-12 LAB — CBC
Hematocrit: 37.1 % (ref 34.0–46.6)
Hemoglobin: 12.4 g/dL (ref 11.1–15.9)
MCH: 31.4 pg (ref 26.6–33.0)
MCHC: 33.4 g/dL (ref 31.5–35.7)
MCV: 94 fL (ref 79–97)
PLATELETS: 142 10*3/uL — AB (ref 150–450)
RBC: 3.95 x10E6/uL (ref 3.77–5.28)
RDW: 12.2 % (ref 11.7–15.4)
WBC: 8.6 10*3/uL (ref 3.4–10.8)

## 2018-09-12 LAB — HEPATITIS C ANTIBODY

## 2018-09-12 LAB — HIV ANTIBODY (ROUTINE TESTING W REFLEX): HIV Screen 4th Generation wRfx: NONREACTIVE

## 2018-09-12 LAB — HEPATITIS B SURFACE ANTIGEN: Hepatitis B Surface Ag: NEGATIVE

## 2018-09-12 LAB — TSH: TSH: 1.38 u[IU]/mL (ref 0.450–4.500)

## 2018-09-12 LAB — RPR: RPR Ser Ql: NONREACTIVE

## 2018-09-13 LAB — BV+CT/NG/TV NAA+YEAST CULTURE
Chlamydia by NAA: NEGATIVE
Gonococcus by NAA: NEGATIVE
Trich vag by NAA: NEGATIVE

## 2018-09-13 LAB — CYTOLOGY - PAP: DIAGNOSIS: NEGATIVE

## 2018-09-20 ENCOUNTER — Other Ambulatory Visit: Payer: Self-pay | Admitting: Obstetrics and Gynecology

## 2018-09-20 ENCOUNTER — Telehealth: Payer: Self-pay | Admitting: *Deleted

## 2018-09-20 DIAGNOSIS — D696 Thrombocytopenia, unspecified: Secondary | ICD-10-CM

## 2018-09-20 DIAGNOSIS — B373 Candidiasis of vulva and vagina: Secondary | ICD-10-CM

## 2018-09-20 DIAGNOSIS — B3731 Acute candidiasis of vulva and vagina: Secondary | ICD-10-CM

## 2018-09-20 HISTORY — DX: Thrombocytopenia, unspecified: D69.6

## 2018-09-20 NOTE — Telephone Encounter (Signed)
Pt called wanting to discuss her lab results, had general questions and wondered if she should be concerned about her platelets. Informed that nothing was concerning at this time.

## 2018-09-22 ENCOUNTER — Encounter: Payer: Self-pay | Admitting: *Deleted

## 2018-09-25 ENCOUNTER — Ambulatory Visit: Payer: BLUE CROSS/BLUE SHIELD

## 2018-09-26 ENCOUNTER — Ambulatory Visit (INDEPENDENT_AMBULATORY_CARE_PROVIDER_SITE_OTHER): Payer: BLUE CROSS/BLUE SHIELD

## 2018-09-26 ENCOUNTER — Other Ambulatory Visit: Payer: Self-pay

## 2018-09-26 VITALS — BP 115/87 | HR 72

## 2018-09-26 DIAGNOSIS — B373 Candidiasis of vulva and vagina: Secondary | ICD-10-CM

## 2018-09-26 DIAGNOSIS — B3731 Acute candidiasis of vulva and vagina: Secondary | ICD-10-CM

## 2018-09-26 DIAGNOSIS — N898 Other specified noninflammatory disorders of vagina: Secondary | ICD-10-CM

## 2018-09-26 DIAGNOSIS — D696 Thrombocytopenia, unspecified: Secondary | ICD-10-CM

## 2018-09-26 LAB — PLATELET COUNT: Platelets: 142 10*3/uL — ABNORMAL LOW (ref 150–450)

## 2018-09-26 NOTE — Progress Notes (Signed)
SUBJECTIVE:  28 y.o. female complains of {pe vag discharge for several weeks. Denies abnormal vaginal bleeding or significant pelvic pain or fever. No UTI symptoms. Denies history of known exposure to STD.  No LMP recorded.   OBJECTIVE:  She appears well, afebrile. Urine dipstick: not done   ASSESSMENT:  Vaginal Discharge small amount  Vaginal Odor small amount   PLAN:  GC, chlamydia, trichomonas, BVAG, CVAG probe sent to lab. Treatment: To be determined once lab results are received ROV prn if symptoms persist or worsen.

## 2018-10-04 LAB — NUSWAB VG, CANDIDA 6SP
C PARAPSILOSIS/TROPICALIS: NEGATIVE
Candida albicans, NAA: POSITIVE — AB
Candida glabrata, NAA: NEGATIVE
Candida krusei, NAA: NEGATIVE
Candida lusitaniae, NAA: NEGATIVE
Trich vag by NAA: NEGATIVE

## 2018-11-02 ENCOUNTER — Ambulatory Visit: Payer: BLUE CROSS/BLUE SHIELD | Admitting: Obstetrics and Gynecology

## 2018-11-08 ENCOUNTER — Ambulatory Visit: Payer: BLUE CROSS/BLUE SHIELD | Admitting: Obstetrics and Gynecology

## 2018-12-18 ENCOUNTER — Ambulatory Visit (HOSPITAL_COMMUNITY)
Admission: EM | Admit: 2018-12-18 | Discharge: 2018-12-18 | Disposition: A | Discharge status: Home / Self Care | Payer: BLUE CROSS/BLUE SHIELD | Attending: Family Medicine | Admitting: Family Medicine

## 2018-12-18 ENCOUNTER — Encounter (HOSPITAL_COMMUNITY): Payer: Self-pay | Admitting: Emergency Medicine

## 2018-12-18 ENCOUNTER — Other Ambulatory Visit: Payer: Self-pay

## 2018-12-18 DIAGNOSIS — H9201 Otalgia, right ear: Secondary | ICD-10-CM

## 2018-12-18 NOTE — Discharge Instructions (Signed)
Use the Flonase daily There is no foreign material in your ear canal "Popping" your ear may help Drink plenty fluids Return as needed

## 2018-12-18 NOTE — ED Triage Notes (Signed)
Pt here for right ear pain and fullness 

## 2018-12-18 NOTE — ED Provider Notes (Signed)
[facility]    CSN: [id number] Arrival date & time: 12/18/18  1415      History   Chief Complaint Chief Complaint  Patient presents with  . Otalgia    appt 2:50    HPI Michele Hayden is a [age] y.o. female.   HPI  Right ear pain and fullness for a couple of days.  Decreased hearing.  No drainage.  Has chronic allergy symptoms.  NO fefer, sore throat or other illness  Past Medical History:  Diagnosis Date  . Chlamydia infection complicating pregnancy in second trimester 04/10/2013  . GERD (gastroesophageal reflux disease)   . Pregnancy induced hypertension     Patient Active Problem List   Diagnosis Date Noted  . Thrombocytopenia (HCC) 09/20/2018  . Vulvovaginal candidiasis 09/11/2018  . Eczema 05/25/2016    Past Surgical History:  Procedure Laterality Date  . CESAREAN SECTION N/A 04/13/2014   Procedure: CESAREAN SECTION;  Surgeon: Tilda BurrowJohn Ferguson V, MD;  Location: [facility];  Service: Obstetrics;  Laterality: N/A;  . DILATION AND CURETTAGE OF UTERUS    . WISDOM TOOTH EXTRACTION      OB History    Gravida  5   Para  3   Term  2   Preterm  1   AB  2   Living  2     SAB  1   TAB  1   Ectopic      Multiple  0   Live Births  3        Obstetric Comments  # abruption- preterm at 23.5 weeks and baby passed.  Has 2 living children now.          Home Medications    Prior to Admission medications   Medication Sig Start Date End Date Taking? Authorizing Provider  fluticasone (FLONASE) 50 MCG/ACT nasal spray Place 1 spray into both nostrils daily. Patient not taking: Reported on 01/22/2018 03/15/17   Mathews RobinsonsMitchell, Jessica B, PA-C  albuterol (PROVENTIL HFA;VENTOLIN HFA) 108 (90 Base) MCG/ACT inhaler Inhale 2 puffs into the lungs every 4 (four) hours as needed for wheezing or shortness of breath (cough, shortness of breath or wheezing.). Patient not taking: Reported on 01/22/2018 03/15/17 12/18/18  Elvina SidleLauenstein, Kurt, MD  cetirizine (ZYRTEC) 10 MG  tablet Take 1 tablet (10 mg total) by mouth daily. Patient not taking: Reported on 09/11/2018 03/15/17 12/18/18  Georgiana ShoreMitchell, Jessica B, PA-C    Family History Family History  Problem Relation Age of Onset  . Asthma Brother   . Diabetes Paternal Grandmother   . Kidney disease Paternal Grandmother   . Heart disease Paternal Grandmother   . Heart attack Paternal Grandmother   . Diabetes Maternal Aunt     Social History Social History   Tobacco Use  . Smoking status: Former Smoker    Packs/day: 0.50    Years: 7.00    Pack years: 3.50    Types: Cigarettes    Quit date: 06/28/2014    Years since quitting: 4.4  . Smokeless tobacco: Never Used  Substance Use Topics  . Alcohol use: Yes    Alcohol/week: 0.0 standard drinks    Comment: occasional  . Drug use: No    Types: Cocaine, Marijuana    Comment: y- last use as of 02/2015     Allergies   Patient has no known allergies.   Review of Systems Review of Systems  Constitutional: Negative for chills and fever.  HENT: Positive for ear pain. Negative for congestion,  ear discharge and sore throat.   Eyes: Negative for pain and visual disturbance.  Respiratory: Negative for cough and shortness of breath.   Cardiovascular: Negative for chest pain and palpitations.  Gastrointestinal: Negative for abdominal pain and vomiting.  Genitourinary: Negative for dysuria and hematuria.  Musculoskeletal: Negative for arthralgias and back pain.  Skin: Negative for color change and rash.  Neurological: Negative for seizures and syncope.  All other systems reviewed and are negative.    Physical Exam Triage Vital Signs ED Triage Vitals [12/18/18 1433]  Enc Vitals Group     BP 126/88     Pulse Rate 61     Resp 18     Temp 98.5 F (36.9 C)     Temp Source Oral     SpO2 100 %     Weight      Height      Head Circumference      Peak Flow      Pain Score 6     Pain Loc      Pain Edu?      Excl. in GC?    No data found.  Updated  Vital Signs BP 126/88 (BP Location: Right Arm)   Pulse 61   Temp 98.5 F (36.9 C) (Oral)   Resp 18   SpO2 100%      Physical Exam Constitutional:      General: She is not in acute distress.    Appearance: She is well-developed.  HENT:     Head: Normocephalic and atraumatic.     Right Ear: There is impacted cerumen.     Ears:     Comments: Ear canals are very small.  Initially the right TM was occluded with wax.  After irrigation it is visible.  It is clear.  Slight fluid behind.    Nose: No congestion.     Mouth/Throat:     Mouth: Mucous membranes are moist.  Eyes:     Conjunctiva/sclera: Conjunctivae normal.     Pupils: Pupils are equal, round, and reactive to light.  Neck:     Musculoskeletal: Normal range of motion.  Cardiovascular:     Rate and Rhythm: Normal rate.  Pulmonary:     Effort: Pulmonary effort is normal. No respiratory distress.  Abdominal:     General: There is no distension.     Palpations: Abdomen is soft.  Musculoskeletal: Normal range of motion.  Skin:    General: Skin is warm and dry.  Neurological:     Mental Status: She is alert.      UC Treatments / Results  Labs (all labs ordered are listed, but only abnormal results are displayed) Labs Reviewed - No data to display  EKG None  Radiology No results found.  Procedures Procedures (including critical care time)  Medications Ordered in UC Medications - No data to display  Initial Impression / Assessment and Plan / UC Course  I have reviewed the triage vital signs and the nursing notes.  Pertinent labs & imaging results that were available during my care of the patient were reviewed by me and considered in my medical decision making (see chart for details).     I think the patient has more eustachian tube dysfunction than cerumen causing her symptoms.  Discussed home treatment. Final Clinical Impressions(s) / UC Diagnoses   Final diagnoses:  Right ear pain     Discharge  Instructions     Use the Flonase daily There is no foreign  material in your ear canal "Popping" your ear may help Drink plenty fluids Return as needed   ED Prescriptions    None     Controlled Substance Prescriptions [place] Controlled Substance Registry consulted? Not Applicable   Raylene Everts, MD 12/18/18 [phone]

## 2019-02-20 ENCOUNTER — Encounter: Payer: Self-pay | Admitting: Obstetrics and Gynecology

## 2019-02-20 ENCOUNTER — Ambulatory Visit (INDEPENDENT_AMBULATORY_CARE_PROVIDER_SITE_OTHER): Payer: BLUE CROSS/BLUE SHIELD | Admitting: Obstetrics and Gynecology

## 2019-02-20 ENCOUNTER — Other Ambulatory Visit: Payer: Self-pay

## 2019-02-20 VITALS — BP 126/78 | HR 73 | Wt 213.6 lb

## 2019-02-20 DIAGNOSIS — Z8751 Personal history of pre-term labor: Secondary | ICD-10-CM | POA: Insufficient documentation

## 2019-02-20 DIAGNOSIS — E669 Obesity, unspecified: Secondary | ICD-10-CM

## 2019-02-20 DIAGNOSIS — Z3A08 8 weeks gestation of pregnancy: Secondary | ICD-10-CM

## 2019-02-20 DIAGNOSIS — O99210 Obesity complicating pregnancy, unspecified trimester: Secondary | ICD-10-CM

## 2019-02-20 DIAGNOSIS — Z98891 History of uterine scar from previous surgery: Secondary | ICD-10-CM

## 2019-02-20 DIAGNOSIS — O99211 Obesity complicating pregnancy, first trimester: Secondary | ICD-10-CM

## 2019-02-20 DIAGNOSIS — O0990 Supervision of high risk pregnancy, unspecified, unspecified trimester: Secondary | ICD-10-CM

## 2019-02-20 DIAGNOSIS — D696 Thrombocytopenia, unspecified: Secondary | ICD-10-CM

## 2019-02-20 DIAGNOSIS — O09211 Supervision of pregnancy with history of pre-term labor, first trimester: Secondary | ICD-10-CM

## 2019-02-20 DIAGNOSIS — O0991 Supervision of high risk pregnancy, unspecified, first trimester: Secondary | ICD-10-CM

## 2019-02-20 DIAGNOSIS — Z8759 Personal history of other complications of pregnancy, childbirth and the puerperium: Secondary | ICD-10-CM | POA: Insufficient documentation

## 2019-02-20 DIAGNOSIS — Z3401 Encounter for supervision of normal first pregnancy, first trimester: Secondary | ICD-10-CM | POA: Insufficient documentation

## 2019-02-20 DIAGNOSIS — O34211 Maternal care for low transverse scar from previous cesarean delivery: Secondary | ICD-10-CM

## 2019-02-20 DIAGNOSIS — O09291 Supervision of pregnancy with other poor reproductive or obstetric history, first trimester: Secondary | ICD-10-CM

## 2019-02-20 HISTORY — DX: History of uterine scar from previous surgery: Z98.891

## 2019-02-20 NOTE — Progress Notes (Signed)
New OB Note  02/20/2019   [facility]  Chief Complaint: NOB  Transfer of Care Patient: no  History of Present Illness: [name] is a 28 y.o. [id number] @ 8/2 weeks (EDC 4/4, based on 8wk u/s)  Patient's last menstrual period was 12/13/2018 (approximate). Preg complicated by has Obesity in pregnancy; History of placenta abruption; Eczema; Thrombocytopenia (HCC); Encounter for supervision of normal first pregnancy in first trimester; History of VBAC; History of gestational hypertension; History of preterm delivery; and BMI 30s on their problem list.   Any events prior to today's visit: no She was using no method when she conceived.  She has Negative signs or symptoms of nausea/vomiting of pregnancy. She has Negative signs or symptoms of miscarriage or preterm labor On any medications around the time she conceived/early pregnancy: No   ROS: A 12-point review of systems was performed and negative, except as stated in the above HPI.  OBGYN History: As per HPI. OB History  Gravida Para Term Preterm AB Living  6 3 2 1 2 2   SAB TAB Ectopic Multiple Live Births  1 1   0 3    # Outcome Date GA Lbr Len/2nd Weight Sex Delivery Anes PTL Lv  6 Current           5 Term 06/07/16 5282w6d 08:00 / 00:24 7 lb 6.5 oz (3.359 kg) M VBAC EPI  LIV  4 TAB 09/27/14          3 Preterm 04/13/14 6555w5d  1 lb 2.7 oz (0.53 kg) F CS-LVertical EPI, Local  ND  2 Term 09/18/13 5576w5d 10:15 / 01:05 7 lb 7 oz (3.374 kg) M Vag-Spont EPI  LIV  1 SAB             Obstetric Comments  # abruption- preterm at 23.5 weeks and baby passed.  Has 2 living children now.     Any issues with any prior pregnancies: see above History of pap smears: Yes. Last pap smear 2020 and results were negative   Past Medical History: Past Medical History:  Diagnosis Date  . Chlamydia infection complicating pregnancy in second trimester 04/10/2013  . GERD (gastroesophageal reflux disease)   . Pregnancy  induced hypertension     Past Surgical History: Past Surgical History:  Procedure Laterality Date  . CESAREAN SECTION N/A 04/13/2014   Procedure: CESAREAN SECTION;  Surgeon: Tilda BurrowJohn Ferguson V, MD;  Location: [facility];  Service: Obstetrics;  Laterality: N/A;  . DILATION AND CURETTAGE OF UTERUS    . WISDOM TOOTH EXTRACTION      Family History:  Family History  Problem Relation Age of Onset  . Asthma Brother   . Diabetes Paternal Grandmother   . Kidney disease Paternal Grandmother   . Heart disease Paternal Grandmother   . Heart attack Paternal Grandmother   . Diabetes Maternal Aunt     Social History:  Social History   Socioeconomic History  . Marital status: Single    Spouse name: Not on file  . Number of children: Not on file  . Years of education: Not on file  . Highest education level: Not on file  Occupational History  . Occupation: CNA  Social Needs  . Financial resource strain: Not on file  . Food insecurity    Worry: Not on file    Inability: Not on file  . Transportation needs    Medical: Not on file    Non-medical: Not on file  Tobacco Use  .  Smoking status: Former Smoker    Packs/day: 0.50    Years: 7.00    Pack years: 3.50    Types: Cigarettes    Quit date: 06/28/2014    Years since quitting: 4.6  . Smokeless tobacco: Never Used  Substance and Sexual Activity  . Alcohol use: Not Currently    Alcohol/week: 0.0 standard drinks    Comment: occasional  . Drug use: No    Types: Cocaine, Marijuana    Comment: y- last use as of 02/2015  . Sexual activity: Yes    Birth control/protection: None  Lifestyle  . Physical activity    Days per week: Not on file    Minutes per session: Not on file  . Stress: Not on file  Relationships  . Social Musician on phone: Not on file    Gets together: Not on file    Attends religious service: Not on file    Active member of club or organization: Not on file    Attends meetings of clubs or organizations:  Not on file    Relationship status: Not on file  . Intimate partner violence    Fear of current or ex partner: Not on file    Emotionally abused: Not on file    Physically abused: Not on file    Forced sexual activity: Not on file  Other Topics Concern  . Not on file  Social History Narrative  . Not on file     Allergy: No Known Allergies   Current Outpatient Medications: PNV  Physical Exam:   BP 126/78   Pulse 73   Wt 213 lb 9.6 oz (96.9 kg)   LMP 12/13/2018 (Approximate)   BMI 33.45 kg/m  Body mass index is 33.45 kg/m.   Vag. Bleeding: None. Fundal height: not applicable FHTs: 170s  General appearance: Well nourished, well developed female in no acute distress.  Neck:  Supple, normal appearance, and no thyromegaly  Cardiovascular: S1, S2 normal, no murmur, rub or gallop, regular rate and rhythm Respiratory:  Clear to auscultation bilateral. Normal respiratory effort Abdomen: positive bowel sounds and no masses, hernias; diffusely non tender to palpation, non distended Breasts: pt denies any breast s/s. Neuro/Psych:  Normal mood and affect.  Skin:  Warm and dry.   Laboratory: none  Imaging:  Bedside u/s with SLIUP at 8/2 weeks, FHR 160s-170s  Assessment: pt doing well  Plan: 1. Supervision of high risk pregnancy, antepartum Routine care. Pt to come back for genetics and nob labs - Obstetric Panel, Including HIV - TSH - Genetic Screening - GC/Chlamydia probe amp ([place])not at [facility] - Urine Culture - Hemoglobin A1c - CMP and Liver - Protein / creatinine ratio, urine  2. Obesity in pregnancy - TSH - Hemoglobin A1c - CMP and Liver - Protein / creatinine ratio, urine  3. BMI 30s Goals d/w - TSH - Hemoglobin A1c - CMP and Liver - Protein / creatinine ratio, urine  4. Thrombocytopenia (HCC) F/u nob labs  5. History of gestational hypertension Start low dose asa after 12wks. Baseline labs today  6. History of VBAC D/w her more later  in pregnancy  7. History of placenta abruption Pt states she took the progerstone vaginally and no PO last pregnancy. Start at 16wks.   8. History of preterm delivery See above  Problem list reviewed and updated.  Follow up in 2 weeks.  The nature of [facility] with multiple MDs and  other Advanced Practice Providers was explained to patient; also emphasized that residents, students are part of our team.  >50% of 25 min visit spent on counseling and coordination of care.     Durene Romans MD Attending Center for [facility])

## 2019-02-20 NOTE — Progress Notes (Signed)
PAP SMEAR -09/11/2018 normal  Declined flu

## 2019-03-02 ENCOUNTER — Other Ambulatory Visit: Payer: BLUE CROSS/BLUE SHIELD

## 2019-03-02 ENCOUNTER — Other Ambulatory Visit: Payer: Self-pay

## 2019-03-02 DIAGNOSIS — Z3401 Encounter for supervision of normal first pregnancy, first trimester: Secondary | ICD-10-CM

## 2019-03-03 LAB — URINE CULTURE

## 2019-03-04 LAB — CMP AND LIVER
ALT: 8 IU/L (ref 0–32)
AST: 14 IU/L (ref 0–40)
Albumin: 4.1 g/dL (ref 3.9–5.0)
Alkaline Phosphatase: 63 IU/L (ref 39–117)
BUN: 10 mg/dL (ref 6–20)
Bilirubin Total: 0.3 mg/dL (ref 0.0–1.2)
Bilirubin, Direct: 0.12 mg/dL (ref 0.00–0.40)
CO2: 21 mmol/L (ref 20–29)
Calcium: 9.1 mg/dL (ref 8.7–10.2)
Chloride: 103 mmol/L (ref 96–106)
Creatinine, Ser: 0.74 mg/dL (ref 0.57–1.00)
GFR calc Af Amer: 127 mL/min/{1.73_m2} (ref >59)
GFR calc non Af Amer: 110 mL/min/{1.73_m2} (ref >59)
Glucose: 98 mg/dL (ref 65–99)
Potassium: 4 mmol/L (ref 3.5–5.2)
Sodium: 135 mmol/L (ref 134–144)
Total Protein: 6.5 g/dL (ref 6.0–8.5)

## 2019-03-04 LAB — OBSTETRIC PANEL, INCLUDING HIV
Antibody Screen: NEGATIVE
Basophils Absolute: 0 10*3/uL (ref 0.0–0.2)
Basos: 0 %
EOS (ABSOLUTE): 0.1 10*3/uL (ref 0.0–0.4)
Eos: 1 %
HIV Screen 4th Generation wRfx: NONREACTIVE
Hematocrit: 37.4 % (ref 34.0–46.6)
Hemoglobin: 12.4 g/dL (ref 11.1–15.9)
Hepatitis B Surface Ag: NEGATIVE
Immature Grans (Abs): 0 10*3/uL (ref 0.0–0.1)
Immature Granulocytes: 0 %
Lymphocytes Absolute: 1.4 10*3/uL (ref 0.7–3.1)
Lymphs: 23 %
MCH: 31.2 pg (ref 26.6–33.0)
MCHC: 33.2 g/dL (ref 31.5–35.7)
MCV: 94 fL (ref 79–97)
Monocytes Absolute: 0.5 10*3/uL (ref 0.1–0.9)
Monocytes: 8 %
Neutrophils Absolute: 4.3 10*3/uL (ref 1.4–7.0)
Neutrophils: 68 %
Platelets: 100 10*3/uL — CL (ref 150–450)
RBC: 3.97 x10E6/uL (ref 3.77–5.28)
RDW: 12 % (ref 11.7–15.4)
RPR Ser Ql: NONREACTIVE
Rh Factor: POSITIVE
Rubella Antibodies, IGG: 16.1 index (ref >0.99)
WBC: 6.3 10*3/uL (ref 3.4–10.8)

## 2019-03-04 LAB — TSH: TSH: 0.754 u[IU]/mL (ref 0.450–4.500)

## 2019-03-04 LAB — HEMOGLOBIN A1C
Est. average glucose Bld gHb Est-mCnc: 100 mg/dL
Hgb A1c MFr Bld: 5.1 % (ref 4.8–5.6)

## 2019-03-04 LAB — PROTEIN / CREATININE RATIO, URINE
Creatinine, Urine: 106.5 mg/dL
Protein, Ur: 13.9 mg/dL
Protein/Creat Ratio: 131 mg/g creat (ref 0–200)

## 2019-03-07 ENCOUNTER — Other Ambulatory Visit: Payer: Self-pay | Admitting: Obstetrics and Gynecology

## 2019-03-07 ENCOUNTER — Telehealth: Payer: Self-pay | Admitting: *Deleted

## 2019-03-07 DIAGNOSIS — O0993 Supervision of high risk pregnancy, unspecified, third trimester: Secondary | ICD-10-CM

## 2019-03-07 DIAGNOSIS — D696 Thrombocytopenia, unspecified: Secondary | ICD-10-CM

## 2019-03-07 NOTE — Telephone Encounter (Signed)
Called pt to inform her of her results and being referred to hematology. Pt told to call us if she does not hear from them by middle of next week.

## 2019-03-12 ENCOUNTER — Encounter: Payer: Self-pay | Admitting: Radiology

## 2019-03-12 ENCOUNTER — Telehealth: Payer: Self-pay | Admitting: Radiology

## 2019-03-12 NOTE — Telephone Encounter (Signed)
Patient informed of [facility] results  

## 2019-03-14 ENCOUNTER — Encounter: Payer: BLUE CROSS/BLUE SHIELD | Admitting: Advanced Practice Midwife

## 2019-03-19 ENCOUNTER — Telehealth: Payer: Self-pay | Admitting: *Deleted

## 2019-03-19 NOTE — Telephone Encounter (Signed)
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 Pt verbalizes and understands

## 2019-03-29 ENCOUNTER — Encounter: Payer: BLUE CROSS/BLUE SHIELD | Admitting: Obstetrics and Gynecology

## 2019-03-30 ENCOUNTER — Telehealth: Payer: Self-pay | Admitting: Hematology

## 2019-03-30 NOTE — Telephone Encounter (Signed)
Pt cld back to reschedule appt to 10/27 at 1pm w/Dr. Irene Limbo

## 2019-03-30 NOTE — Telephone Encounter (Signed)
Received a new patient referral from Dr. Ilda Basset for thrombocytopenia in pregnancy. [name] returned my call and has been scheduled to see Dr. Irene Limbo on 10/28 at 10am. She's been made aware to arrive 15 minutes early.

## 2019-04-04 ENCOUNTER — Ambulatory Visit (INDEPENDENT_AMBULATORY_CARE_PROVIDER_SITE_OTHER): Payer: BLUE CROSS/BLUE SHIELD | Admitting: Family Medicine

## 2019-04-04 ENCOUNTER — Other Ambulatory Visit: Payer: Self-pay

## 2019-04-04 VITALS — BP 134/90 | HR 70 | Wt 217.5 lb

## 2019-04-04 DIAGNOSIS — Z8751 Personal history of pre-term labor: Secondary | ICD-10-CM

## 2019-04-04 DIAGNOSIS — O0992 Supervision of high risk pregnancy, unspecified, second trimester: Secondary | ICD-10-CM

## 2019-04-04 DIAGNOSIS — O99210 Obesity complicating pregnancy, unspecified trimester: Secondary | ICD-10-CM

## 2019-04-04 DIAGNOSIS — O0990 Supervision of high risk pregnancy, unspecified, unspecified trimester: Secondary | ICD-10-CM | POA: Insufficient documentation

## 2019-04-04 DIAGNOSIS — Z8759 Personal history of other complications of pregnancy, childbirth and the puerperium: Secondary | ICD-10-CM

## 2019-04-04 DIAGNOSIS — Z98891 History of uterine scar from previous surgery: Secondary | ICD-10-CM

## 2019-04-04 DIAGNOSIS — O09212 Supervision of pregnancy with history of pre-term labor, second trimester: Secondary | ICD-10-CM

## 2019-04-04 DIAGNOSIS — D696 Thrombocytopenia, unspecified: Secondary | ICD-10-CM

## 2019-04-04 DIAGNOSIS — O09292 Supervision of pregnancy with other poor reproductive or obstetric history, second trimester: Secondary | ICD-10-CM

## 2019-04-04 DIAGNOSIS — O99212 Obesity complicating pregnancy, second trimester: Secondary | ICD-10-CM

## 2019-04-04 DIAGNOSIS — Z3A14 14 weeks gestation of pregnancy: Secondary | ICD-10-CM

## 2019-04-04 DIAGNOSIS — O34219 Maternal care for unspecified type scar from previous cesarean delivery: Secondary | ICD-10-CM

## 2019-04-04 MED ORDER — ASPIRIN EC 81 MG PO TBEC: 81.0000 mg | DELAYED_RELEASE_TABLET | Freq: Every day | ORAL | 2 refills | Status: DC

## 2019-04-04 MED ORDER — PROGESTERONE MICRONIZED 200 MG PO CAPS: ORAL_CAPSULE | ORAL | 3 refills | Status: DC

## 2019-04-04 NOTE — Progress Notes (Signed)
   PRENATAL VISIT NOTE  Subjective:  Michele Hayden is a 28 y.o. [id number] at [redacted]w[redacted]d being seen today for ongoing prenatal care.  She is currently monitored for the following issues for this high-risk pregnancy and has Obesity in pregnancy; History of placenta abruption; Eczema; Thrombocytopenia ([place]); History of VBAC; History of gestational hypertension; History of preterm delivery; BMI 30s; and Supervision of high risk pregnancy, antepartum on their problem list.  Patient reports no complaints.  Contractions: Not present. Vag. Bleeding: None.  Movement: Present. Denies leaking of fluid.   The following portions of the patient's history were reviewed and updated as appropriate: allergies, current medications, past family history, past medical history, past social history, past surgical history and problem list.   Objective:   Vitals:   04/04/19 1031  BP: 134/90  Pulse: 70  Weight: 217 lb 8 oz (98.7 kg)    Fetal Status: Fetal Heart Rate (bpm): 154   Movement: Present     General:  Alert, oriented and cooperative. Patient is in no acute distress.  Skin: Skin is warm and dry. No rash noted.   Cardiovascular: Normal heart rate noted  Respiratory: Normal respiratory effort, no problems with respiration noted  Abdomen: Soft, gravid, appropriate for gestational age.  Pain/Pressure: Absent     Pelvic: Cervical exam deferred        Extremities: Normal range of motion.  Edema: None  Mental Status: Normal mood and affect. Normal behavior. Normal judgment and thought content.   Assessment and Plan:  Pregnancy: [id number] at [redacted]w[redacted]d  1. Supervision of high risk pregnancy, antepartum Up to date currently - [place] MFM OB COMP + 14 WK; Future  2. History of gestational hypertension Discussed possibly starting ASA for prevention, recommended she start after heme visit unless I message her prior.   3. History of placenta abruption  4. History of preterm delivery Sent in vaginal progesterone to  start at 16 wks  5. History of VBAC Desires VBAC  6. Obesity in pregnancy TWG=14 lb 8 oz (6.577 kg)   7. Thrombocytopenia ([place]) Has appt with heme on 10/26 for evaluation given PLT 100K Appreciate opinion on ASA for preeclampsia ppx  Preterm labor symptoms and general obstetric precautions including but not limited to vaginal bleeding, contractions, leaking of fluid and fetal movement were reviewed in detail with the patient. Please refer to After Visit Summary for other counseling recommendations.   No follow-ups on file.  Future Appointments  Date Time Provider [place]  04/24/2019  1:00 PM Brunetta Genera, MD [facility] None  05/02/2019  9:30 AM Caren Macadam, MD [facility]  05/07/2019  9:00 AM [facility] [place] 3 [facility]    Caren Macadam, MD

## 2019-04-23 NOTE — Progress Notes (Signed)
HEMATOLOGY/ONCOLOGY CONSULTATION NOTE  Date of Service: 04/24/2019  Patient Care Team: Fleet Contras, MD as PCP - General (Internal Medicine)  CHIEF COMPLAINTS/PURPOSE OF CONSULTATION:  Thrombocytopenia in pregnancy  HISTORY OF PRESENTING ILLNESS:   Michele Hayden is a wonderful 28 y.o. female who has been referred to [place] by Dr Vergie Living for evaluation and management of thrombocytopenia in pregnancy. The pt reports that she is doing well overall.   The pt is 17 weeks and 2 days pregnant today. This is her fourth pregnancy. Her children are 5 and 2 and she had a miscarriage after a placental abruption at about week 15 with her second pregnancy. She had an emergency C-section after the placental abruption but has since had a normal vaginal delivery. There are no current plans for a C-section with her current pregnancy. The pt reports that her PLTs were around 98K 3 years ago after the birth of her last child. She has never been diagnosed with immune thrombocytopenia. Pt is currently using Claritin daily, pre-natal vitamins as prescribed and Tylenol as needed. Pt also uses saline sprays for her allergies. She only took Asprin once on accident. She is not currently taking acid suppressants. Pt is eating well and is trying to eat a balanced diet. She denies any bleeding concerns. Pt is not doing any recreational drugs at this time. 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   Most recent lab results (03/02/2019) of Obstetric Panel, including HIV is as follows: Hepatitis B Surface Ag is "Negative", RPR Ser Ql is "Non Reactive", Rubella Antibodies, IGG at 16.10, ABO Grouping is "O", Rh Factor is "Positive", Antibody Screen is "Negative", HIV Screen 4th Generation wRfx is "Non Reactive", WBC at 6.3K, RBC at 3.97, Hgb at 12.4, HCT at 37.4, MCV at 94, MCH at 31.2, MCHC at 33.2, RDW at 12.0, PLTs at 100K, Neutro Rel at 68, Lymphs Rel at 23, Monos Rel at 8, Eos Rel at 1, Basos Rel at 0, Neutro Abs  at 4.3K, Lymphs Abs at 1.4K, Monos Abs at 0.5K, Eos Abs at 0.1K, Basos Abs at 0.0K, Immature Granulocytes Rel at 0, Abs Immature Granulocytes at 0.0K, Glucose at 98, BUN at 10, Creatinine at 0.74, GFR Est AFR Am at 127, Sodium at 135, Potassium at 4.0, Chloride at 103, CO2 at 21, Calcium at 9.1, Total Protein at 6.5, Albumin at 4.1, TBIL at 0.3, Alkaline Phophatase at 63, AST at 14, ALT at 8.   On review of systems, pt denies mouth sores, abdominal pain, skin rashes  and any other symptoms.   On PMHx the pt reports C-section  On SHx the pt reports discontinued recreational drug usage   MEDICAL HISTORY:  Past Medical History:  Diagnosis Date  . Chlamydia infection complicating pregnancy in second trimester 04/10/2013  . GERD (gastroesophageal reflux disease)   . Pregnancy induced hypertension     SURGICAL HISTORY: Past Surgical History:  Procedure Laterality Date  . CESAREAN SECTION N/A 04/13/2014   Procedure: CESAREAN SECTION;  Surgeon: Tilda Burrow, MD;  Location: [facility];  Service: Obstetrics;  Laterality: N/A;  . DILATION AND CURETTAGE OF UTERUS    . WISDOM TOOTH EXTRACTION      SOCIAL HISTORY: Social History   Socioeconomic History  . Marital status: Single    Spouse name: Not on file  . Number of children: Not on file  . Years of education: Not on file  . Highest education level: Not on file  Occupational History  .  Occupation: CNA  Social Needs  . Financial resource strain: Not on file  . Food insecurity    Worry: Not on file    Inability: Not on file  . Transportation needs    Medical: Not on file    Non-medical: Not on file  Tobacco Use  . Smoking status: Former Smoker    Packs/day: 0.50    Years: 7.00    Pack years: 3.50    Types: Cigarettes    Quit date: 06/28/2014    Years since quitting: 4.8  . Smokeless tobacco: Never Used  Substance and Sexual Activity  . Alcohol use: Not Currently    Alcohol/week: 0.0 standard drinks    Comment: occasional  .  Drug use: No    Types: Cocaine, Marijuana    Comment: y- last use as of 02/2015  . Sexual activity: Yes    Birth control/protection: None  Lifestyle  . Physical activity    Days per week: Not on file    Minutes per session: Not on file  . Stress: Not on file  Relationships  . Social Musician on phone: Not on file    Gets together: Not on file    Attends religious service: Not on file    Active member of club or organization: Not on file    Attends meetings of clubs or organizations: Not on file    Relationship status: Not on file  . Intimate partner violence    Fear of current or ex partner: Not on file    Emotionally abused: Not on file    Physically abused: Not on file    Forced sexual activity: Not on file  Other Topics Concern  . Not on file  Social History Narrative  . Not on file    FAMILY HISTORY: Family History  Problem Relation Age of Onset  . Asthma Brother   . Diabetes Paternal Grandmother   . Kidney disease Paternal Grandmother   . Heart disease Paternal Grandmother   . Heart attack Paternal Grandmother   . Diabetes Maternal Aunt     ALLERGIES:  has No Known Allergies.  MEDICATIONS:  Current Outpatient Medications  Medication Sig Dispense Refill  . aspirin EC 81 MG tablet Take 1 tablet (81 mg total) by mouth daily. Take after 12 weeks for prevention of preeclampsia later in pregnancy 300 tablet 2  . fluticasone (FLONASE) 50 MCG/ACT nasal spray Place 1 spray into both nostrils daily. (Patient not taking: Reported on 01/22/2018) 16 g 2  . progesterone (PROMETRIUM) 200 MG capsule Place one capsule vaginally at bedtime 30 capsule 3   No current facility-administered medications for this visit.     REVIEW OF SYSTEMS:    10 Point review of Systems was done is negative except as noted above.  PHYSICAL EXAMINATION: ECOG PERFORMANCE STATUS: 0 - Asymptomatic  . Vitals:   04/24/19 1333  BP: 121/64  Pulse: 88  Resp: 18  Temp: 98.7 F (37.1  C)  SpO2: 100%   Filed Weights   04/24/19 1333  Weight: 222 lb 1.6 oz (100.7 kg)   .Body mass index is 34.79 kg/m.  Exam was given in a chair   GENERAL:alert, in no acute distress and comfortable SKIN: no acute rashes, no significant lesions EYES: conjunctiva are pink and non-injected, sclera anicteric OROPHARYNX: MMM, no exudates, no oropharyngeal erythema or ulceration NECK: supple, no JVD LYMPH:  no palpable lymphadenopathy in the cervical, axillary or inguinal regions LUNGS: clear to auscultation  b/l with normal respiratory effort HEART: regular rate & rhythm ABDOMEN:  normoactive bowel sounds , non tender, not distended. Extremity: no pedal edema PSYCH: alert & oriented x 3 with fluent speech NEURO: no focal motor/sensory deficits  LABORATORY DATA:  I have reviewed the data as listed  . CBC Latest Ref Rng & Units 04/24/2019 04/24/2019 03/02/2019  WBC 4.0 - 10.5 K/uL 7.7 - 6.3  Hemoglobin 12.0 - 15.0 g/dL 11.8(L) - 12.4  Hematocrit 34.0 - 46.6 % 36.2 34.6(L) 37.4  Platelets 150 - 400 K/uL 114(L) - 100(LL)    . CMP Latest Ref Rng & Units 04/24/2019 03/02/2019 09/11/2018  Glucose 70 - 99 mg/dL 95 98 88  BUN 6 - 20 mg/dL 10 10 20   Creatinine 0.44 - 1.00 mg/dL 9.600.70 4.540.74 0.980.86  Sodium 135 - 145 mmol/L 139 135 141  Potassium 3.5 - 5.1 mmol/L 4.0 4.0 4.3  Chloride 98 - 111 mmol/L 107 103 105  CO2 22 - 32 mmol/L 24 21 21   Calcium 8.9 - 10.3 mg/dL 1.1(B8.6(L) 9.1 9.1  Total Protein 6.5 - 8.1 g/dL 6.4(L) 6.5 6.6  Total Bilirubin 0.3 - 1.2 mg/dL <1.4(N<0.2(L) 0.3 <8.2<0.2  Alkaline Phos 38 - 126 U/L 63 63 63  AST 15 - 41 U/L 11(L) 14 15  ALT 0 - 44 U/L 8 8 13      RADIOGRAPHIC STUDIES: I have personally reviewed the radiological images as listed and agreed with the findings in the report. No results found.  ASSESSMENT & PLAN:   28 yo with    1) Thrombocytopenia in 2nd trimester of pregnancy Cannot r/o ITP PLAN: -Discussed patient's most recent labs from 03/02/2019, Hepatitis B  Surface Ag is "Negative", RPR Ser Ql is "Non Reactive", Rubella Antibodies, IGG at 16.10, ABO Grouping is "O", Rh Factor is "Positive", Antibody Screen is "Negative", HIV Screen 4th Generation wRfx is "Non Reactive", WBC at 6.3K, RBC at 3.97, Hgb at 12.4, HCT at 37.4, MCV at 94, MCH at 31.2, MCHC at 33.2, RDW at 12.0, PLTs at 100K, Neutro Rel at 68, Lymphs Rel at 23, Monos Rel at 8, Eos Rel at 1, Basos Rel at 0, Neutro Abs at 4.3K, Lymphs Abs at 1.4K, Monos Abs at 0.5K, Eos Abs at 0.1K, Basos Abs at 0.0K, Immature Granulocytes Rel at 0, Abs Immature Granulocytes at 0.0K, Glucose at 98, BUN at 10, Creatinine at 0.74, GFR Est AFR Am at 127, Sodium at 135, Potassium at 4.0, Chloride at 103, CO2 at 21, Calcium at 9.1, Total Protein at 6.5, Albumin at 4.1, TBIL at 0.3, Alkaline Phophatase at 63, AST at 14, ALT at 8.  -Advised pt that thrombocytopenia could be due to pseudothrombocytopenia, medications, pregnancy or vitamin deficiencies   -Advised pt that bleeding concerns don't typically occur unless PLTs <50K and spontaneous bleeding when PLTs <20K -Recommended pt avoid OTC medications whenever possible  -Recommended pt to eat balanced diet and continue pre-natal vitamins  - will continue to optimize nutrition -Will get labs today -Will see back in 8 weeks with labs    FOLLOW UP: Labs today RTC with Dr Candise CheKale with labs in 2 months  All of the patients questions were answered with apparent satisfaction. The patient knows to call the clinic with any problems, questions or concerns.  I spent 30mins counseling the patient face to face. The total time spent in the appointment was 40 minutes and more than 50% was on counseling and direct patient cares.    Wyvonnia LoraGautam Jaquesha Boroff MD MS AAHIVMS  [facility] Hematology/Oncology Physician [place]  (Office):       [phone] (Work cell):  [phone] (Fax):           [phone]  04/24/2019 2:13 PM  I, Yevette Edwards, am acting as a scribe for Dr.  Sullivan Lone.   .I have reviewed the above documentation for accuracy and completeness, and I agree with the above. Brunetta Genera MD

## 2019-04-24 ENCOUNTER — Telehealth: Payer: Self-pay | Admitting: Hematology

## 2019-04-24 ENCOUNTER — Inpatient Hospital Stay: Payer: BLUE CROSS/BLUE SHIELD

## 2019-04-24 ENCOUNTER — Other Ambulatory Visit: Payer: Self-pay

## 2019-04-24 ENCOUNTER — Inpatient Hospital Stay: Payer: BLUE CROSS/BLUE SHIELD | Attending: Hematology | Admitting: Hematology

## 2019-04-24 VITALS — BP 121/64 | HR 88 | Temp 98.7°F | Resp 18 | Ht 67.0 in | Wt 222.1 lb

## 2019-04-24 DIAGNOSIS — D693 Immune thrombocytopenic purpura: Secondary | ICD-10-CM

## 2019-04-24 DIAGNOSIS — O99112 Other diseases of the blood and blood-forming organs and certain disorders involving the immune mechanism complicating pregnancy, second trimester: Secondary | ICD-10-CM | POA: Diagnosis not present

## 2019-04-24 DIAGNOSIS — D696 Thrombocytopenia, unspecified: Secondary | ICD-10-CM | POA: Insufficient documentation

## 2019-04-24 LAB — CMP (CANCER CENTER ONLY)
ALT: 8 U/L (ref 0–44)
AST: 11 U/L — ABNORMAL LOW (ref 15–41)
Albumin: 3 g/dL — ABNORMAL LOW (ref 3.5–5.0)
Alkaline Phosphatase: 63 U/L (ref 38–126)
Anion gap: 8 (ref 5–15)
BUN: 10 mg/dL (ref 6–20)
CO2: 24 mmol/L (ref 22–32)
Calcium: 8.6 mg/dL — ABNORMAL LOW (ref 8.9–10.3)
Chloride: 107 mmol/L (ref 98–111)
Creatinine: 0.7 mg/dL (ref 0.44–1.00)
GFR, Est AFR Am: >60 mL/min (ref >60)
GFR, Estimated: >60 mL/min (ref >60)
Glucose, Bld: 95 mg/dL (ref 70–99)
Potassium: 4 mmol/L (ref 3.5–5.1)
Sodium: 139 mmol/L (ref 135–145)
Total Bilirubin: <0.2 mg/dL — ABNORMAL LOW (ref 0.3–1.2)
Total Protein: 6.4 g/dL — ABNORMAL LOW (ref 6.5–8.1)

## 2019-04-24 LAB — CBC WITH DIFFERENTIAL/PLATELET
Abs Immature Granulocytes: 0.01 10*3/uL (ref 0.00–0.07)
Basophils Absolute: 0 10*3/uL (ref 0.0–0.1)
Basophils Relative: 0 %
Eosinophils Absolute: 0.1 10*3/uL (ref 0.0–0.5)
Eosinophils Relative: 2 %
HCT: 34.6 % — ABNORMAL LOW (ref 36.0–46.0)
Hemoglobin: 11.8 g/dL — ABNORMAL LOW (ref 12.0–15.0)
Immature Granulocytes: 0 %
Lymphocytes Relative: 20 %
Lymphs Abs: 1.6 10*3/uL (ref 0.7–4.0)
MCH: 31.4 pg (ref 26.0–34.0)
MCHC: 34.1 g/dL (ref 30.0–36.0)
MCV: 92 fL (ref 80.0–100.0)
Monocytes Absolute: 0.5 10*3/uL (ref 0.1–1.0)
Monocytes Relative: 7 %
Neutro Abs: 5.5 10*3/uL (ref 1.7–7.7)
Neutrophils Relative %: 71 %
Platelets: 114 10*3/uL — ABNORMAL LOW (ref 150–400)
RBC: 3.76 MIL/uL — ABNORMAL LOW (ref 3.87–5.11)
RDW: 12.4 % (ref 11.5–15.5)
WBC: 7.7 10*3/uL (ref 4.0–10.5)
nRBC: 0 % (ref 0.0–0.2)

## 2019-04-24 LAB — PLATELET BY CITRATE

## 2019-04-24 LAB — VITAMIN B12: Vitamin B-12: 204 pg/mL (ref 180–914)

## 2019-04-24 LAB — IMMATURE PLATELET FRACTION: Immature Platelet Fraction: 17 % — ABNORMAL HIGH (ref 1.2–8.6)

## 2019-04-24 NOTE — Telephone Encounter (Signed)
Scheduled per los. Patient declined printout  

## 2019-04-25 ENCOUNTER — Encounter: Payer: BLUE CROSS/BLUE SHIELD | Admitting: Hematology

## 2019-04-27 LAB — FOLATE RBC
Folate, Hemolysate: 495 ng/mL
Folate, RBC: 1367 ng/mL (ref >498)
Hematocrit: 36.2 % (ref 34.0–46.6)

## 2019-05-02 ENCOUNTER — Telehealth (INDEPENDENT_AMBULATORY_CARE_PROVIDER_SITE_OTHER): Payer: BLUE CROSS/BLUE SHIELD | Admitting: Family Medicine

## 2019-05-02 ENCOUNTER — Telehealth: Payer: Self-pay | Admitting: *Deleted

## 2019-05-02 ENCOUNTER — Other Ambulatory Visit: Payer: Self-pay

## 2019-05-02 DIAGNOSIS — O99212 Obesity complicating pregnancy, second trimester: Secondary | ICD-10-CM

## 2019-05-02 DIAGNOSIS — O0992 Supervision of high risk pregnancy, unspecified, second trimester: Secondary | ICD-10-CM

## 2019-05-02 DIAGNOSIS — Z3A18 18 weeks gestation of pregnancy: Secondary | ICD-10-CM

## 2019-05-02 DIAGNOSIS — D696 Thrombocytopenia, unspecified: Secondary | ICD-10-CM

## 2019-05-02 DIAGNOSIS — O0990 Supervision of high risk pregnancy, unspecified, unspecified trimester: Secondary | ICD-10-CM

## 2019-05-02 DIAGNOSIS — O09212 Supervision of pregnancy with history of pre-term labor, second trimester: Secondary | ICD-10-CM

## 2019-05-02 DIAGNOSIS — O09292 Supervision of pregnancy with other poor reproductive or obstetric history, second trimester: Secondary | ICD-10-CM

## 2019-05-02 DIAGNOSIS — Z8751 Personal history of pre-term labor: Secondary | ICD-10-CM

## 2019-05-02 DIAGNOSIS — O99210 Obesity complicating pregnancy, unspecified trimester: Secondary | ICD-10-CM

## 2019-05-02 DIAGNOSIS — O34219 Maternal care for unspecified type scar from previous cesarean delivery: Secondary | ICD-10-CM

## 2019-05-02 DIAGNOSIS — O99112 Other diseases of the blood and blood-forming organs and certain disorders involving the immune mechanism complicating pregnancy, second trimester: Secondary | ICD-10-CM

## 2019-05-02 DIAGNOSIS — Z8759 Personal history of other complications of pregnancy, childbirth and the puerperium: Secondary | ICD-10-CM

## 2019-05-02 DIAGNOSIS — Z98891 History of uterine scar from previous surgery: Secondary | ICD-10-CM

## 2019-05-02 MED ORDER — CYANOCOBALAMIN 2000 MCG PO TABS: 1000.0000 ug | ORAL_TABLET | Freq: Every day | ORAL | 11 refills | Status: AC

## 2019-05-02 MED ORDER — PROGESTERONE MICRONIZED 200 MG PO CAPS: ORAL_CAPSULE | ORAL | 4 refills | Status: DC

## 2019-05-02 NOTE — Progress Notes (Signed)
I connected with@ on 05/02/19 at  9:30 AM EST by: Mychart video and phone and verified that I am speaking with the correct person using two identifiers.  Patient is located at home and provider is located at [facility]     The purpose of this virtual visit is to provide medical care while limiting exposure to the novel coronavirus. I discussed the limitations, risks, security and privacy concerns of performing an evaluation and management service by Mychart video and then telephone and the availability of in person appointments. I also discussed with the patient that there may be a patient responsible charge related to this service. By engaging in this virtual visit, you consent to the provision of healthcare.  Additionally, you authorize for your insurance to be billed for the services provided during this visit.  The patient expressed understanding and agreed to proceed.  The following staff members participated in the virtual visit:  Crosby Oyster    PRENATAL VISIT NOTE  Subjective:  Michele Hayden is a 28 y.o. [id number] at [redacted]w[redacted]d  for phone visit for ongoing prenatal care.  She is currently monitored for the following issues for this low-risk pregnancy and has Obesity in pregnancy; History of placenta abruption; Eczema; Thrombocytopenia ([place]); History of VBAC; History of gestational hypertension; History of preterm delivery; BMI 30s; and Supervision of high risk pregnancy, antepartum on their problem list.  Patient reports no complaints.  Contractions: Not present. Vag. Bleeding: None.  Movement: Present. Denies leaking of fluid.   The following portions of the patient's history were reviewed and updated as appropriate: allergies, current medications, past family history, past medical history, past social history, past surgical history and problem list.   Objective:  There were no vitals filed for this visit. Self-Obtained  Fetal Status:     Movement: Present     Assessment and Plan:   Pregnancy: [id number] at [redacted]w[redacted]d 1. Supervision of high risk pregnancy, antepartum Needs BP cuff  Up to date, reviewed OB box - Enroll Patient in Babyscripts - Babyscripts Schedule Optimization  2. History of placenta abruption No VB  3. History of preterm delivery Patient has not started progesterone, will start today. [facility]   4. History of VBAC Desires VBAC   5. Obesity in pregnancy TWG= 14 lb 8 oz (6.577 kg)  6. H/o of GHTN - Recommended she start ASA now with platelets being stable. Patient agrees  Preterm labor symptoms and general obstetric precautions including but not limited to vaginal bleeding, contractions, leaking of fluid and fetal movement were reviewed in detail with the patient.  Return in about 4 weeks (around 05/30/2019) for Routine prenatal care, in person.  Future Appointments  Date Time Provider [place]  05/07/2019  9:00 AM [facility] [place] 3 [facility]-US  07/03/2019  9:00 AM Brunetta Genera, MD [facility] None     Time spent on virtual visit: 15 minutes  Caren Macadam, MD

## 2019-05-02 NOTE — Telephone Encounter (Signed)
Notified patient of results and Dr.Kale's directions. She verbalized understanding.

## 2019-05-02 NOTE — Progress Notes (Signed)
I connected with  Michele Hayden on 05/02/19 at  9:30 AM EST by telephone and verified that I am speaking with the correct person using two identifiers.   I discussed the limitations, risks, security and privacy concerns of performing an evaluation and management service by telephone and the availability of in person appointments. I also discussed with the patient that there may be a patient responsible charge related to this service. The patient expressed understanding and agreed to proceed.  Crosby Oyster, RN 05/02/2019  9:43 AM

## 2019-05-02 NOTE — Patient Instructions (Signed)
Please start:  B12- 1040mcg per hematology   For your immune system:  Vitamin D 2000 IU Vitamin C 1000mg   Zinc

## 2019-05-02 NOTE — Telephone Encounter (Signed)
-----   Message from Brunetta Genera, MD sent at 04/30/2019 10:54 PM EST ----- plz let patient know her B12 levels are low at 200 -- plz start taking B12 1000 mcg OTC in addition to prenatal vitamins

## 2019-05-07 ENCOUNTER — Other Ambulatory Visit: Payer: Self-pay

## 2019-05-07 ENCOUNTER — Other Ambulatory Visit: Payer: Self-pay | Admitting: Family Medicine

## 2019-05-07 ENCOUNTER — Other Ambulatory Visit (HOSPITAL_COMMUNITY): Payer: Self-pay | Admitting: *Deleted

## 2019-05-07 ENCOUNTER — Ambulatory Visit (HOSPITAL_COMMUNITY)
Admission: RE | Admit: 2019-05-07 | Discharge: 2019-05-07 | Disposition: A | Discharge status: Home / Self Care | Payer: BLUE CROSS/BLUE SHIELD | Source: Ambulatory Visit | Attending: Family Medicine | Admitting: Family Medicine

## 2019-05-07 DIAGNOSIS — Z3A19 19 weeks gestation of pregnancy: Secondary | ICD-10-CM

## 2019-05-07 DIAGNOSIS — O09212 Supervision of pregnancy with history of pre-term labor, second trimester: Secondary | ICD-10-CM | POA: Diagnosis not present

## 2019-05-07 DIAGNOSIS — D696 Thrombocytopenia, unspecified: Secondary | ICD-10-CM

## 2019-05-07 DIAGNOSIS — O09292 Supervision of pregnancy with other poor reproductive or obstetric history, second trimester: Secondary | ICD-10-CM | POA: Diagnosis not present

## 2019-05-07 DIAGNOSIS — O99212 Obesity complicating pregnancy, second trimester: Secondary | ICD-10-CM

## 2019-05-07 DIAGNOSIS — O0990 Supervision of high risk pregnancy, unspecified, unspecified trimester: Secondary | ICD-10-CM | POA: Diagnosis not present

## 2019-05-07 DIAGNOSIS — O99112 Other diseases of the blood and blood-forming organs and certain disorders involving the immune mechanism complicating pregnancy, second trimester: Secondary | ICD-10-CM

## 2019-05-07 DIAGNOSIS — Z362 Encounter for other antenatal screening follow-up: Secondary | ICD-10-CM

## 2019-05-30 ENCOUNTER — Telehealth (INDEPENDENT_AMBULATORY_CARE_PROVIDER_SITE_OTHER): Payer: BLUE CROSS/BLUE SHIELD | Admitting: Family Medicine

## 2019-05-30 ENCOUNTER — Other Ambulatory Visit: Payer: Self-pay

## 2019-05-30 ENCOUNTER — Encounter: Payer: Self-pay | Admitting: Family Medicine

## 2019-05-30 VITALS — BP 126/72 | HR 73

## 2019-05-30 DIAGNOSIS — O0990 Supervision of high risk pregnancy, unspecified, unspecified trimester: Secondary | ICD-10-CM

## 2019-05-30 DIAGNOSIS — D696 Thrombocytopenia, unspecified: Secondary | ICD-10-CM

## 2019-05-30 DIAGNOSIS — Z98891 History of uterine scar from previous surgery: Secondary | ICD-10-CM

## 2019-05-30 DIAGNOSIS — Z8751 Personal history of pre-term labor: Secondary | ICD-10-CM

## 2019-05-30 DIAGNOSIS — O0992 Supervision of high risk pregnancy, unspecified, second trimester: Secondary | ICD-10-CM

## 2019-05-30 DIAGNOSIS — Z8759 Personal history of other complications of pregnancy, childbirth and the puerperium: Secondary | ICD-10-CM

## 2019-05-30 DIAGNOSIS — O99210 Obesity complicating pregnancy, unspecified trimester: Secondary | ICD-10-CM

## 2019-05-30 DIAGNOSIS — O99212 Obesity complicating pregnancy, second trimester: Secondary | ICD-10-CM

## 2019-05-30 DIAGNOSIS — Z3A22 22 weeks gestation of pregnancy: Secondary | ICD-10-CM

## 2019-05-30 NOTE — Progress Notes (Signed)
I connected with  Michele Hayden on 05/30/19 at 10:15 AM EST by telephone and verified that I am speaking with the correct person using two identifiers.   I discussed the limitations, risks, security and privacy concerns of performing an evaluation and management service by telephone and the availability of in person appointments. I also discussed with the patient that there may be a patient responsible charge related to this service. The patient expressed understanding and agreed to proceed.  Crosby Oyster, RN 05/30/2019  10:42 AM   Pt has not started the baby aspirin yet.

## 2019-05-30 NOTE — Progress Notes (Signed)
   TELEHEALTH VIRTUAL OBSTETRICS VISIT ENCOUNTER NOTE  I connected with [place] on 06/03/19 at 10:15 AM EST by telephone at home and verified that I am speaking with the correct person using two identifiers.   I discussed the limitations, risks, security and privacy concerns of performing an evaluation and management service by telephone and the availability of in person appointments. I also discussed with the patient that there may be a patient responsible charge related to this service. The patient expressed understanding and agreed to proceed.  Subjective:  Michele Hayden is a 28 y.o. A4Z6606 at [redacted]w[redacted]d being followed for ongoing prenatal care.  She is currently monitored for the following issues for this high-risk pregnancy and has Obesity in pregnancy; History of placenta abruption; Eczema; Thrombocytopenia ([place]); History of VBAC; History of gestational hypertension; History of preterm delivery; BMI 30s; and Supervision of high risk pregnancy, antepartum on their problem list.  Patient reports no complaints. Reports fetal movement. Denies any contractions, bleeding or leaking of fluid.   The following portions of the patient's history were reviewed and updated as appropriate: allergies, current medications, past family history, past medical history, past social history, past surgical history and problem list.   Objective:   General:  Alert, oriented and cooperative.   Mental Status: Normal mood and affect perceived. Normal judgment and thought content.  Rest of physical exam deferred due to type of encounter  Assessment and Plan:  Pregnancy: [id number] at [redacted]w[redacted]d  1. Supervision of high risk pregnancy, antepartum Up to date Will have 3rd trimester labs next visit  2. History of preterm delivery Using vaginal progesterone intermittently-- 4-6 times per week  3. History of placenta abruption Denies bleeding  4. History of VBAC Desires TOLAC  5. Obesity in pregnancy  TWG= 14 lb 8 oz (6.577 kg)   6. Thrombocytopenia ([place]) Seen by Heme/Onc Unlikely ITP per consult, plt stable Recheck plt at next visit  Preterm labor symptoms and general obstetric precautions including but not limited to vaginal bleeding, contractions, leaking of fluid and fetal movement were reviewed in detail with the patient.  I discussed the assessment and treatment plan with the patient. The patient was provided an opportunity to ask questions and all were answered. The patient agreed with the plan and demonstrated an understanding of the instructions. The patient was advised to call back or seek an in-person office evaluation/go to [facility] at [facility] for any urgent or concerning symptoms. Please refer to After Visit Summary for other counseling recommendations.   I provided 15 minutes of non-face-to-face time during this encounter.  Return in about 4 weeks (around 06/27/2019) for Routine prenatal care, 28 wk labs.  Future Appointments  Date Time Provider [place]  06/04/2019 10:15 AM [place] 4 [facility]-US  06/27/2019 10:15 AM Caren Macadam, MD [facility]  07/03/2019  9:00 AM Brunetta Genera, MD [facility] None    Caren Macadam, [place] for [facility] [place]

## 2019-06-04 ENCOUNTER — Other Ambulatory Visit: Payer: Self-pay

## 2019-06-04 ENCOUNTER — Other Ambulatory Visit (HOSPITAL_COMMUNITY): Payer: Self-pay | Admitting: *Deleted

## 2019-06-04 ENCOUNTER — Ambulatory Visit (HOSPITAL_COMMUNITY)
Admission: RE | Admit: 2019-06-04 | Discharge: 2019-06-04 | Disposition: A | Discharge status: Home / Self Care | Payer: Medicaid Other | Source: Ambulatory Visit | Attending: Obstetrics and Gynecology | Admitting: Obstetrics and Gynecology

## 2019-06-04 DIAGNOSIS — O99112 Other diseases of the blood and blood-forming organs and certain disorders involving the immune mechanism complicating pregnancy, second trimester: Secondary | ICD-10-CM | POA: Diagnosis not present

## 2019-06-04 DIAGNOSIS — O09292 Supervision of pregnancy with other poor reproductive or obstetric history, second trimester: Secondary | ICD-10-CM

## 2019-06-04 DIAGNOSIS — O99212 Obesity complicating pregnancy, second trimester: Secondary | ICD-10-CM

## 2019-06-04 DIAGNOSIS — O09212 Supervision of pregnancy with history of pre-term labor, second trimester: Secondary | ICD-10-CM

## 2019-06-04 DIAGNOSIS — Z3A23 23 weeks gestation of pregnancy: Secondary | ICD-10-CM

## 2019-06-04 DIAGNOSIS — Z362 Encounter for other antenatal screening follow-up: Secondary | ICD-10-CM

## 2019-06-04 DIAGNOSIS — D696 Thrombocytopenia, unspecified: Secondary | ICD-10-CM

## 2019-06-27 ENCOUNTER — Ambulatory Visit (INDEPENDENT_AMBULATORY_CARE_PROVIDER_SITE_OTHER): Payer: BLUE CROSS/BLUE SHIELD | Admitting: Family Medicine

## 2019-06-27 ENCOUNTER — Other Ambulatory Visit: Payer: Self-pay

## 2019-06-27 ENCOUNTER — Encounter: Payer: Self-pay | Admitting: Family Medicine

## 2019-06-27 VITALS — BP 112/75 | HR 82 | Wt 236.0 lb

## 2019-06-27 DIAGNOSIS — O0992 Supervision of high risk pregnancy, unspecified, second trimester: Secondary | ICD-10-CM

## 2019-06-27 DIAGNOSIS — O0990 Supervision of high risk pregnancy, unspecified, unspecified trimester: Secondary | ICD-10-CM

## 2019-06-27 DIAGNOSIS — D696 Thrombocytopenia, unspecified: Secondary | ICD-10-CM

## 2019-06-27 DIAGNOSIS — Z8751 Personal history of pre-term labor: Secondary | ICD-10-CM

## 2019-06-27 DIAGNOSIS — Z23 Encounter for immunization: Secondary | ICD-10-CM

## 2019-06-27 DIAGNOSIS — Z3A26 26 weeks gestation of pregnancy: Secondary | ICD-10-CM

## 2019-06-27 DIAGNOSIS — Z98891 History of uterine scar from previous surgery: Secondary | ICD-10-CM

## 2019-06-27 DIAGNOSIS — O99210 Obesity complicating pregnancy, unspecified trimester: Secondary | ICD-10-CM

## 2019-06-27 DIAGNOSIS — O99212 Obesity complicating pregnancy, second trimester: Secondary | ICD-10-CM

## 2019-06-27 NOTE — Patient Instructions (Signed)
Postpartum Tubal Ligation Postpartum tubal ligation (PPTL) is a procedure to close the fallopian tubes. This is done so that you cannot get pregnant. When the fallopian tubes are closed, the eggs that the ovaries release cannot enter the uterus, and sperm cannot reach the eggs. PPTL is done right after childbirth or 1-2 days after childbirth, before the uterus returns to its normal location. If you have a cesarean section, it can be performed at the same time as the procedure. Having this done after childbirth does not make your stay in the hospital longer. PPTL is sometimes called "getting your tubes tied." You should not have this procedure if you want to get pregnant again or if you are unsure about having more children. Tell a health care provider about:  Any allergies you have.  All medicines you are taking, including vitamins, herbs, eye drops, creams, and over-the-counter medicines.  Any problems you or family members have had with anesthetic medicines.  Any blood disorders you have.  Any surgeries you have had.  Any medical conditions you have or have had.  Any past pregnancies. What are the risks? Generally, this is a safe procedure. However, problems may occur, including:  Infection.  Bleeding.  Injury to other organs in the abdomen.  Side effects from anesthetic medicines.  Failure of the procedure. If this happens, you could get pregnant.  Having a fertilized egg attach outside the uterus (ectopic pregnancy). What happens before the procedure?  Ask your health care provider about: ? How much pain you can expect to have. ? What medicines you will be given for pain, especially if you are planning to breastfeed. What happens during the procedure? If you had a vaginal delivery:  You will be given one or more of the following: ? A medicine to help you relax (sedative). ? A medicine to numb the area (local anesthetic). ? A medicine to make you fall asleep (general  anesthetic). ? A medicine that is injected into an area of your body to numb everything below the injection site (regional anesthetic).  If you have been given a general anesthetic, a tube will be put down your throat to help you breathe.  An IV will be inserted into one of your veins.  Your bladder may be emptied with a small tube (catheter).  An incision will be made just below your belly button.  Your fallopian tubes will be located and brought up through the incision.  Your fallopian tubes will be tied off, burned (cauterized), or blocked with a clip, ring, or clamp. A small part in the center of each fallopian tube may be removed.  The incision will be closed with stitches (sutures).  A bandage (dressing) will be placed over the incision. If you had a cesarean delivery:  Tubal ligation will be done through the incision that was used for the cesarean delivery of your baby.  The incision will be closed with sutures.  A dressing will be placed over the incision. The procedure may vary among health care providers and hospitals. What happens after the procedure?  Your blood pressure, heart rate, breathing rate, and blood oxygen level will be monitored until you leave the hospital.  You will be given pain medicine as needed.  Do not drive for 24 hours if you were given a sedative during your procedure. Summary  Postpartum tubal ligation is a procedure that closes the fallopian tubes so you cannot get pregnant anymore.  This procedure is done while you are still   in the hospital after childbirth. If you have a cesarean section, it can be performed at the same time.  Having this done after childbirth does not make your stay in the hospital longer.  Postpartum tubal ligation is considered permanent. You should not have this procedure if you want to get pregnant again or if you are unsure about having more children.  Talk to your health care provider to see if this procedure is  right for you. This information is not intended to replace advice given to you by your health care provider. Make sure you discuss any questions you have with your health care provider. Document Released: 06/14/2005 Document Revised: 05/04/2018 Document Reviewed: 05/04/2018 Elsevier Patient Education  2020 [employer]  

## 2019-06-27 NOTE — Progress Notes (Signed)
   PRENATAL VISIT NOTE  Subjective:  Michele Hayden is a 28 y.o. [id number] at [redacted]w[redacted]d being seen today for ongoing prenatal care.  She is currently monitored for the following issues for this high-risk pregnancy and has Obesity in pregnancy; History of placenta abruption; Eczema; Thrombocytopenia ([place]); History of VBAC; History of gestational hypertension; History of preterm delivery; BMI 30s; and Supervision of high risk pregnancy, antepartum on their problem list.  Patient reports no complaints.  Contractions: Not present. Vag. Bleeding: None.  Movement: Present. Denies leaking of fluid.   The following portions of the patient's history were reviewed and updated as appropriate: allergies, current medications, past family history, past medical history, past social history, past surgical history and problem list.   Objective:   Vitals:   06/27/19 0852  BP: 112/75  Pulse: 82  Weight: 236 lb (107 kg)   Fetal Status: Fetal Heart Rate (bpm): 135 Fundal Height: 29 cm Movement: Present     General:  Alert, oriented and cooperative. Patient is in no acute distress.  Skin: Skin is warm and dry. No rash noted.   Cardiovascular: Normal heart rate noted  Respiratory: Normal respiratory effort, no problems with respiration noted  Abdomen: Soft, gravid, appropriate for gestational age.  Pain/Pressure: Absent     Pelvic: Cervical exam deferred        Extremities: Normal range of motion.  Edema: None  Mental Status: Normal mood and affect. Normal behavior. Normal judgment and thought content.   Assessment and Plan:  Pregnancy: [id number] at [redacted]w[redacted]d  1. Supervision of high risk pregnancy, antepartum Discussed contraception pp, thinking strongly about BTS. Counseled about other options as well ([facility]). She was given some information  - 2 Hour GTT - CBC - RPR - HIV antibody  2. Obesity in pregnancy TWG= 33 lb (15 kg) which is well above goal. Encouraged health eating and exercise.   3. History of  preterm delivery No sx today, not on 17 P- has vaginal progesterone and is using intermittently  4. History of VBAC Desires repeat TOLAC  5. Thrombocyotpenia- likely gestational - Has follow up with Heme - PLT have been stable >100. Checking today Lab Results  Component Value Date   PLT 114 (L) 04/24/2019   PLT 100 (LL) 03/02/2019   Preterm labor symptoms and general obstetric precautions including but not limited to vaginal bleeding, contractions, leaking of fluid and fetal movement were reviewed in detail with the patient. Please refer to After Visit Summary for other counseling recommendations.   Return in about 2 weeks (around 07/11/2019) for Routine prenatal care, Telehealth/Virtual health OB Visit.  Future Appointments  Date Time Provider [place]  06/27/2019 10:15 AM Caren Macadam, MD [facility]  07/03/2019  9:00 AM Brunetta Genera, MD [facility] None  07/16/2019  8:45 AM [facility] NURSE [place] [facility]  07/16/2019  8:45 AM [place] [facility]    Caren Macadam, MD

## 2019-06-28 LAB — CBC
Hematocrit: 32.1 % — ABNORMAL LOW (ref 34.0–46.6)
Hemoglobin: 11 g/dL — ABNORMAL LOW (ref 11.1–15.9)
MCH: 31.3 pg (ref 26.6–33.0)
MCHC: 34.3 g/dL (ref 31.5–35.7)
MCV: 92 fL (ref 79–97)
Platelets: 94 10*3/uL — CL (ref 150–450)
RBC: 3.51 x10E6/uL — ABNORMAL LOW (ref 3.77–5.28)
RDW: 12.2 % (ref 11.7–15.4)
WBC: 6.6 10*3/uL (ref 3.4–10.8)

## 2019-06-28 LAB — HIV ANTIBODY (ROUTINE TESTING W REFLEX): HIV Screen 4th Generation wRfx: NONREACTIVE

## 2019-06-28 LAB — RPR: RPR Ser Ql: NONREACTIVE

## 2019-06-28 LAB — GLUCOSE TOLERANCE, 2 HOURS W/ 1HR
Glucose, 1 hour: 98 mg/dL (ref 65–179)
Glucose, 2 hour: 84 mg/dL (ref 65–152)
Glucose, Fasting: 78 mg/dL (ref 65–91)

## 2019-06-29 NOTE — L&D Delivery Note (Signed)
OB/GYN Faculty Practice Delivery Note  Michele Hayden is a 29 y.o. [id number] s/p VBAC at [redacted]w[redacted]d. She was admitted for SOL.   ROM: 0h 56m with clear fluid GBS Status: Negative/-- (03/08 1604) Maximum Maternal Temperature: 98.74F  Labor Progress: . Initial SVE: 4.5/50/-3. Patient received epidural, SROM occurred. She then progressed to complete.   Delivery Date/Time: 3/29 @ [phone] Delivery: Called to room and patient was complete and pushing. Head delivered in LOA position. No nuchal cord present. Shoulder and body delivered in usual fashion. Infant with spontaneous cry, placed on mother's abdomen, dried and stimulated. Cord clamped x 2 after 1-minute delay, and cut by patient's support person. Cord blood drawn. Placenta delivered spontaneously with gentle cord traction. Fundus firm with massage and Pitocin. Labia, perineum, vagina, and cervix inspected inspected with small first degree vaginal laceration which was repaired with 3-0 Vicryl in a standard fashion.  Baby Weight: pending  Placenta: Sent to L&D Complications: None Lacerations: None EBL: 88 mL Analgesia: Epidural   Infant:  APGAR (1 MIN): 9  APGAR (5 MINS): 9  APGAR (10 MINS):     Jerilynn Birkenhead, MD [facility] Family Medicine Fellow, [facility] for [facility], [facility] 09/24/2019, 7:37 AM

## 2019-07-02 ENCOUNTER — Telehealth: Payer: Self-pay

## 2019-07-02 NOTE — Telephone Encounter (Signed)
-----   Message from Federico Flake, MD sent at 07/02/2019  8:30 AM EST ----- 2hr wnl. CBC significant for lower plt count. Patient already referred to hematology. Possible gestational thrombocytopenia but appreciate hem input. Other labs negative

## 2019-07-02 NOTE — Progress Notes (Signed)
HEMATOLOGY/ONCOLOGY CLINIC NOTE  Date of Service: 07/03/2019  Patient Care Team: Fleet Contras, MD as PCP - General (Internal Medicine)  CHIEF COMPLAINTS/PURPOSE OF CONSULTATION:  Thrombocytopenia in pregnancy  HISTORY OF PRESENTING ILLNESS:   Michele Hayden is a wonderful 29 y.o. female who has been referred to [place] by Dr Vergie Living for evaluation and management of thrombocytopenia in pregnancy. The pt reports that she is doing well overall.   The pt is 17 weeks and 2 days pregnant today. This is her fourth pregnancy. Her children are 5 and 2 and she had a miscarriage after a placental abruption at about week 15 with her second pregnancy. She had an emergency C-section after the placental abruption but has since had a normal vaginal delivery. There are no current plans for a C-section with her current pregnancy. The pt reports that her PLTs were around 98K 3 years ago after the birth of her last child. She has never been diagnosed with immune thrombocytopenia. Pt is currently using Claritin daily, pre-natal vitamins as prescribed and Tylenol as needed. Pt also uses saline sprays for her allergies. She only took Asprin once on accident. She is not currently taking acid suppressants. Pt is eating well and is trying to eat a balanced diet. She denies any bleeding concerns. Pt is not doing any recreational drugs at this time. 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   Most recent lab results (03/02/2019) of Obstetric Panel, including HIV is as follows: Hepatitis B Surface Ag is "Negative", RPR Ser Ql is "Non Reactive", Rubella Antibodies, IGG at 16.10, ABO Grouping is "O", Rh Factor is "Positive", Antibody Screen is "Negative", HIV Screen 4th Generation wRfx is "Non Reactive", WBC at 6.3K, RBC at 3.97, Hgb at 12.4, HCT at 37.4, MCV at 94, MCH at 31.2, MCHC at 33.2, RDW at 12.0, PLTs at 100K, Neutro Rel at 68, Lymphs Rel at 23, Monos Rel at 8, Eos Rel at 1, Basos Rel at 0, Neutro Abs at 4.3K,  Lymphs Abs at 1.4K, Monos Abs at 0.5K, Eos Abs at 0.1K, Basos Abs at 0.0K, Immature Granulocytes Rel at 0, Abs Immature Granulocytes at 0.0K, Glucose at 98, BUN at 10, Creatinine at 0.74, GFR Est AFR Am at 127, Sodium at 135, Potassium at 4.0, Chloride at 103, CO2 at 21, Calcium at 9.1, Total Protein at 6.5, Albumin at 4.1, TBIL at 0.3, Alkaline Phophatase at 63, AST at 14, ALT at 8.   On review of systems, pt denies mouth sores, abdominal pain, skin rashes  and any other symptoms.   On PMHx the pt reports C-section  On SHx the pt reports discontinued recreational drug usage   INTERVAL HISTORY:   Michele Hayden is a wonderful 29 y.o. female who is here for evaluation and management of thrombocytopenia in pregnancy. The patient's last visit with [place] was on 04/24/2019. The pt reports that she is doing well overall.  Pt is currently 27 weeks and 2 days pregnant. The pt reports that she is concerned about her platelet count and what her low platelets could mean for a C-section and Epidural. Pt had a placental abruption with one of her previous pregnancies and did require a blood transfusion. She has had some increased gum bleeding while brushing her teeth, but no other bleeding concerns. She is now taking supplemental Vitamin C and Vitamin D but denies any other OTC medications.   Lab results (06/27/19) of CBC is as follows: all values are WNL except  for RBC at 3.51, Hgb at 11.0, HCT at 32.1, PLT at 94K.  On review of systems, pt reports gum bleeding and denies abdominal pain, leg swelling, other bleeding concerns and any other symptoms.   MEDICAL HISTORY:  Past Medical History:  Diagnosis Date  . Chlamydia infection complicating pregnancy in second trimester 04/10/2013  . GERD (gastroesophageal reflux disease)   . Pregnancy induced hypertension     SURGICAL HISTORY: Past Surgical History:  Procedure Laterality Date  . CESAREAN SECTION N/A 04/13/2014   Procedure: CESAREAN SECTION;   Surgeon: Tilda BurrowJohn Ferguson V, MD;  Location: [facility];  Service: Obstetrics;  Laterality: N/A;  . DILATION AND CURETTAGE OF UTERUS    . WISDOM TOOTH EXTRACTION      SOCIAL HISTORY: Social History   Socioeconomic History  . Marital status: Single    Spouse name: Not on file  . Number of children: Not on file  . Years of education: Not on file  . Highest education level: Not on file  Occupational History  . Occupation: CNA  Tobacco Use  . Smoking status: Former Smoker    Packs/day: 0.50    Years: 7.00    Pack years: 3.50    Types: Cigarettes    Quit date: 06/28/2014    Years since quitting: 5.0  . Smokeless tobacco: Never Used  Substance and Sexual Activity  . Alcohol use: Not Currently    Alcohol/week: 0.0 standard drinks    Comment: occasional  . Drug use: No    Types: Cocaine, Marijuana    Comment: y- last use as of 02/2015  . Sexual activity: Yes    Birth control/protection: None  Other Topics Concern  . Not on file  Social History Narrative  . Not on file   Social Determinants of Health   Financial Resource Strain:   . Difficulty of Paying Living Expenses: Not on file  Food Insecurity:   . Worried About Programme researcher, broadcasting/film/videounning Out of Food in the Last Year: Not on file  . Ran Out of Food in the Last Year: Not on file  Transportation Needs:   . Lack of Transportation (Medical): Not on file  . Lack of Transportation (Non-Medical): Not on file  Physical Activity:   . Days of Exercise per Week: Not on file  . Minutes of Exercise per Session: Not on file  Stress:   . Feeling of Stress : Not on file  Social Connections:   . Frequency of Communication with Friends and Family: Not on file  . Frequency of Social Gatherings with Friends and Family: Not on file  . Attends Religious Services: Not on file  . Active Member of Clubs or Organizations: Not on file  . Attends BankerClub or Organization Meetings: Not on file  . Marital Status: Not on file  Intimate Partner Violence:   . Fear of  Current or Ex-Partner: Not on file  . Emotionally Abused: Not on file  . Physically Abused: Not on file  . Sexually Abused: Not on file    FAMILY HISTORY: Family History  Problem Relation Age of Onset  . Asthma Brother   . Diabetes Paternal Grandmother   . Kidney disease Paternal Grandmother   . Heart disease Paternal Grandmother   . Heart attack Paternal Grandmother   . Diabetes Maternal Aunt     ALLERGIES:  has No Known Allergies.  MEDICATIONS:  Current Outpatient Medications  Medication Sig Dispense Refill  . cyanocobalamin ([facility] VITAMIN B12) 2000 MCG tablet Take 0.5 tablets (1,000  mcg total) by mouth daily. 30 tablet 11  . Prenatal Vit-Fe Fumarate-FA (MULTIVITAMIN-PRENATAL) 27-0.8 MG TABS tablet Take 1 tablet by mouth daily at 12 noon.    . progesterone (PROMETRIUM) 200 MG capsule Place one capsule vaginally at bedtime 30 capsule 4  . aspirin EC 81 MG tablet Take 1 tablet (81 mg total) by mouth daily. Take after 12 weeks for prevention of preeclampsia later in pregnancy (Patient not taking: Reported on 05/02/2019) 300 tablet 2  . fluticasone (FLONASE) 50 MCG/ACT nasal spray Place 1 spray into both nostrils daily. (Patient not taking: Reported on 01/22/2018) 16 g 2   No current facility-administered medications for this visit.    REVIEW OF SYSTEMS:   A 10+ POINT REVIEW OF SYSTEMS WAS OBTAINED including neurology, dermatology, psychiatry, cardiac, respiratory, lymph, extremities, GI, GU, Musculoskeletal, constitutional, breasts, reproductive, HEENT.  All pertinent positives are noted in the HPI.  All others are negative.   PHYSICAL EXAMINATION: ECOG PERFORMANCE STATUS: 0 - Asymptomatic  . Vitals:   07/03/19 0918  BP: 112/71  Pulse: 72  Resp: 18  Temp: 98 F (36.7 C)  SpO2: 99%   Filed Weights   07/03/19 0918  Weight: 236 lb 3.2 oz (107.1 kg)   .Body mass index is 36.99 kg/m.   Exam was given in a chair   GENERAL:alert, in no acute distress and comfortable SKIN:  no acute rashes, no significant lesions EYES: conjunctiva are pink and non-injected, sclera anicteric OROPHARYNX: MMM, no exudates, no oropharyngeal erythema or ulceration NECK: supple, no JVD LYMPH:  no palpable lymphadenopathy in the cervical, axillary or inguinal regions LUNGS: clear to auscultation b/l with normal respiratory effort HEART: regular rate & rhythm ABDOMEN:  normoactive bowel sounds , non tender, not distended. No palpable hepatosplenomegaly.  Extremity: no pedal edema PSYCH: alert & oriented x 3 with fluent speech NEURO: no focal motor/sensory deficits  LABORATORY DATA:  I have reviewed the data as listed  . CBC Latest Ref Rng & Units 06/27/2019 04/24/2019 04/24/2019  WBC 3.4 - 10.8 x10E3/uL 6.6 7.7 -  Hemoglobin 11.1 - 15.9 g/dL 11.0(L) 11.8(L) -  Hematocrit 34.0 - 46.6 % 32.1(L) 36.2 34.6(L)  Platelets 150 - 450 x10E3/uL 94(LL) 114(L) -    . CMP Latest Ref Rng & Units 04/24/2019 03/02/2019 09/11/2018  Glucose 70 - 99 mg/dL 95 98 88  BUN 6 - 20 mg/dL 10 10 20   Creatinine 0.44 - 1.00 mg/dL 1.61 0.96 0.45  Sodium 135 - 145 mmol/L 139 135 141  Potassium 3.5 - 5.1 mmol/L 4.0 4.0 4.3  Chloride 98 - 111 mmol/L 107 103 105  CO2 22 - 32 mmol/L 24 21 21   Calcium 8.9 - 10.3 mg/dL 4.0(J) 9.1 9.1  Total Protein 6.5 - 8.1 g/dL 6.4(L) 6.5 6.6  Total Bilirubin 0.3 - 1.2 mg/dL <8.1(X) 0.3 <9.1  Alkaline Phos 38 - 126 U/L 63 63 63  AST 15 - 41 U/L 11(L) 14 15  ALT 0 - 44 U/L 8 8 13      RADIOGRAPHIC STUDIES: I have personally reviewed the radiological images as listed and agreed with the findings in the report. [place] MFM OB FOLLOW UP  Result Date: 06/04/2019 ----------------------------------------------------------------------  OBSTETRICS REPORT                       (Signed Final 06/04/2019 12:21 pm) ---------------------------------------------------------------------- Patient Info  ID #:       [id number]  D.O.B.:  January 22, 1991 (27 yrs)  Name:        MELODYE SWOR              Visit Date: 06/04/2019 10:29 am ---------------------------------------------------------------------- Performed By  Performed By:     Sandi Mealy        Ref. Address:     [address]                    [address]                                                             [address]  Attending:        Noralee Space MD        [facility]  Referred By:      [facility] ---------------------------------------------------------------------- Orders   #  Description                          Code         Ordered By   1  [place] MFM OB FOLLOW UP                  [phone]     Rosana Hoes  ----------------------------------------------------------------------   #  Order #                    Accession #                 Episode #   1  [id number]                  [id number]                  [id number]  ---------------------------------------------------------------------- Indications   Obesity complicating pregnancy, second         O99.212   trimester   Poor obstetric history: Previous               O09.299   preeclampsia / eclampsia/gestational HTN   Encounter for antenatal screening for          Z36.3   malformations (low risk NIPS, 4.5FF)   Poor obstetric history: Previous preterm       O09.219   delivery, antepartum ([redacted]w[redacted]d, abruption, c/s)   Thrombocytopenia affecting pregnancy,          O99.119, D69.6   antepartum   [redacted] weeks gestation of pregnancy                Z3A.23  ---------------------------------------------------------------------- Fetal Evaluation  Num Of Fetuses:         1  Fetal Heart Rate(bpm):  144  Cardiac Activity:       Observed  Presentation:           Breech  Placenta:  Posterior  P. Cord Insertion:      Visualized  Amniotic Fluid  AFI FV:      Within normal limits                              Largest Pocket(cm)                              5.83  ---------------------------------------------------------------------- Biometry  BPD:      55.2  mm     G. Age:  22w 6d         33  %    CI:        72.14   %    70 - 86                                                          FL/HC:      20.5   %    19.2 - 20.8  HC:      206.8  mm     G. Age:  22w 5d         22  %    HC/AC:      1.11        1.05 - 1.21  AC:      185.7  mm     G. Age:  23w 3d         49  %    FL/BPD:     76.8   %    71 - 87  FL:       42.4  mm     G. Age:  23w 6d         62  %    FL/AC:      22.8   %    20 - 24  HUM:      39.8  mm     G. Age:  24w 2d         67  %  LV:        4.8  mm  Est. FW:     597  gm      1 lb 5 oz     58  % ---------------------------------------------------------------------- OB History  Gravidity:    6         Term:   2        Prem:   1        SAB:   1  TOP:          1       Ectopic:  0        Living: 2 ---------------------------------------------------------------------- Gestational Age  LMP:           24w 5d        Date:  12/13/18                 EDD:   09/19/19  U/S Today:     23w 2d                                        EDD:  09/29/19  Best:          23w 1d     Det. ByLoman Chroman         EDD:   09/30/19                                      (02/20/19) ---------------------------------------------------------------------- Anatomy  Cranium:               Appears normal``       LVOT:                   Not well visualized  Cavum:                 Appears normal         Aortic Arch:            Previously seen  Ventricles:            Appears normal         Ductal Arch:            Previously seen  Choroid Plexus:        Previously seen        Diaphragm:              Previously seen  Cerebellum:            Previously seen        Stomach:                Appears normal, left                                                                        sided  Posterior Fossa:       Previously seen        Abdomen:                Appears normal  Nuchal Fold:           Not applicable  (>06    Abdominal Wall:         Previously seen                         wks GA)  Face:                  Orbits and profile     Cord Vessels:           Previously seen                         previously seen  Lips:                  Previously seen        Kidneys:                Previously seen  Palate:                Previously seen        Bladder:                Appears normal  Thoracic:              Appears normal         Spine:                  Previously seen  Heart:                 Appears normal         Upper Extremities:      Previously seen                         (4CH, axis, and                         situs)  RVOT:                  Appears normal         Lower Extremities:      Previously seen ---------------------------------------------------------------------- Cervix Uterus Adnexa  Cervix  Length:           3.21  cm.  Normal appearance by transabdominal scan. ---------------------------------------------------------------------- Impression  Patient return for completion of fetal anatomy.  Obstetric  history is significant for a term vaginal delivery followed by  cesarean section at [redacted] weeks gestation because of placental  abruption.  Unfortunately, the infant did not survive.  Subsequently she had a term VBAC of a healthy female infant.  Patient reports no chronic medical conditions.  Amniotic fluid is normal and good fetal activity is seen. Fetal  growth is appropriate for gestational age. Fetal anatomical  survey was completed and appears normal.  BP at our office: 126/72 mm Hg. ---------------------------------------------------------------------- Recommendations  -An appointment was made for her to return in 6 weeks for  fetal growth assessment (poor ob history) and evaluate LVOT  if possible. ----------------------------------------------------------------------                  Noralee Spaceavi Shankar, MD Electronically Signed Final Report   06/04/2019 12:21 pm  ----------------------------------------------------------------------   ASSESSMENT & PLAN:   29 yo with    1) Thrombocytopenia in 2nd trimester of pregnancy Cannot r/o ITP  PLAN: -Discussed pt labwork, 06/27/19; all values are WNL except for RBC at 3.51, Hgb at 11.0, HCT at 32.1, PLT at 94K. -Advised pt that thrombocytopenia does appear to be atleast partly due to pseudothrombocytopenia, counts are higher when collected in a citrate tube -Advised pt that bleeding concerns don't typically occur unless PLTs <50K and spontaneous bleeding when PLTs <20K -Advised pt that PLT of 75K-80K would be sufficient for a spinal Epidural -Recommended pt avoid OTC medications whenever possible  -Recommended pt to continue to eat balanced diet and take pre-natal vitamins  - will continue to optimize nutrition -Will see back in 7 weeks with labs   FOLLOW UP: RTC with Dr Candise CheKale with labs in 7 weeks   The total time spent in the appt was 15 minutes and more than 50% was on counseling and direct patient cares.  All of the patient's questions were answered with apparent satisfaction. The patient knows to call the clinic with any problems, questions or concerns.    Wyvonnia LoraGautam Aadam Zhen MD MS AAHIVMS [facility] Hematology/Oncology Physician [facility]  (Office):       [phone] (Work cell):  [phone] (Fax):           [phone]  07/03/2019 9:53 AM  I, Carollee HerterJazzmine Knight, am acting  as a scribe for Dr. Sullivan Lone.   .I have reviewed the above documentation for accuracy and completeness, and I agree with the above. Brunetta Genera MD

## 2019-07-02 NOTE — Telephone Encounter (Signed)
Call patient to inform her of lab results and recommendation at this time. Patient has appointment with hematology on 07/03/2019 to follow up on cbc counts. This will help the plan of care regarding her pregnancy.

## 2019-07-03 ENCOUNTER — Encounter: Payer: Self-pay | Admitting: Hematology

## 2019-07-03 ENCOUNTER — Other Ambulatory Visit: Payer: Self-pay

## 2019-07-03 ENCOUNTER — Inpatient Hospital Stay: Payer: BLUE CROSS/BLUE SHIELD | Attending: Hematology | Admitting: Hematology

## 2019-07-03 VITALS — BP 112/71 | HR 72 | Temp 98.0°F | Resp 18 | Ht 67.0 in | Wt 236.2 lb

## 2019-07-03 DIAGNOSIS — D696 Thrombocytopenia, unspecified: Secondary | ICD-10-CM | POA: Diagnosis present

## 2019-07-03 DIAGNOSIS — O99112 Other diseases of the blood and blood-forming organs and certain disorders involving the immune mechanism complicating pregnancy, second trimester: Secondary | ICD-10-CM | POA: Insufficient documentation

## 2019-07-03 DIAGNOSIS — D693 Immune thrombocytopenic purpura: Secondary | ICD-10-CM | POA: Diagnosis not present

## 2019-07-04 ENCOUNTER — Telehealth: Payer: Self-pay | Admitting: Hematology

## 2019-07-04 NOTE — Telephone Encounter (Signed)
Scheduled appt per 1/5 los, sent a message to HIM pool to get a calendar mailed out. 

## 2019-07-11 ENCOUNTER — Other Ambulatory Visit: Payer: Self-pay

## 2019-07-11 ENCOUNTER — Telehealth (INDEPENDENT_AMBULATORY_CARE_PROVIDER_SITE_OTHER): Payer: BLUE CROSS/BLUE SHIELD | Admitting: Family Medicine

## 2019-07-11 VITALS — BP 128/68 | HR 87 | Wt 236.0 lb

## 2019-07-11 DIAGNOSIS — Z3A28 28 weeks gestation of pregnancy: Secondary | ICD-10-CM

## 2019-07-11 DIAGNOSIS — O0993 Supervision of high risk pregnancy, unspecified, third trimester: Secondary | ICD-10-CM

## 2019-07-11 DIAGNOSIS — O0990 Supervision of high risk pregnancy, unspecified, unspecified trimester: Secondary | ICD-10-CM

## 2019-07-11 DIAGNOSIS — O99213 Obesity complicating pregnancy, third trimester: Secondary | ICD-10-CM

## 2019-07-11 DIAGNOSIS — D696 Thrombocytopenia, unspecified: Secondary | ICD-10-CM

## 2019-07-11 DIAGNOSIS — Z8751 Personal history of pre-term labor: Secondary | ICD-10-CM

## 2019-07-11 DIAGNOSIS — O99210 Obesity complicating pregnancy, unspecified trimester: Secondary | ICD-10-CM

## 2019-07-11 NOTE — Progress Notes (Signed)
I connected with@ on 07/11/19 at 10:15 AM EST by: MyChart and verified that I am speaking with the correct person using two identifiers.  Patient is located at Home and provider is located at [employer].     The purpose of this virtual visit is to provide medical care while limiting exposure to the novel coronavirus. I discussed the limitations, risks, security and privacy concerns of performing an evaluation and management service by MyChart and the availability of in person appointments. I also discussed with the patient that there may be a patient responsible charge related to this service. By engaging in this virtual visit, you consent to the provision of healthcare.  Additionally, you authorize for your insurance to be billed for the services provided during this visit.  The patient expressed understanding and agreed to proceed.  The following staff members participated in the virtual visit:  Demetrice Cheree Ditto    PRENATAL VISIT NOTE  Subjective:  Michele Hayden is a 29 y.o. T3S2876 at [redacted]w[redacted]d  for phone visit for ongoing prenatal care.  She is currently monitored for the following issues for this high-risk pregnancy and has Obesity in pregnancy; History of placenta abruption; Eczema; Thrombocytopenia (HCC); History of VBAC; History of gestational hypertension; History of preterm delivery; BMI 30s; and Supervision of high risk pregnancy, antepartum on their problem list.  Patient reports no complaints.  Contractions: Not present.  .  Movement: Present. Denies leaking of fluid.  Using progesterone- reports discharge. Stopped about 2 wks ago. Reports 3 weeks ago using 6 days.  Questions about PLT and concerns about safety of surgery were she to need it  The following portions of the patient's history were reviewed and updated as appropriate: allergies, current medications, past family history, past medical history, past social history, past surgical history and problem list.   Objective:    Vitals:   07/11/19 1022  BP: 128/68  Pulse: 87  Weight: 236 lb (107 kg)   Self-Obtained  Fetal Status:     Movement: Present     Assessment and Plan:  Pregnancy: O1L5726 at [redacted]w[redacted]d.diagmed 1. Supervision of high risk pregnancy, antepartum UTD Reviewed pregnancy box Strongly desires BTL  2. Obesity in pregnancy TWG=33 lb (15 kg)   3. History of preterm delivery Encouraged progesterone use  4. Thrombocytopenia (HCC) Counseled about thresholds for intervention  Reviewed heme consult in detail with patient    Preterm labor symptoms and general obstetric precautions including but not limited to vaginal bleeding, contractions, leaking of fluid and fetal movement were reviewed in detail with the patient.  Return in about 2 weeks (around 07/25/2019) for Routine prenatal care, Telehealth/Virtual health OB Visit.  Future Appointments  Date Time Provider Department Center  07/16/2019  8:45 AM [facility] NURSE [facility]  07/16/2019  8:45 AM [facility] [place] 2 [facility]  07/25/2019 10:15 AM Federico Flake, MD [facility]  08/21/2019 10:00 AM [facility] LAB 2 [facility] None  08/21/2019 10:40 AM [name], MD [facility] None     Time spent on virtual visit: 17 minutes  Federico Flake, MD

## 2019-07-11 NOTE — Progress Notes (Signed)
Would like to discuss progesterone

## 2019-07-16 ENCOUNTER — Encounter (HOSPITAL_COMMUNITY): Payer: Self-pay | Admitting: *Deleted

## 2019-07-16 ENCOUNTER — Other Ambulatory Visit (HOSPITAL_COMMUNITY): Payer: Self-pay | Admitting: *Deleted

## 2019-07-16 ENCOUNTER — Other Ambulatory Visit: Payer: Self-pay

## 2019-07-16 ENCOUNTER — Ambulatory Visit (HOSPITAL_COMMUNITY): Payer: Medicaid Other | Admitting: *Deleted

## 2019-07-16 ENCOUNTER — Ambulatory Visit (HOSPITAL_COMMUNITY)
Admission: RE | Admit: 2019-07-16 | Discharge: 2019-07-16 | Disposition: A | Discharge status: Home / Self Care | Payer: Medicaid Other | Source: Ambulatory Visit | Attending: Obstetrics and Gynecology | Admitting: Obstetrics and Gynecology

## 2019-07-16 DIAGNOSIS — O09213 Supervision of pregnancy with history of pre-term labor, third trimester: Secondary | ICD-10-CM | POA: Diagnosis not present

## 2019-07-16 DIAGNOSIS — Z8751 Personal history of pre-term labor: Secondary | ICD-10-CM

## 2019-07-16 DIAGNOSIS — O09212 Supervision of pregnancy with history of pre-term labor, second trimester: Secondary | ICD-10-CM | POA: Insufficient documentation

## 2019-07-16 DIAGNOSIS — O99213 Obesity complicating pregnancy, third trimester: Secondary | ICD-10-CM | POA: Diagnosis not present

## 2019-07-16 DIAGNOSIS — O0990 Supervision of high risk pregnancy, unspecified, unspecified trimester: Secondary | ICD-10-CM

## 2019-07-16 DIAGNOSIS — O09293 Supervision of pregnancy with other poor reproductive or obstetric history, third trimester: Secondary | ICD-10-CM | POA: Diagnosis not present

## 2019-07-16 DIAGNOSIS — O99113 Other diseases of the blood and blood-forming organs and certain disorders involving the immune mechanism complicating pregnancy, third trimester: Secondary | ICD-10-CM

## 2019-07-16 DIAGNOSIS — Z3A29 29 weeks gestation of pregnancy: Secondary | ICD-10-CM

## 2019-07-16 DIAGNOSIS — D696 Thrombocytopenia, unspecified: Secondary | ICD-10-CM

## 2019-07-25 ENCOUNTER — Other Ambulatory Visit: Payer: Self-pay

## 2019-07-25 ENCOUNTER — Telehealth (INDEPENDENT_AMBULATORY_CARE_PROVIDER_SITE_OTHER): Payer: Medicaid Other | Admitting: Family Medicine

## 2019-07-25 ENCOUNTER — Encounter: Payer: Self-pay | Admitting: Family Medicine

## 2019-07-25 VITALS — BP 128/76 | HR 78

## 2019-07-25 DIAGNOSIS — O0993 Supervision of high risk pregnancy, unspecified, third trimester: Secondary | ICD-10-CM

## 2019-07-25 DIAGNOSIS — O0990 Supervision of high risk pregnancy, unspecified, unspecified trimester: Secondary | ICD-10-CM

## 2019-07-25 DIAGNOSIS — D696 Thrombocytopenia, unspecified: Secondary | ICD-10-CM

## 2019-07-25 DIAGNOSIS — Z3A30 30 weeks gestation of pregnancy: Secondary | ICD-10-CM

## 2019-07-25 DIAGNOSIS — Z98891 History of uterine scar from previous surgery: Secondary | ICD-10-CM

## 2019-07-25 NOTE — Progress Notes (Signed)
Blood pressure 128/76 P

## 2019-07-25 NOTE — Progress Notes (Signed)
I connected with@ on 07/25/19 at 10:15 AM EST by: Mychart and verified that I am speaking with the correct person using two identifiers.  Patient is located at Home and provider is located at [employer].     The purpose of this virtual visit is to provide medical care while limiting exposure to the novel coronavirus. I discussed the limitations, risks, security and privacy concerns of performing an evaluation and management service by Mychart and the availability of in person appointments. I also discussed with the patient that there may be a patient responsible charge related to this service. By engaging in this virtual visit, you consent to the provision of healthcare.  Additionally, you authorize for your insurance to be billed for the services provided during this visit.  The patient expressed understanding and agreed to proceed.  The following staff members participated in the virtual visit:  Demetrice     PRENATAL VISIT NOTE  Subjective:  Michele Hayden is a 29 y.o. F7T0240 at [redacted]w[redacted]d  for phone visit for ongoing prenatal care.  She is currently monitored for the following issues for this high-risk pregnancy and has Obesity in pregnancy; History of placenta abruption; Eczema; Thrombocytopenia (HCC); History of VBAC; History of gestational hypertension; History of preterm delivery; BMI 30s; and Supervision of high risk pregnancy, antepartum on their problem list.  Patient reports no complaints.  Contractions: Not present.  .  Movement: Present. Denies leaking of fluid.   The following portions of the patient's history were reviewed and updated as appropriate: allergies, current medications, past family history, past medical history, past social history, past surgical history and problem list.   Objective:   Vitals:   07/25/19 1055  BP: 128/76  Pulse: 78   Self-Obtained  Fetal Status:     Movement: Present     Assessment and Plan:  Pregnancy: [id number] at [redacted]w[redacted]d  1. Supervision of high  risk pregnancy, antepartum Reviewed plan of care  Discussed masking in labor per patient questions Reviewed plan for [place]   2. History of VBAC Desires TOLAC  3. Thrombocytopenia (HCC) Stable, in heme care Knows about follow up  Preterm labor symptoms and general obstetric precautions including but not limited to vaginal bleeding, contractions, leaking of fluid and fetal movement were reviewed in detail with the patient.  Return in about 2 weeks (around 08/08/2019) for Routine prenatal care, Telehealth/Virtual health OB Visit.  Future Appointments  Date Time Provider Department Center  08/08/2019 11:00 AM Federico Flake, MD [facility]  08/20/2019  9:10 AM [facility] NURSE [facility]  08/20/2019  9:15 AM [facility] [place] 4 [facility]  08/21/2019 10:00 AM [facility] LAB 2 [facility] None  08/21/2019 10:40 AM [name], MD [facility] None     Time spent on virtual visit: 15 minutes  Federico Flake, MD

## 2019-08-02 ENCOUNTER — Telehealth: Payer: Self-pay | Admitting: *Deleted

## 2019-08-02 NOTE — Telephone Encounter (Signed)
Pt had called concerned about feeling more tired and was wondering if she needed to repeat her CBC. Discussed with Dr Macon Large, pt can go ahead an start OTC iron supplement and when she gets her CBC redrawn in a few weeks we can see what those results show. Pt is good with that plan, and will start the iron.

## 2019-08-08 ENCOUNTER — Encounter: Payer: Self-pay | Admitting: Family Medicine

## 2019-08-08 ENCOUNTER — Other Ambulatory Visit: Payer: Self-pay

## 2019-08-08 ENCOUNTER — Telehealth (INDEPENDENT_AMBULATORY_CARE_PROVIDER_SITE_OTHER): Payer: BLUE CROSS/BLUE SHIELD | Admitting: Family Medicine

## 2019-08-08 VITALS — BP 137/85 | HR 84

## 2019-08-08 DIAGNOSIS — Z98891 History of uterine scar from previous surgery: Secondary | ICD-10-CM

## 2019-08-08 DIAGNOSIS — Z8759 Personal history of other complications of pregnancy, childbirth and the puerperium: Secondary | ICD-10-CM

## 2019-08-08 DIAGNOSIS — O34219 Maternal care for unspecified type scar from previous cesarean delivery: Secondary | ICD-10-CM

## 2019-08-08 DIAGNOSIS — Z8751 Personal history of pre-term labor: Secondary | ICD-10-CM

## 2019-08-08 DIAGNOSIS — Z3A32 32 weeks gestation of pregnancy: Secondary | ICD-10-CM

## 2019-08-08 DIAGNOSIS — O99210 Obesity complicating pregnancy, unspecified trimester: Secondary | ICD-10-CM

## 2019-08-08 DIAGNOSIS — O09293 Supervision of pregnancy with other poor reproductive or obstetric history, third trimester: Secondary | ICD-10-CM

## 2019-08-08 DIAGNOSIS — O0990 Supervision of high risk pregnancy, unspecified, unspecified trimester: Secondary | ICD-10-CM

## 2019-08-08 NOTE — Progress Notes (Signed)
I connected with@ on 08/08/19 at 11:00 AM EST by: MyChart and verified that I am speaking with the correct person using two identifiers.  Patient is located at Home and provider is located at [facility].     The purpose of this virtual visit is to provide medical care while limiting exposure to the novel coronavirus. I discussed the limitations, risks, security and privacy concerns of performing an evaluation and management service by Mychart and the availability of in person appointments. I also discussed with the patient that there may be a patient responsible charge related to this service. By engaging in this virtual visit, you consent to the provision of healthcare.  Additionally, you authorize for your insurance to be billed for the services provided during this visit.  The patient expressed understanding and agreed to proceed.  The following staff members participated in the virtual visit:  Scheryl Marten RN    PRENATAL VISIT NOTE  Subjective:  Michele Hayden is a 29 y.o. [id number] at [redacted]w[redacted]d  for phone visit for ongoing prenatal care.  She is currently monitored for the following issues for this high-risk pregnancy and has Obesity in pregnancy; History of placenta abruption; Eczema; Thrombocytopenia (HCC); History of VBAC; History of gestational hypertension; History of preterm delivery; BMI 30s; and Supervision of high risk pregnancy, antepartum on their problem list.  Patient reports no complaints- reports feeling "shaky" and checked glucose- 6. Ate and rechedk and it was 100.  Contractions: Not present. Vag. Bleeding: None.  Movement: Present. Denies leaking of fluid.   The following portions of the patient's history were reviewed and updated as appropriate: allergies, current medications, past family history, past medical history, past social history, past surgical history and problem list.   Objective:   Vitals:   08/08/19 1118  BP: 137/85  Pulse: 84   Self-Obtained  Fetal Status:      Movement: Present     Assessment and Plan:  Pregnancy: [id number] at [redacted]w[redacted]d  1. Supervision of high risk pregnancy, antepartum Up to date Reports one low BG- encouraged her to add protein snack every 2-3 hours Has hematology f/u scheduled 2/23  2. History of VBAC Desires repeat  3. History of gestational hypertension BP today slightly above baseline Asked patient to check BP again in the next week and call office if >140/90 Next appt in person given concern for rising BP and possible need for labs (will have CBC at heme that same day regardless and would only need UPC and CMP if BP elevated).   4. History of preterm delivery  5. History of placenta abruption  6. Obesity in pregnancy TWg= 33 lb (15 kg)   Preterm labor symptoms and general obstetric precautions including but not limited to vaginal bleeding, contractions, leaking of fluid and fetal movement were reviewed in detail with the patient.  Return in about 2 weeks (around 08/22/2019) for Routine prenatal care, in person, BP check.  Future Appointments  Date Time Provider Department Center  08/20/2019  9:10 AM [facility] NURSE [facility]  08/20/2019  9:15 AM [facility] [place] 4 [facility]  08/21/2019 10:00 AM [facility] LAB 2 [facility] None  08/21/2019 10:40 AM [name], MD [facility] None  08/21/2019  1:15 PM [name], MD [facility]    Time spent on virtual visit: 15 minutes  Federico Flake, MD

## 2019-08-08 NOTE — Progress Notes (Signed)
I connected with  Michele Hayden on 08/08/19 at 11:00 AM EST by telephone and verified that I am speaking with the correct person using two identifiers.   I discussed the limitations, risks, security and privacy concerns of performing an evaluation and management service by telephone and the availability of in person appointments. I also discussed with the patient that there may be a patient responsible charge related to this service. The patient expressed understanding and agreed to proceed.  Scheryl Marten, RN 08/08/2019  11:19 AM   Had some swelling just on left side of body, seems better now

## 2019-08-20 ENCOUNTER — Ambulatory Visit (HOSPITAL_COMMUNITY)
Admission: RE | Admit: 2019-08-20 | Discharge: 2019-08-20 | Disposition: A | Discharge status: Home / Self Care | Payer: Medicaid Other | Source: Ambulatory Visit | Attending: Obstetrics and Gynecology | Admitting: Obstetrics and Gynecology

## 2019-08-20 ENCOUNTER — Encounter (HOSPITAL_COMMUNITY): Payer: Self-pay

## 2019-08-20 ENCOUNTER — Other Ambulatory Visit: Payer: Self-pay

## 2019-08-20 ENCOUNTER — Ambulatory Visit (HOSPITAL_COMMUNITY): Payer: Medicaid Other | Admitting: *Deleted

## 2019-08-20 DIAGNOSIS — Z3A34 34 weeks gestation of pregnancy: Secondary | ICD-10-CM

## 2019-08-20 DIAGNOSIS — Z362 Encounter for other antenatal screening follow-up: Secondary | ICD-10-CM | POA: Diagnosis not present

## 2019-08-20 DIAGNOSIS — O99213 Obesity complicating pregnancy, third trimester: Secondary | ICD-10-CM

## 2019-08-20 DIAGNOSIS — O0990 Supervision of high risk pregnancy, unspecified, unspecified trimester: Secondary | ICD-10-CM

## 2019-08-20 DIAGNOSIS — Z8751 Personal history of pre-term labor: Secondary | ICD-10-CM | POA: Insufficient documentation

## 2019-08-20 NOTE — Progress Notes (Signed)
HEMATOLOGY/ONCOLOGY CLINIC NOTE  Date of Service: 08/21/2019  Patient Care Team: Fleet Contras, MD as PCP - General (Internal Medicine)  CHIEF COMPLAINTS/PURPOSE OF CONSULTATION:  Thrombocytopenia in pregnancy  HISTORY OF PRESENTING ILLNESS:   Michele Hayden is a wonderful 29 y.o. female who has been referred to [place] by Dr Vergie Living for evaluation and management of thrombocytopenia in pregnancy. The pt reports that she is doing well overall.   The pt is 17 weeks and 2 days pregnant today. This is her fourth pregnancy. Her children are 5 and 2 and she had a miscarriage after a placental abruption at about week 15 with her second pregnancy. She had an emergency C-section after the placental abruption but has since had a normal vaginal delivery. There are no current plans for a C-section with her current pregnancy. The pt reports that her PLTs were around 98K 3 years ago after the birth of her last child. She has never been diagnosed with immune thrombocytopenia. Pt is currently using Claritin daily, pre-natal vitamins as prescribed and Tylenol as needed. Pt also uses saline sprays for her allergies. She only took Asprin once on accident. She is not currently taking acid suppressants. Pt is eating well and is trying to eat a balanced diet. She denies any bleeding concerns. Pt is not doing any recreational drugs at this time. 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   Most recent lab results (03/02/2019) of Obstetric Panel, including HIV is as follows: Hepatitis B Surface Ag is "Negative", RPR Ser Ql is "Non Reactive", Rubella Antibodies, IGG at 16.10, ABO Grouping is "O", Rh Factor is "Positive", Antibody Screen is "Negative", HIV Screen 4th Generation wRfx is "Non Reactive", WBC at 6.3K, RBC at 3.97, Hgb at 12.4, HCT at 37.4, MCV at 94, MCH at 31.2, MCHC at 33.2, RDW at 12.0, PLTs at 100K, Neutro Rel at 68, Lymphs Rel at 23, Monos Rel at 8, Eos Rel at 1, Basos Rel at 0, Neutro Abs at 4.3K,  Lymphs Abs at 1.4K, Monos Abs at 0.5K, Eos Abs at 0.1K, Basos Abs at 0.0K, Immature Granulocytes Rel at 0, Abs Immature Granulocytes at 0.0K, Glucose at 98, BUN at 10, Creatinine at 0.74, GFR Est AFR Am at 127, Sodium at 135, Potassium at 4.0, Chloride at 103, CO2 at 21, Calcium at 9.1, Total Protein at 6.5, Albumin at 4.1, TBIL at 0.3, Alkaline Phophatase at 63, AST at 14, ALT at 8.   On review of systems, pt denies mouth sores, abdominal pain, skin rashes  and any other symptoms.   On PMHx the pt reports C-section  On SHx the pt reports discontinued recreational drug usage   INTERVAL HISTORY:   Michele A Gillson is a wonderful 29 y.o. female who is here for evaluation and management of thrombocytopenia in pregnancy. The patient's last visit with [place] was on 07/03/2019. The pt reports that she is doing well overall.  The pt reports that she has been feeling well and gaining weight appropriately. She has continued to take her prenatal vitamins, as well as her Vitamin B12. Pt denies any bleeding concerns.   Lab results today (08/21/19) of CBC w/diff and CMP is as follows: all values are WNL except for RBC at 3.42, Hgb at 10.5, HCT at 32.2, PLT at 98K, CO2 at 119, Total Protein at 6.4, Albumin at 2.8, AST at 11. 08/21/2019 Immature Platelet Fraction at 17.3  On review of systems, pt denies pain or swelling in forearms, abdominal  pain, leg swelling, bleeding concerns and any other symptoms.   MEDICAL HISTORY:  Past Medical History:  Diagnosis Date  . Chlamydia infection complicating pregnancy in second trimester 04/10/2013  . GERD (gastroesophageal reflux disease)   . History of substance abuse (HCC)   . Pregnancy induced hypertension     SURGICAL HISTORY: Past Surgical History:  Procedure Laterality Date  . CESAREAN SECTION N/A 04/13/2014   Procedure: CESAREAN SECTION;  Surgeon: Tilda BurrowJohn Ferguson V, MD;  Location: [facility];  Service: Obstetrics;  Laterality: N/A;  . DILATION AND CURETTAGE OF  UTERUS    . WISDOM TOOTH EXTRACTION      SOCIAL HISTORY: Social History   Socioeconomic History  . Marital status: Single    Spouse name: Not on file  . Number of children: Not on file  . Years of education: Not on file  . Highest education level: Not on file  Occupational History  . Occupation: CNA  Tobacco Use  . Smoking status: Former Smoker    Packs/day: 0.50    Years: 7.00    Pack years: 3.50    Types: Cigarettes    Quit date: 06/28/2014    Years since quitting: 5.1  . Smokeless tobacco: Never Used  Substance and Sexual Activity  . Alcohol use: Not Currently    Alcohol/week: 0.0 standard drinks    Comment: occasional  . Drug use: No    Types: Cocaine, Marijuana    Comment: y- last use as of 02/2015  . Sexual activity: Yes    Birth control/protection: None  Other Topics Concern  . Not on file  Social History Narrative  . Not on file   Social Determinants of Health   Financial Resource Strain:   . Difficulty of Paying Living Expenses: Not on file  Food Insecurity:   . Worried About Programme researcher, broadcasting/film/videounning Out of Food in the Last Year: Not on file  . Ran Out of Food in the Last Year: Not on file  Transportation Needs:   . Lack of Transportation (Medical): Not on file  . Lack of Transportation (Non-Medical): Not on file  Physical Activity:   . Days of Exercise per Week: Not on file  . Minutes of Exercise per Session: Not on file  Stress:   . Feeling of Stress : Not on file  Social Connections:   . Frequency of Communication with Friends and Family: Not on file  . Frequency of Social Gatherings with Friends and Family: Not on file  . Attends Religious Services: Not on file  . Active Member of Clubs or Organizations: Not on file  . Attends BankerClub or Organization Meetings: Not on file  . Marital Status: Not on file  Intimate Partner Violence:   . Fear of Current or Ex-Partner: Not on file  . Emotionally Abused: Not on file  . Physically Abused: Not on file  . Sexually  Abused: Not on file    FAMILY HISTORY: Family History  Problem Relation Age of Onset  . Asthma Brother   . Diabetes Paternal Grandmother   . Kidney disease Paternal Grandmother   . Heart disease Paternal Grandmother   . Heart attack Paternal Grandmother   . Diabetes Maternal Aunt     ALLERGIES:  has No Known Allergies.  MEDICATIONS:  Current Outpatient Medications  Medication Sig Dispense Refill  . Cetirizine HCl (ZYRTEC PO) Take by mouth.    . Prenatal Vit-Fe Fumarate-FA (MULTIVITAMIN-PRENATAL) 27-0.8 MG TABS tablet Take 1 tablet by mouth daily at 12 noon.    .Marland Kitchen  progesterone (PROMETRIUM) 200 MG capsule Place one capsule vaginally at bedtime 30 capsule 4  . cyanocobalamin ([employer] VITAMIN B12) 2000 MCG tablet Take 0.5 tablets (1,000 mcg total) by mouth daily. (Patient not taking: Reported on 07/11/2019) 30 tablet 11  . fluticasone (FLONASE) 50 MCG/ACT nasal spray Place 1 spray into both nostrils daily. (Patient not taking: Reported on 01/22/2018) 16 g 2   No current facility-administered medications for this visit.    REVIEW OF SYSTEMS:   A 10+ POINT REVIEW OF SYSTEMS WAS OBTAINED including neurology, dermatology, psychiatry, cardiac, respiratory, lymph, extremities, GI, GU, Musculoskeletal, constitutional, breasts, reproductive, HEENT.  All pertinent positives are noted in the HPI.  All others are negative.   PHYSICAL EXAMINATION: ECOG PERFORMANCE STATUS: 0 - Asymptomatic  . Vitals:   08/21/19 1121  BP: 128/74  Pulse: 88  Resp: 18  Temp: 98.9 F (37.2 C)  SpO2: 100%   Filed Weights   08/21/19 1121  Weight: 253 lb 6.4 oz (114.9 kg)   .Body mass index is 39.69 kg/m.   Exam was given in a chair   GENERAL:alert, in no acute distress and comfortable SKIN: no acute rashes, no significant lesions EYES: conjunctiva are pink and non-injected, sclera anicteric OROPHARYNX: MMM, no exudates, no oropharyngeal erythema or ulceration NECK: supple, no JVD LYMPH:  no palpable  lymphadenopathy in the cervical, axillary or inguinal regions LUNGS: clear to auscultation b/l with normal respiratory effort HEART: regular rate & rhythm ABDOMEN:  normoactive bowel sounds , non tender, not distended. No palpable hepatosplenomegaly.  Extremity: no pedal edema PSYCH: alert & oriented x 3 with fluent speech NEURO: no focal motor/sensory deficits  LABORATORY DATA:  I have reviewed the data as listed  . CBC Latest Ref Rng & Units 08/21/2019 06/27/2019 04/24/2019  WBC 4.0 - 10.5 K/uL 6.9 6.6 7.7  Hemoglobin 12.0 - 15.0 g/dL 10.5(L) 11.0(L) 11.8(L)  Hematocrit 36.0 - 46.0 % 32.2(L) 32.1(L) 36.2  Platelets 150 - 400 K/uL 98(L) 94(LL) 114(L)    . CMP Latest Ref Rng & Units 08/21/2019 04/24/2019 03/02/2019  Glucose 70 - 99 mg/dL 492(E) 95 98  BUN 6 - 20 mg/dL 8 10 10   Creatinine 0.44 - 1.00 mg/dL 1.00 7.12 1.97  Sodium 135 - 145 mmol/L 137 139 135  Potassium 3.5 - 5.1 mmol/L 4.4 4.0 4.0  Chloride 98 - 111 mmol/L 107 107 103  CO2 22 - 32 mmol/L 25 24 21   Calcium 8.9 - 10.3 mg/dL 8.9 5.8(I) 9.1  Total Protein 6.5 - 8.1 g/dL 6.4(L) 6.4(L) 6.5  Total Bilirubin 0.3 - 1.2 mg/dL 0.4 <3.2(P) 0.3  Alkaline Phos 38 - 126 U/L 87 63 63  AST 15 - 41 U/L 11(L) 11(L) 14  ALT 0 - 44 U/L 9 8 8      RADIOGRAPHIC STUDIES: I have personally reviewed the radiological images as listed and agreed with the findings in the report. [place] MFM OB FOLLOW UP  Result Date: 08/20/2019 ----------------------------------------------------------------------  OBSTETRICS REPORT                       (Signed Final 08/20/2019 12:33 pm) ---------------------------------------------------------------------- Patient Info  ID #:       [id number]                          D.O.B.:  October 17, 1990 (28 yrs)  Name:       Haze Boyden  Visit Date: 08/20/2019 09:43 am ---------------------------------------------------------------------- Performed By  Performed By:     Sandi Mealy        Ref. Address:     [address]                    [address]                                                             [address]  Attending:        Ma Rings MD         [facility]  Referred By:      [facility] ---------------------------------------------------------------------- Orders   #  Description                          Code         Ordered By   1  [place] MFM OB FOLLOW UP                  [phone]     Rosana Hoes  ----------------------------------------------------------------------   #  Order #                    Accession #                 Episode #   1  [id number]                  [id number]                  [id number]  ---------------------------------------------------------------------- Indications   Obesity complicating pregnancy, third          O99.213   trimester   Poor obstetric history: Previous               O09.299   preeclampsia / eclampsia/gestational HTN   Poor obstetric history: Previous preterm       O09.219   delivery, antepartum ([redacted]w[redacted]d, abruption, c/s)   Encounter for antenatal screening for          Z36.3   malformations (low risk NIPS, 4.5FF)   Thrombocytopenia affecting pregnancy,          O99.119, D69.6   antepartum   [redacted] weeks gestation of pregnancy                Z3A.34  ---------------------------------------------------------------------- Fetal Evaluation  Num Of Fetuses:         1  Fetal Heart Rate(bpm):  143  Cardiac Activity:       Observed  Presentation:           Cephalic  Placenta:               Posterior  P. Cord Insertion:      Visualized  Amniotic Fluid  AFI FV:  Within normal limits  AFI Sum(cm)     %Tile       Largest Pocket(cm)  17.69           65          7.57  RUQ(cm)       RLQ(cm)       LUQ(cm)        LLQ(cm)  3.43          3.18          7.57           3.51 ---------------------------------------------------------------------- Biometry  BPD:        85  mm     G. Age:  34w 2d         51  %     CI:        79.98   %    70 - 86                                                          FL/HC:      23.3   %    19.4 - 21.8  HC:      300.3  mm     G. Age:  33w 2d          5  %    HC/AC:      0.98        0.96 - 1.11  AC:      306.4  mm     G. Age:  34w 4d         67  %    FL/BPD:     82.2   %    71 - 87  FL:       69.9  mm     G. Age:  35w 6d         83  %    FL/AC:      22.8   %    20 - 24  HUM:      58.2  mm     G. Age:  33w 5d         51  %  Est. FW:    2508  gm      5 lb 8 oz     63  % ---------------------------------------------------------------------- OB History  Gravidity:    6         Term:   2        Prem:   1        SAB:   1  TOP:          1       Ectopic:  0        Living: 2 ---------------------------------------------------------------------- Gestational Age  LMP:           35w 5d        Date:  12/13/18                 EDD:   09/19/19  U/S Today:     34w 4d  EDD:   09/27/19  Best:          34w 1d     Det. ByMarcella Dubs:  Early Ultrasound         EDD:   09/30/19                                      (02/20/19) ---------------------------------------------------------------------- Anatomy  Cranium:               Appears normal         LVOT:                   Previously seen  Cavum:                 Appears normal         Aortic Arch:            Previously seen  Ventricles:            Previously seen        Ductal Arch:            Previously seen  Choroid Plexus:        Previously seen        Diaphragm:              Appears normal  Cerebellum:            Previously seen        Stomach:                Appears normal, left                                                                        sided  Posterior Fossa:       Previously seen        Abdomen:                Appears normal  Nuchal Fold:           Not applicable (>20    Abdominal Wall:         Previously seen                         wks GA)  Face:                  Orbits and profile     Cord Vessels:           Previously  seen                         previously seen  Lips:                  Previously seen        Kidneys:                Previously seen  Palate:                Previously seen        Bladder:  Appears normal  Thoracic:              Appears normal         Spine:                  Previously seen  Heart:                 Previously seen        Upper Extremities:      Previously seen  RVOT:                  Previously seen        Lower Extremities:      Previously seen ---------------------------------------------------------------------- Comments  This patient was seen for a follow up growth scan due to  maternal obesity.  She denies any problems since her last  exam.  She was informed that the fetal growth and amniotic fluid  level appears appropriate for her gestational age.  Follow-up as indicated. ----------------------------------------------------------------------                   Ma Rings, MD Electronically Signed Final Report   08/20/2019 12:33 pm ----------------------------------------------------------------------   ASSESSMENT & PLAN:   29 yo with    1) Thrombocytopenia in 3rd trimester of pregnancy Cannot r/o ITP  PLAN: -Discussed pt labwork today, 08/21/19; PLT are stable at 98k, other blood counts are steady, blood chemistries are stable  -Discussed 08/20/2019 Immature Platelet Fraction at 17.3 -Advised pt that blood counts look relatively normal for a someone in the third trimester of pregnancy and her body is continuing to make PLTs as normal. No action needed at this time.  -Recommend pt continue Prenatal vitamins and Vitamin B12 throughout pregnancy and during breastfeeding  -Recommended pt continue to avoid OTC medications whenever possible  -Repeat CBC and Vitamin B12 levels with PCP 2-3 months after birth to document normalization of PLT counts  -Will see back as needed   FOLLOW UP:  RTC with Dr Candise Che as needed   The total time spent in the appt was 20 minutes  and more than 50% was on counseling and direct patient cares.  All of the patient's questions were answered with apparent satisfaction. The patient knows to call the clinic with any problems, questions or concerns.    Wyvonnia Lora MD MS AAHIVMS [facility] Hematology/Oncology Physician [facility]  (Office):       [phone] (Work cell):  [phone] (Fax):           [phone]  08/21/2019 3:07 PM  I, Carollee Herter, am acting as a scribe for Dr. Wyvonnia Lora.   .I have reviewed the above documentation for accuracy and completeness, and I agree with the above .[name] MD

## 2019-08-21 ENCOUNTER — Inpatient Hospital Stay: Payer: Medicaid Other | Attending: Hematology | Admitting: Hematology

## 2019-08-21 ENCOUNTER — Other Ambulatory Visit: Payer: Self-pay

## 2019-08-21 ENCOUNTER — Encounter: Payer: Self-pay | Admitting: *Deleted

## 2019-08-21 ENCOUNTER — Ambulatory Visit (INDEPENDENT_AMBULATORY_CARE_PROVIDER_SITE_OTHER): Payer: BLUE CROSS/BLUE SHIELD | Admitting: Obstetrics and Gynecology

## 2019-08-21 ENCOUNTER — Inpatient Hospital Stay: Payer: Medicaid Other

## 2019-08-21 VITALS — BP 122/77 | HR 98 | Wt 253.0 lb

## 2019-08-21 VITALS — BP 128/74 | HR 88 | Temp 98.9°F | Resp 18 | Ht 67.0 in | Wt 253.4 lb

## 2019-08-21 DIAGNOSIS — O99112 Other diseases of the blood and blood-forming organs and certain disorders involving the immune mechanism complicating pregnancy, second trimester: Secondary | ICD-10-CM

## 2019-08-21 DIAGNOSIS — D693 Immune thrombocytopenic purpura: Secondary | ICD-10-CM

## 2019-08-21 DIAGNOSIS — O09213 Supervision of pregnancy with history of pre-term labor, third trimester: Secondary | ICD-10-CM

## 2019-08-21 DIAGNOSIS — O0990 Supervision of high risk pregnancy, unspecified, unspecified trimester: Secondary | ICD-10-CM

## 2019-08-21 DIAGNOSIS — D696 Thrombocytopenia, unspecified: Secondary | ICD-10-CM | POA: Insufficient documentation

## 2019-08-21 DIAGNOSIS — K219 Gastro-esophageal reflux disease without esophagitis: Secondary | ICD-10-CM | POA: Insufficient documentation

## 2019-08-21 DIAGNOSIS — O99113 Other diseases of the blood and blood-forming organs and certain disorders involving the immune mechanism complicating pregnancy, third trimester: Secondary | ICD-10-CM | POA: Insufficient documentation

## 2019-08-21 DIAGNOSIS — Z3A34 34 weeks gestation of pregnancy: Secondary | ICD-10-CM

## 2019-08-21 DIAGNOSIS — Z98891 History of uterine scar from previous surgery: Secondary | ICD-10-CM

## 2019-08-21 DIAGNOSIS — Z8751 Personal history of pre-term labor: Secondary | ICD-10-CM

## 2019-08-21 DIAGNOSIS — O34219 Maternal care for unspecified type scar from previous cesarean delivery: Secondary | ICD-10-CM

## 2019-08-21 LAB — CBC WITH DIFFERENTIAL/PLATELET
Abs Immature Granulocytes: 0.03 10*3/uL (ref 0.00–0.07)
Basophils Absolute: 0 10*3/uL (ref 0.0–0.1)
Basophils Relative: 0 %
Eosinophils Absolute: 0.1 10*3/uL (ref 0.0–0.5)
Eosinophils Relative: 1 %
HCT: 32.2 % — ABNORMAL LOW (ref 36.0–46.0)
Hemoglobin: 10.5 g/dL — ABNORMAL LOW (ref 12.0–15.0)
Immature Granulocytes: 0 %
Lymphocytes Relative: 15 %
Lymphs Abs: 1.1 10*3/uL (ref 0.7–4.0)
MCH: 30.7 pg (ref 26.0–34.0)
MCHC: 32.6 g/dL (ref 30.0–36.0)
MCV: 94.2 fL (ref 80.0–100.0)
Monocytes Absolute: 0.5 10*3/uL (ref 0.1–1.0)
Monocytes Relative: 7 %
Neutro Abs: 5.2 10*3/uL (ref 1.7–7.7)
Neutrophils Relative %: 77 %
Platelets: 98 10*3/uL — ABNORMAL LOW (ref 150–400)
RBC: 3.42 MIL/uL — ABNORMAL LOW (ref 3.87–5.11)
RDW: 13.4 % (ref 11.5–15.5)
WBC: 6.9 10*3/uL (ref 4.0–10.5)
nRBC: 0 % (ref 0.0–0.2)

## 2019-08-21 LAB — CMP (CANCER CENTER ONLY)
ALT: 9 U/L (ref 0–44)
AST: 11 U/L — ABNORMAL LOW (ref 15–41)
Albumin: 2.8 g/dL — ABNORMAL LOW (ref 3.5–5.0)
Alkaline Phosphatase: 87 U/L (ref 38–126)
Anion gap: 5 (ref 5–15)
BUN: 8 mg/dL (ref 6–20)
CO2: 25 mmol/L (ref 22–32)
Calcium: 8.9 mg/dL (ref 8.9–10.3)
Chloride: 107 mmol/L (ref 98–111)
Creatinine: 0.69 mg/dL (ref 0.44–1.00)
GFR, Est AFR Am: >60 mL/min (ref >60)
GFR, Estimated: >60 mL/min (ref >60)
Glucose, Bld: 119 mg/dL — ABNORMAL HIGH (ref 70–99)
Potassium: 4.4 mmol/L (ref 3.5–5.1)
Sodium: 137 mmol/L (ref 135–145)
Total Bilirubin: 0.4 mg/dL (ref 0.3–1.2)
Total Protein: 6.4 g/dL — ABNORMAL LOW (ref 6.5–8.1)

## 2019-08-21 LAB — PLATELET BY CITRATE

## 2019-08-21 LAB — IMMATURE PLATELET FRACTION: Immature Platelet Fraction: 17.3 % — ABNORMAL HIGH (ref 1.2–8.6)

## 2019-08-21 NOTE — Progress Notes (Signed)
Prenatal Visit Note Date: 08/21/2019 Clinic: [facility]  Subjective:  Michele Hayden is a 29 y.o. H3Z1696 at [redacted]w[redacted]d being seen today for ongoing prenatal care.  She is currently monitored for the following issues for this high-risk pregnancy and has Obesity in pregnancy; History of placenta abruption; Eczema; Thrombocytopenia (HCC); History of VBAC; History of gestational hypertension; History of preterm delivery; BMI 30s; and Supervision of high risk pregnancy, antepartum on their problem list.  Patient reports no complaints.   Contractions: Not present. Vag. Bleeding: None.  Movement: Present. Denies leaking of fluid.   The following portions of the patient's history were reviewed and updated as appropriate: allergies, current medications, past family history, past medical history, past social history, past surgical history and problem list. Problem list updated.  Objective:   Vitals:   08/21/19 1332  BP: 122/77  Pulse: 98  Weight: 253 lb (114.8 kg)    Fetal Status: Fetal Heart Rate (bpm): 150    Movement: Present     General:  Alert, oriented and cooperative. Patient is in no acute distress.  Skin: Skin is warm and dry. No rash noted.   Cardiovascular: Normal heart rate noted  Respiratory: Normal respiratory effort, no problems with respiration noted  Abdomen: Soft, gravid, appropriate for gestational age. Pain/Pressure: Absent     Pelvic:  Cervical exam deferred        Extremities: Normal range of motion.  Edema: None  Mental Status: Normal mood and affect. Normal behavior. Normal judgment and thought content.   Urinalysis:      Assessment and Plan:  Pregnancy: [id number] at [redacted]w[redacted]d  1. Thrombocytopenia (HCC) Seen by heme today. F/u recs from note but patient said nothing to do. I sent an inbasket message to Dr. Malen Gauze to get a consensus with the anesthesia dept for regional. Pt confirms she never started the ASA. Recommend 2wk RTC for gbs and cbc and  recommend qwk cbc and iol at 39wk which she is amenable to.  CBC Latest Ref Rng & Units 08/21/2019 06/27/2019 04/24/2019  WBC 4.0 - 10.5 K/uL 6.9 6.6 7.7  Hemoglobin 12.0 - 15.0 g/dL 10.5(L) 11.0(L) 11.8(L)  Hematocrit 36.0 - 46.0 % 32.2(L) 32.1(L) 36.2  Platelets 150 - 400 K/uL 98(L) 94(LL) 114(L)   2. History of VBAC Form signed today  3. History of preterm delivery Stopped taking the vag progesterone about a month ago  4. Supervision of high risk pregnancy, antepartum See above  Preterm labor symptoms and general obstetric precautions including but not limited to vaginal bleeding, contractions, leaking of fluid and fetal movement were reviewed in detail with the patient. Please refer to After Visit Summary for other counseling recommendations.  Return in about 2 weeks (around 09/04/2019) for high risk, in person.   [place] Bing, MD

## 2019-08-27 ENCOUNTER — Encounter: Payer: Self-pay | Admitting: Obstetrics and Gynecology

## 2019-08-27 DIAGNOSIS — D696 Thrombocytopenia, unspecified: Secondary | ICD-10-CM

## 2019-08-27 NOTE — Progress Notes (Signed)
For Red Chart Rounds RE: platelet level for regional Received: 3 days ago Message Contents  Mal Amabile, MD  [place] Bing, MD  Merrissa Giacobbe,   Stable platelet count 90-100k, virtually all of [place] would do epidural. Most now would do down to 80k. Some possibly lower. When asked a few years ago a "number" we told everyone 100k, below that it was at the anesthesiologist's discretion. Stable is the key in this patient.       Previous Messages   ----- Message -----  From: [place] Bing, MD  Sent: 08/21/2019  2:42 PM EST  To: Mal Amabile, MD  Subject: platelet level for regional            Her platelets have been stable in the 90s-100s. I just wanted to get a consensus with your team as to what cut off y'all have for doing regional in her. Thanks!    Cornelia Copa MD Attending [facility] [employer] (Faculty Practice) 08/27/2019 Time: [phone]

## 2019-09-03 ENCOUNTER — Ambulatory Visit (INDEPENDENT_AMBULATORY_CARE_PROVIDER_SITE_OTHER): Payer: Medicaid Other | Admitting: Obstetrics and Gynecology

## 2019-09-03 ENCOUNTER — Other Ambulatory Visit: Payer: Self-pay

## 2019-09-03 VITALS — BP 116/73 | HR 90 | Wt 259.0 lb

## 2019-09-03 DIAGNOSIS — O0990 Supervision of high risk pregnancy, unspecified, unspecified trimester: Secondary | ICD-10-CM

## 2019-09-03 DIAGNOSIS — O99013 Anemia complicating pregnancy, third trimester: Secondary | ICD-10-CM

## 2019-09-03 DIAGNOSIS — O09293 Supervision of pregnancy with other poor reproductive or obstetric history, third trimester: Secondary | ICD-10-CM

## 2019-09-03 DIAGNOSIS — Z8759 Personal history of other complications of pregnancy, childbirth and the puerperium: Secondary | ICD-10-CM

## 2019-09-03 DIAGNOSIS — D696 Thrombocytopenia, unspecified: Secondary | ICD-10-CM

## 2019-09-03 DIAGNOSIS — Z98891 History of uterine scar from previous surgery: Secondary | ICD-10-CM

## 2019-09-03 DIAGNOSIS — O34219 Maternal care for unspecified type scar from previous cesarean delivery: Secondary | ICD-10-CM

## 2019-09-04 LAB — CBC
Hematocrit: 33.1 % — ABNORMAL LOW (ref 34.0–46.6)
Hemoglobin: 10.9 g/dL — ABNORMAL LOW (ref 11.1–15.9)
MCH: 30.2 pg (ref 26.6–33.0)
MCHC: 32.9 g/dL (ref 31.5–35.7)
MCV: 92 fL (ref 79–97)
Platelets: 106 10*3/uL — ABNORMAL LOW (ref 150–450)
RBC: 3.61 x10E6/uL — ABNORMAL LOW (ref 3.77–5.28)
RDW: 13.1 % (ref 11.7–15.4)
WBC: 8.3 10*3/uL (ref 3.4–10.8)

## 2019-09-04 NOTE — Progress Notes (Signed)
Prenatal Visit Note Date: 09/03/2019 Clinic: [facility]  Subjective:  Michele Hayden is a 29 y.o. [phone] at [redacted]w[redacted]d being seen today for ongoing prenatal care.  She is currently monitored for the following issues for this high-risk pregnancy and has Obesity in pregnancy; History of placenta abruption; Eczema; Thrombocytopenia (HCC); History of VBAC; History of gestational hypertension; History of preterm delivery; BMI 30s; and Supervision of high risk pregnancy, antepartum on their problem list.  Patient reports no complaints.   Contractions: Not present. Vag. Bleeding: None.  Movement: Present. Denies leaking of fluid.   The following portions of the patient's history were reviewed and updated as appropriate: allergies, current medications, past family history, past medical history, past social history, past surgical history and problem list. Problem list updated.  Objective:   Vitals:   09/03/19 1553  BP: 116/73  Pulse: 90  Weight: 259 lb (117.5 kg)    Fetal Status: Fetal Heart Rate (bpm): 140   Movement: Present     General:  Alert, oriented and cooperative. Patient is in no acute distress.  Skin: Skin is warm and dry. No rash noted.   Cardiovascular: Normal heart rate noted  Respiratory: Normal respiratory effort, no problems with respiration noted  Abdomen: Soft, gravid, appropriate for gestational age. Pain/Pressure: Absent     Extremities: Normal range of motion.  Edema: None  Mental Status: Normal mood and affect. Normal behavior. Normal judgment and thought content.   Urinalysis:      Assessment and Plan:  Pregnancy: [id number] at [redacted]w[redacted]d  1. Supervision of high risk pregnancy, antepartum Routine care - Culture, beta strep (group b only) - GC/Chlamydia probe amp ([place])not at [facility] - CBC - [place] MFM OB FOLLOW UP; Future  2. Thrombocytopenia (HCC) Follow up pantalets. Recommend qwk checks and IOL at 39wks which she is ameanble to. Surveillance  growth u/s ordered - CBC - [place] MFM OB FOLLOW UP; Future  3. History of VBAC  Preterm labor symptoms and general obstetric precautions including but not limited to vaginal bleeding, contractions, leaking of fluid and fetal movement were reviewed in detail with the patient. Please refer to After Visit Summary for other counseling recommendations.  No follow-ups on file. RTC 7-10d   Bing, MD

## 2019-09-05 LAB — GC/CHLAMYDIA PROBE AMP (~~LOC~~) NOT AT ARMC
Chlamydia: NEGATIVE
Comment: NEGATIVE
Comment: NORMAL
Neisseria Gonorrhea: NEGATIVE

## 2019-09-07 LAB — CULTURE, BETA STREP (GROUP B ONLY): Strep Gp B Culture: NEGATIVE

## 2019-09-12 ENCOUNTER — Encounter (HOSPITAL_COMMUNITY): Payer: Self-pay

## 2019-09-13 ENCOUNTER — Other Ambulatory Visit: Payer: Self-pay

## 2019-09-13 ENCOUNTER — Ambulatory Visit (INDEPENDENT_AMBULATORY_CARE_PROVIDER_SITE_OTHER): Payer: Medicaid Other | Admitting: Obstetrics and Gynecology

## 2019-09-13 VITALS — BP 128/84 | HR 74 | Wt 261.2 lb

## 2019-09-13 DIAGNOSIS — D696 Thrombocytopenia, unspecified: Secondary | ICD-10-CM

## 2019-09-13 DIAGNOSIS — O34219 Maternal care for unspecified type scar from previous cesarean delivery: Secondary | ICD-10-CM

## 2019-09-13 DIAGNOSIS — Z98891 History of uterine scar from previous surgery: Secondary | ICD-10-CM

## 2019-09-13 DIAGNOSIS — O0990 Supervision of high risk pregnancy, unspecified, unspecified trimester: Secondary | ICD-10-CM

## 2019-09-13 DIAGNOSIS — Z3A37 37 weeks gestation of pregnancy: Secondary | ICD-10-CM

## 2019-09-13 DIAGNOSIS — O99113 Other diseases of the blood and blood-forming organs and certain disorders involving the immune mechanism complicating pregnancy, third trimester: Secondary | ICD-10-CM

## 2019-09-13 MED ORDER — MISOPROSTOL 25 MCG QUARTER TABLET: 25.0000 ug | ORAL_TABLET | ORAL | Status: DC | PRN

## 2019-09-13 NOTE — Progress Notes (Signed)
Prenatal Visit Note Date: 09/13/2019 Clinic: [facility]  Subjective:  Michele Hayden is a 29 y.o. [phone] at [redacted]w[redacted]d being seen today for ongoing prenatal care.  She is currently monitored for the following issues for this high-risk pregnancy and has Obesity in pregnancy; History of placenta abruption; Eczema; Thrombocytopenia (HCC); History of VBAC; History of gestational hypertension; History of preterm delivery; BMI 30s; and Supervision of high risk pregnancy, antepartum on their problem list.  Patient reports no complaints.   Contractions: Not present.  .  Movement: Present. Denies leaking of fluid. No VB  The following portions of the patient's history were reviewed and updated as appropriate: allergies, current medications, past family history, past medical history, past social history, past surgical history and problem list. Problem list updated.  Objective:   Vitals:   09/13/19 1034  BP: 128/84  Pulse: 74  Weight: 261 lb 3.2 oz (118.5 kg)    Fetal Status: Fetal Heart Rate (bpm): 140   Movement: Present  Presentation: Vertex  General:  Alert, oriented and cooperative. Patient is in no acute distress.  Skin: Skin is warm and dry. No rash noted.   Cardiovascular: Normal heart rate noted  Respiratory: Normal respiratory effort, no problems with respiration noted  Abdomen: Soft, gravid, appropriate for gestational age. Pain/Pressure: Absent     Pelvic:  Cervical exam performed Dilation: 1 Effacement (%): Thick    Extremities: Normal range of motion.  Edema: None  Mental Status: Normal mood and affect. Normal behavior. Normal judgment and thought content.   Urinalysis:      Assessment and Plan:  Pregnancy: [id number] at [redacted]w[redacted]d  1. Thrombocytopenia (HCC) Repeat today. Will set up for 39wk iol. Growth u/s next week - CBC  CBC Latest Ref Rng & Units 09/03/2019 08/21/2019 06/27/2019  WBC 3.4 - 10.8 x10E3/uL 8.3 6.9 6.6  Hemoglobin 11.1 - 15.9 g/dL 10.9(L)  10.5(L) 11.0(L)  Hematocrit 34.0 - 46.6 % 33.1(L) 32.2(L) 32.1(L)  Platelets 150 - 450 x10E3/uL 106(L) 98(L) 94(LL)     2. History of VBAC tolac consent already signed  3. Supervision of high risk pregnancy, antepartum Routine care Not taking progesterone anymore  Term labor symptoms and general obstetric precautions including but not limited to vaginal bleeding, contractions, leaking of fluid and fetal movement were reviewed in detail with the patient. Please refer to After Visit Summary for other counseling recommendations.  Return in about 1 week (around 09/20/2019) for high risk, in person.   [place] Bing, MD

## 2019-09-14 ENCOUNTER — Telehealth (HOSPITAL_COMMUNITY): Payer: Self-pay | Admitting: *Deleted

## 2019-09-14 ENCOUNTER — Encounter (HOSPITAL_COMMUNITY): Payer: Self-pay | Admitting: *Deleted

## 2019-09-14 LAB — CBC
Hematocrit: 33.5 % — ABNORMAL LOW (ref 34.0–46.6)
Hemoglobin: 11.3 g/dL (ref 11.1–15.9)
MCH: 30.5 pg (ref 26.6–33.0)
MCHC: 33.7 g/dL (ref 31.5–35.7)
MCV: 91 fL (ref 79–97)
Platelets: 112 10*3/uL — ABNORMAL LOW (ref 150–450)
RBC: 3.7 x10E6/uL — ABNORMAL LOW (ref 3.77–5.28)
RDW: 13.4 % (ref 11.7–15.4)
WBC: 7.5 10*3/uL (ref 3.4–10.8)

## 2019-09-14 NOTE — Telephone Encounter (Signed)
Preadmission screen  

## 2019-09-17 ENCOUNTER — Ambulatory Visit (HOSPITAL_COMMUNITY): Payer: Medicaid Other | Admitting: *Deleted

## 2019-09-17 ENCOUNTER — Other Ambulatory Visit: Payer: Self-pay

## 2019-09-17 ENCOUNTER — Ambulatory Visit (HOSPITAL_COMMUNITY)
Admission: RE | Admit: 2019-09-17 | Discharge: 2019-09-17 | Disposition: A | Discharge status: Home / Self Care | Payer: Medicaid Other | Source: Ambulatory Visit | Attending: Obstetrics and Gynecology | Admitting: Obstetrics and Gynecology

## 2019-09-17 ENCOUNTER — Encounter: Payer: Self-pay | Admitting: Family Medicine

## 2019-09-17 ENCOUNTER — Ambulatory Visit (INDEPENDENT_AMBULATORY_CARE_PROVIDER_SITE_OTHER): Payer: Medicaid Other | Admitting: Family Medicine

## 2019-09-17 ENCOUNTER — Encounter (HOSPITAL_COMMUNITY): Payer: Self-pay

## 2019-09-17 VITALS — BP 129/79 | HR 90 | Wt 266.0 lb

## 2019-09-17 DIAGNOSIS — D696 Thrombocytopenia, unspecified: Secondary | ICD-10-CM | POA: Insufficient documentation

## 2019-09-17 DIAGNOSIS — Z3A38 38 weeks gestation of pregnancy: Secondary | ICD-10-CM

## 2019-09-17 DIAGNOSIS — O09213 Supervision of pregnancy with history of pre-term labor, third trimester: Secondary | ICD-10-CM

## 2019-09-17 DIAGNOSIS — O0990 Supervision of high risk pregnancy, unspecified, unspecified trimester: Secondary | ICD-10-CM

## 2019-09-17 DIAGNOSIS — O99213 Obesity complicating pregnancy, third trimester: Secondary | ICD-10-CM | POA: Diagnosis not present

## 2019-09-17 DIAGNOSIS — O09219 Supervision of pregnancy with history of pre-term labor, unspecified trimester: Secondary | ICD-10-CM

## 2019-09-17 DIAGNOSIS — O99113 Other diseases of the blood and blood-forming organs and certain disorders involving the immune mechanism complicating pregnancy, third trimester: Secondary | ICD-10-CM

## 2019-09-17 DIAGNOSIS — Z98891 History of uterine scar from previous surgery: Secondary | ICD-10-CM

## 2019-09-17 DIAGNOSIS — O09293 Supervision of pregnancy with other poor reproductive or obstetric history, third trimester: Secondary | ICD-10-CM

## 2019-09-17 DIAGNOSIS — O34219 Maternal care for unspecified type scar from previous cesarean delivery: Secondary | ICD-10-CM

## 2019-09-17 DIAGNOSIS — O2600 Excessive weight gain in pregnancy, unspecified trimester: Secondary | ICD-10-CM

## 2019-09-17 DIAGNOSIS — O99210 Obesity complicating pregnancy, unspecified trimester: Secondary | ICD-10-CM

## 2019-09-17 DIAGNOSIS — Z8751 Personal history of pre-term labor: Secondary | ICD-10-CM

## 2019-09-17 DIAGNOSIS — Z362 Encounter for other antenatal screening follow-up: Secondary | ICD-10-CM

## 2019-09-17 DIAGNOSIS — Z8759 Personal history of other complications of pregnancy, childbirth and the puerperium: Secondary | ICD-10-CM

## 2019-09-17 NOTE — Progress Notes (Signed)
   PRENATAL VISIT NOTE  Subjective:  Michele Hayden is a 29 y.o. [id number] at [redacted]w[redacted]d being seen today for ongoing prenatal care.  She is currently monitored for the following issues for this high-risk pregnancy and has Obesity in pregnancy; History of placenta abruption; Eczema; Thrombocytopenia (HCC); History of VBAC; History of gestational hypertension; History of preterm delivery; BMI 30s; Supervision of high risk pregnancy, antepartum; and Excessive weight gain affecting pregnancy on their problem list.  Patient reports no complaints.  Contractions: Not present. Vag. Bleeding: None.  Movement: Present. Denies leaking of fluid.   The following portions of the patient's history were reviewed and updated as appropriate: allergies, current medications, past family history, past medical history, past social history, past surgical history and problem list.   Objective:   Vitals:   09/17/19 1314  BP: 129/79  Pulse: 90  Weight: 266 lb (120.7 kg)    Fetal Status: Fetal Heart Rate (bpm): 148   Movement: Present  Presentation: Vertex  General:  Alert, oriented and cooperative. Patient is in no acute distress.  Skin: Skin is warm and dry. No rash noted.   Cardiovascular: Normal heart rate noted  Respiratory: Normal respiratory effort, no problems with respiration noted  Abdomen: Soft, gravid, appropriate for gestational age.  Pain/Pressure: Absent     Pelvic: Cervical exam deferred Dilation: 1 Effacement (%): Thick Station: Ballotable  Extremities: Normal range of motion.  Edema: Mild pitting, slight indentation  Mental Status: Normal mood and affect. Normal behavior. Normal judgment and thought content.   Assessment and Plan:  Pregnancy: [id number] at [redacted]w[redacted]d  1. Supervision of high risk pregnancy, antepartum Up to date Discussed contraception- not interested in BTS. Thinking about LARC options Has IOL on 3/30  2. History of VBAC Desires TOLAC  3. History of gestational hypertension BP  WNL  4. History of preterm delivery Now term!!  5. Obesity in pregnancy- excessive weight gain in pregnancy TWG=63 lb (28.6 kg)   6. Thrombocytopenia - stable on last check, plan for check on LD  Term labor symptoms and general obstetric precautions including but not limited to vaginal bleeding, contractions, leaking of fluid and fetal movement were reviewed in detail with the patient. Please refer to After Visit Summary for other counseling recommendations.   Return in about 7 weeks (around 11/05/2019) for Postpartum visit.  Future Appointments  Date Time Provider Department Center  09/24/2019  9:10 AM [facility]-SCREENING [facility] None  09/25/2019  7:00 AM [facility]-LD SCHED ROOM [facility] None    Federico Flake, MD

## 2019-09-19 ENCOUNTER — Other Ambulatory Visit: Payer: Self-pay | Admitting: Family Medicine

## 2019-09-19 ENCOUNTER — Telehealth (HOSPITAL_COMMUNITY): Payer: Self-pay | Admitting: *Deleted

## 2019-09-19 NOTE — Telephone Encounter (Signed)
Preadmission screen  

## 2019-09-24 ENCOUNTER — Inpatient Hospital Stay (HOSPITAL_COMMUNITY): Payer: Medicaid Other | Admitting: Anesthesiology

## 2019-09-24 ENCOUNTER — Encounter (HOSPITAL_COMMUNITY): Payer: Self-pay | Admitting: Obstetrics & Gynecology

## 2019-09-24 ENCOUNTER — Other Ambulatory Visit (HOSPITAL_COMMUNITY): Payer: Self-pay | Attending: Family Medicine

## 2019-09-24 ENCOUNTER — Other Ambulatory Visit: Payer: Self-pay

## 2019-09-24 ENCOUNTER — Inpatient Hospital Stay (HOSPITAL_COMMUNITY)
Admission: AD | Admit: 2019-09-24 | Discharge: 2019-09-26 | DRG: 807 | Disposition: A | Discharge status: Home / Self Care | Payer: Medicaid Other | Attending: Obstetrics and Gynecology | Admitting: Obstetrics and Gynecology

## 2019-09-24 DIAGNOSIS — O99119 Other diseases of the blood and blood-forming organs and certain disorders involving the immune mechanism complicating pregnancy, unspecified trimester: Secondary | ICD-10-CM | POA: Diagnosis present

## 2019-09-24 DIAGNOSIS — D696 Thrombocytopenia, unspecified: Secondary | ICD-10-CM | POA: Diagnosis present

## 2019-09-24 DIAGNOSIS — O34219 Maternal care for unspecified type scar from previous cesarean delivery: Secondary | ICD-10-CM | POA: Diagnosis present

## 2019-09-24 DIAGNOSIS — Z20822 Contact with and (suspected) exposure to covid-19: Secondary | ICD-10-CM | POA: Diagnosis present

## 2019-09-24 DIAGNOSIS — Z3A39 39 weeks gestation of pregnancy: Secondary | ICD-10-CM

## 2019-09-24 DIAGNOSIS — D6959 Other secondary thrombocytopenia: Secondary | ICD-10-CM | POA: Diagnosis present

## 2019-09-24 DIAGNOSIS — Z87891 Personal history of nicotine dependence: Secondary | ICD-10-CM

## 2019-09-24 DIAGNOSIS — R030 Elevated blood-pressure reading, without diagnosis of hypertension: Secondary | ICD-10-CM | POA: Diagnosis present

## 2019-09-24 DIAGNOSIS — O139 Gestational [pregnancy-induced] hypertension without significant proteinuria, unspecified trimester: Secondary | ICD-10-CM

## 2019-09-24 DIAGNOSIS — O99210 Obesity complicating pregnancy, unspecified trimester: Secondary | ICD-10-CM | POA: Diagnosis present

## 2019-09-24 DIAGNOSIS — O99214 Obesity complicating childbirth: Secondary | ICD-10-CM | POA: Diagnosis present

## 2019-09-24 DIAGNOSIS — O26893 Other specified pregnancy related conditions, third trimester: Secondary | ICD-10-CM | POA: Diagnosis present

## 2019-09-24 DIAGNOSIS — O2603 Excessive weight gain in pregnancy, third trimester: Secondary | ICD-10-CM | POA: Diagnosis present

## 2019-09-24 DIAGNOSIS — O9912 Other diseases of the blood and blood-forming organs and certain disorders involving the immune mechanism complicating childbirth: Secondary | ICD-10-CM | POA: Diagnosis present

## 2019-09-24 DIAGNOSIS — O0990 Supervision of high risk pregnancy, unspecified, unspecified trimester: Secondary | ICD-10-CM

## 2019-09-24 DIAGNOSIS — Z8759 Personal history of other complications of pregnancy, childbirth and the puerperium: Secondary | ICD-10-CM

## 2019-09-24 DIAGNOSIS — Z98891 History of uterine scar from previous surgery: Secondary | ICD-10-CM

## 2019-09-24 DIAGNOSIS — Z8751 Personal history of pre-term labor: Secondary | ICD-10-CM

## 2019-09-24 DIAGNOSIS — O2600 Excessive weight gain in pregnancy, unspecified trimester: Secondary | ICD-10-CM

## 2019-09-24 HISTORY — DX: Gestational (pregnancy-induced) hypertension without significant proteinuria, unspecified trimester: O13.9

## 2019-09-24 LAB — COMPREHENSIVE METABOLIC PANEL
ALT: 14 U/L (ref 0–44)
AST: 16 U/L (ref 15–41)
Albumin: 2.7 g/dL — ABNORMAL LOW (ref 3.5–5.0)
Alkaline Phosphatase: 98 U/L (ref 38–126)
Anion gap: 11 (ref 5–15)
BUN: 7 mg/dL (ref 6–20)
CO2: 22 mmol/L (ref 22–32)
Calcium: 9.2 mg/dL (ref 8.9–10.3)
Chloride: 104 mmol/L (ref 98–111)
Creatinine, Ser: 0.65 mg/dL (ref 0.44–1.00)
GFR calc Af Amer: >60 mL/min (ref >60)
GFR calc non Af Amer: >60 mL/min (ref >60)
Glucose, Bld: 111 mg/dL — ABNORMAL HIGH (ref 70–99)
Potassium: 4.3 mmol/L (ref 3.5–5.1)
Sodium: 137 mmol/L (ref 135–145)
Total Bilirubin: 0.5 mg/dL (ref 0.3–1.2)
Total Protein: 5.7 g/dL — ABNORMAL LOW (ref 6.5–8.1)

## 2019-09-24 LAB — CBC
HCT: 35.2 % — ABNORMAL LOW (ref 36.0–46.0)
HCT: 35.2 % — ABNORMAL LOW (ref 36.0–46.0)
Hemoglobin: 11.5 g/dL — ABNORMAL LOW (ref 12.0–15.0)
Hemoglobin: 11.5 g/dL — ABNORMAL LOW (ref 12.0–15.0)
MCH: 29.9 pg (ref 26.0–34.0)
MCH: 30.1 pg (ref 26.0–34.0)
MCHC: 32.7 g/dL (ref 30.0–36.0)
MCHC: 32.7 g/dL (ref 30.0–36.0)
MCV: 91.4 fL (ref 80.0–100.0)
MCV: 92.1 fL (ref 80.0–100.0)
Platelets: 107 10*3/uL — ABNORMAL LOW (ref 150–400)
Platelets: 114 10*3/uL — ABNORMAL LOW (ref 150–400)
RBC: 3.85 MIL/uL — ABNORMAL LOW (ref 3.87–5.11)
RBC: 3.82 MIL/uL — ABNORMAL LOW (ref 3.87–5.11)
RDW: 13.6 % (ref 11.5–15.5)
RDW: 13.7 % (ref 11.5–15.5)
WBC: 8.5 10*3/uL (ref 4.0–10.5)
WBC: 10.6 10*3/uL — ABNORMAL HIGH (ref 4.0–10.5)
nRBC: 0 % (ref 0.0–0.2)
nRBC: 0 % (ref 0.0–0.2)

## 2019-09-24 LAB — ABO/RH: ABO/RH(D): O POS

## 2019-09-24 LAB — TYPE AND SCREEN
ABO/RH(D): O POS
Antibody Screen: NEGATIVE

## 2019-09-24 LAB — PROTEIN / CREATININE RATIO, URINE
Creatinine, Urine: 107.2 mg/dL
Protein Creatinine Ratio: 0.06 mg/mg{Cre} (ref 0.00–0.15)
Total Protein, Urine: 6 mg/dL

## 2019-09-24 LAB — RESPIRATORY PANEL BY RT PCR (FLU A&B, COVID)
Influenza A by PCR: NEGATIVE
Influenza B by PCR: NEGATIVE
SARS Coronavirus 2 by RT PCR: NEGATIVE

## 2019-09-24 LAB — RPR: RPR Ser Ql: NONREACTIVE

## 2019-09-24 MED ORDER — ACETAMINOPHEN 325 MG PO TABS: 650.0000 mg | ORAL_TABLET | ORAL | Status: DC | PRN

## 2019-09-24 MED ORDER — PHENYLEPHRINE 40 MCG/ML (10ML) SYRINGE FOR IV PUSH (FOR BLOOD PRESSURE SUPPORT): 80.0000 ug | PREFILLED_SYRINGE | INTRAVENOUS | Status: DC | PRN

## 2019-09-24 MED ORDER — DIPHENHYDRAMINE HCL 50 MG/ML IJ SOLN: 12.5000 mg | INTRAMUSCULAR | Status: DC | PRN

## 2019-09-24 MED ORDER — ONDANSETRON HCL 4 MG/2ML IJ SOLN: 4.0000 mg | INTRAMUSCULAR | Status: DC | PRN

## 2019-09-24 MED ORDER — MEASLES, MUMPS & RUBELLA VAC IJ SOLR: 0.5000 mL | Freq: Once | INTRAMUSCULAR | Status: DC

## 2019-09-24 MED ORDER — SOD CITRATE-CITRIC ACID 500-334 MG/5ML PO SOLN: 30.0000 mL | ORAL | Status: DC | PRN

## 2019-09-24 MED ORDER — ONDANSETRON HCL 4 MG/2ML IJ SOLN: 4.0000 mg | Freq: Four times a day (QID) | INTRAMUSCULAR | Status: DC | PRN

## 2019-09-24 MED ORDER — SODIUM CHLORIDE (PF) 0.9 % IJ SOLN
INTRAMUSCULAR | Status: DC | PRN
  Administered 2019-09-24: 12 mL/h via EPIDURAL

## 2019-09-24 MED ORDER — ACETAMINOPHEN 325 MG PO TABS
650.0000 mg | ORAL_TABLET | Freq: Four times a day (QID) | ORAL | Status: DC | PRN
  Administered 2019-09-24 – 2019-09-26 (×5): 650 mg via ORAL
  Filled 2019-09-24 (×5): qty 2

## 2019-09-24 MED ORDER — DIPHENHYDRAMINE HCL 25 MG PO CAPS: 25.0000 mg | ORAL_CAPSULE | Freq: Four times a day (QID) | ORAL | Status: DC | PRN

## 2019-09-24 MED ORDER — LACTATED RINGERS IV SOLN: INTRAVENOUS | Status: DC

## 2019-09-24 MED ORDER — LACTATED RINGERS IV SOLN: 500.0000 mL | Freq: Once | INTRAVENOUS | Status: DC

## 2019-09-24 MED ORDER — SENNOSIDES-DOCUSATE SODIUM 8.6-50 MG PO TABS
2.0000 | ORAL_TABLET | ORAL | Status: DC
  Administered 2019-09-24 – 2019-09-25 (×2): 2 via ORAL
  Filled 2019-09-24 (×2): qty 2

## 2019-09-24 MED ORDER — TETANUS-DIPHTH-ACELL PERTUSSIS 5-2.5-18.5 LF-MCG/0.5 IM SUSP: 0.5000 mL | Freq: Once | INTRAMUSCULAR | Status: DC

## 2019-09-24 MED ORDER — EPHEDRINE 5 MG/ML INJ: 10.0000 mg | INTRAVENOUS | Status: DC | PRN

## 2019-09-24 MED ORDER — FENTANYL-BUPIVACAINE-NACL 0.5-0.125-0.9 MG/250ML-% EP SOLN
12.0000 mL/h | EPIDURAL | Status: DC | PRN
  Filled 2019-09-24: qty 250

## 2019-09-24 MED ORDER — LIDOCAINE HCL (PF) 1 % IJ SOLN
INTRAMUSCULAR | Status: DC | PRN
  Administered 2019-09-24: 5 mL via EPIDURAL
  Administered 2019-09-24: 2 mL via EPIDURAL
  Administered 2019-09-24: 3 mL via EPIDURAL

## 2019-09-24 MED ORDER — FENTANYL CITRATE (PF) 100 MCG/2ML IJ SOLN: 50.0000 ug | INTRAMUSCULAR | Status: DC | PRN

## 2019-09-24 MED ORDER — OXYTOCIN 40 UNITS IN NORMAL SALINE INFUSION - SIMPLE MED
2.5000 [IU]/h | INTRAVENOUS | Status: DC
  Filled 2019-09-24: qty 1000

## 2019-09-24 MED ORDER — DIBUCAINE (PERIANAL) 1 % EX OINT: 1.0000 "application " | TOPICAL_OINTMENT | CUTANEOUS | Status: DC | PRN

## 2019-09-24 MED ORDER — BENZOCAINE-MENTHOL 20-0.5 % EX AERO
1.0000 "application " | INHALATION_SPRAY | CUTANEOUS | Status: DC | PRN
  Filled 2019-09-24: qty 56

## 2019-09-24 MED ORDER — COCONUT OIL OIL: 1.0000 "application " | TOPICAL_OIL | Status: DC | PRN

## 2019-09-24 MED ORDER — LIDOCAINE HCL (PF) 1 % IJ SOLN: 30.0000 mL | INTRAMUSCULAR | Status: DC | PRN

## 2019-09-24 MED ORDER — IBUPROFEN 600 MG PO TABS
600.0000 mg | ORAL_TABLET | Freq: Three times a day (TID) | ORAL | Status: DC | PRN
  Administered 2019-09-24 – 2019-09-26 (×5): 600 mg via ORAL
  Filled 2019-09-24 (×5): qty 1

## 2019-09-24 MED ORDER — WITCH HAZEL-GLYCERIN EX PADS: 1.0000 "application " | MEDICATED_PAD | CUTANEOUS | Status: DC | PRN

## 2019-09-24 MED ORDER — FENTANYL CITRATE (PF) 100 MCG/2ML IJ SOLN
100.0000 ug | INTRAMUSCULAR | Status: DC | PRN
  Administered 2019-09-24: 06:00:00 100 ug via INTRAVENOUS
  Filled 2019-09-24: qty 2

## 2019-09-24 MED ORDER — ONDANSETRON HCL 4 MG PO TABS: 4.0000 mg | ORAL_TABLET | ORAL | Status: DC | PRN

## 2019-09-24 MED ORDER — LACTATED RINGERS IV SOLN: 500.0000 mL | INTRAVENOUS | Status: DC | PRN

## 2019-09-24 MED ORDER — TERBUTALINE SULFATE 1 MG/ML IJ SOLN: 0.2500 mg | Freq: Once | INTRAMUSCULAR | Status: DC | PRN

## 2019-09-24 MED ORDER — PRENATAL MULTIVITAMIN CH
1.0000 | ORAL_TABLET | Freq: Every day | ORAL | Status: DC
  Administered 2019-09-24 – 2019-09-25 (×2): 1 via ORAL
  Filled 2019-09-24 (×2): qty 1

## 2019-09-24 MED ORDER — LACTATED RINGERS IV SOLN
500.0000 mL | Freq: Once | INTRAVENOUS | Status: AC
  Administered 2019-09-24: 500 mL via INTRAVENOUS

## 2019-09-24 MED ORDER — SIMETHICONE 80 MG PO CHEW: 80.0000 mg | CHEWABLE_TABLET | ORAL | Status: DC | PRN

## 2019-09-24 MED ORDER — OXYTOCIN BOLUS FROM INFUSION
500.0000 mL | Freq: Once | INTRAVENOUS | Status: AC
  Administered 2019-09-24: 500 mL via INTRAVENOUS

## 2019-09-24 NOTE — Plan of Care (Signed)
  Problem: Pain Management: Goal: Relief or control of pain from uterine contractions will improve Outcome: Completed/Met   Problem: Education: Goal: Knowledge of General Education information will improve Description: Including pain rating scale, medication(s)/side effects and non-pharmacologic comfort measures Outcome: Completed/Met   Problem: Health Behavior/Discharge Planning: Goal: Ability to manage health-related needs will improve Outcome: Completed/Met   Problem: Clinical Measurements: Goal: Ability to maintain clinical measurements within normal limits will improve Outcome: Completed/Met Goal: Will remain free from infection Outcome: Completed/Met Goal: Diagnostic test results will improve Outcome: Completed/Met Goal: Respiratory complications will improve Outcome: Completed/Met Goal: Cardiovascular complication will be avoided Outcome: Completed/Met   Problem: Activity: Goal: Risk for activity intolerance will decrease Outcome: Completed/Met   Problem: Nutrition: Goal: Adequate nutrition will be maintained Outcome: Completed/Met   Problem: Elimination: Goal: Will not experience complications related to urinary retention Outcome: Completed/Met   Problem: Pain Managment: Goal: General experience of comfort will improve Outcome: Completed/Met   Problem: Safety: Goal: Ability to remain free from injury will improve Outcome: Completed/Met   Problem: Skin Integrity: Goal: Risk for impaired skin integrity will decrease Outcome: Completed/Met   Problem: Life Cycle: Goal: Chance of risk for complications during the postpartum period will decrease Outcome: Completed/Met   Problem: Role Relationship: Goal: Ability to demonstrate positive interaction with newborn will improve Outcome: Completed/Met   Problem: Skin Integrity: Goal: Demonstration of wound healing without infection will improve Outcome: Completed/Met   Problem: Education: Goal: Knowledge of  condition will improve Outcome: Completed/Met   Problem: Activity: Goal: Will verbalize the importance of balancing activity with adequate rest periods Outcome: Completed/Met Goal: Ability to tolerate increased activity will improve Outcome: Completed/Met   Problem: Coping: Goal: Ability to identify and utilize available resources and services will improve Outcome: Completed/Met   Problem: Life Cycle: Goal: Chance of risk for complications during the postpartum period will decrease Outcome: Completed/Met   Problem: Education: Goal: Knowledge of General Education information will improve Description: Including pain rating scale, medication(s)/side effects and non-pharmacologic comfort measures Outcome: Completed/Met   Problem: Clinical Measurements: Goal: Ability to maintain clinical measurements within normal limits will improve Outcome: Completed/Met Goal: Will remain free from infection Outcome: Completed/Met Goal: Diagnostic test results will improve Outcome: Completed/Met Goal: Respiratory complications will improve Outcome: Completed/Met Goal: Cardiovascular complication will be avoided Outcome: Completed/Met   Problem: Activity: Goal: Risk for activity intolerance will decrease Outcome: Completed/Met   Problem: Nutrition: Goal: Adequate nutrition will be maintained Outcome: Completed/Met   Problem: Pain Managment: Goal: General experience of comfort will improve Outcome: Completed/Met   Problem: Safety: Goal: Ability to remain free from injury will improve Outcome: Completed/Met

## 2019-09-24 NOTE — Plan of Care (Signed)
  Problem: Pain Management: Goal: Relief or control of pain from uterine contractions will improve Outcome: Completed/Met   Problem: Education: Goal: Knowledge of General Education information will improve Description: Including pain rating scale, medication(s)/side effects and non-pharmacologic comfort measures Outcome: Completed/Met   Problem: Health Behavior/Discharge Planning: Goal: Ability to manage health-related needs will improve Outcome: Completed/Met   Problem: Clinical Measurements: Goal: Ability to maintain clinical measurements within normal limits will improve Outcome: Completed/Met Goal: Will remain free from infection Outcome: Completed/Met Goal: Diagnostic test results will improve Outcome: Completed/Met Goal: Respiratory complications will improve Outcome: Completed/Met Goal: Cardiovascular complication will be avoided Outcome: Completed/Met   Problem: Activity: Goal: Risk for activity intolerance will decrease Outcome: Completed/Met   Problem: Nutrition: Goal: Adequate nutrition will be maintained Outcome: Completed/Met   Problem: Elimination: Goal: Will not experience complications related to urinary retention Outcome: Completed/Met   Problem: Pain Managment: Goal: General experience of comfort will improve Outcome: Completed/Met   Problem: Safety: Goal: Ability to remain free from injury will improve Outcome: Completed/Met   Problem: Skin Integrity: Goal: Risk for impaired skin integrity will decrease Outcome: Completed/Met   Problem: Education: Goal: Knowledge of condition will improve Outcome: Completed/Met   Problem: Activity: Goal: Will verbalize the importance of balancing activity with adequate rest periods Outcome: Completed/Met Goal: Ability to tolerate increased activity will improve Outcome: Completed/Met   Problem: Life Cycle: Goal: Chance of risk for complications during the postpartum period will decrease Outcome:  Completed/Met   Problem: Role Relationship: Goal: Ability to demonstrate positive interaction with newborn will improve Outcome: Completed/Met   Problem: Skin Integrity: Goal: Demonstration of wound healing without infection will improve Outcome: Completed/Met   Problem: Education: Goal: Knowledge of condition will improve Outcome: Completed/Met   Problem: Activity: Goal: Will verbalize the importance of balancing activity with adequate rest periods Outcome: Completed/Met Goal: Ability to tolerate increased activity will improve Outcome: Completed/Met   Problem: Coping: Goal: Ability to identify and utilize available resources and services will improve Outcome: Completed/Met   Problem: Life Cycle: Goal: Chance of risk for complications during the postpartum period will decrease Outcome: Completed/Met   Problem: Role Relationship: Goal: Ability to demonstrate positive interaction with newborn will improve Outcome: Completed/Met   Problem: Skin Integrity: Goal: Demonstration of wound healing without infection will improve Outcome: Completed/Met   Problem: Education: Goal: Knowledge of General Education information will improve Description: Including pain rating scale, medication(s)/side effects and non-pharmacologic comfort measures Outcome: Completed/Met   Problem: Clinical Measurements: Goal: Ability to maintain clinical measurements within normal limits will improve Outcome: Completed/Met Goal: Will remain free from infection Outcome: Completed/Met Goal: Diagnostic test results will improve Outcome: Completed/Met Goal: Respiratory complications will improve Outcome: Completed/Met Goal: Cardiovascular complication will be avoided Outcome: Completed/Met   Problem: Activity: Goal: Risk for activity intolerance will decrease Outcome: Completed/Met   Problem: Nutrition: Goal: Adequate nutrition will be maintained Outcome: Completed/Met   Problem: Pain  Managment: Goal: General experience of comfort will improve Outcome: Completed/Met   Problem: Safety: Goal: Ability to remain free from injury will improve Outcome: Completed/Met

## 2019-09-24 NOTE — H&P (Signed)
OBSTETRIC ADMISSION HISTORY AND PHYSICAL  Michele Hayden is a 29 y.o. female [phone] with IUP at [redacted]w[redacted]d by LMP presenting for SOL. Reports ctx over past 24 hours increasing in strength and frequency. She reports +FMs, No LOF, no VB, no blurry vision, headaches or peripheral edema, and RUQ pain.  She plans on breast feeding. She is undecided for birth control. She received her prenatal care at [facility]   Sono:  3/22  @[redacted]w[redacted]d , CWD, normal anatomy, cephalic presentation, posterior placental lie, 3108g, 35% EFW  Prenatal History/Complications: Maternal Obesity Hx of gHTN Hx of placental abruption with subsequent c-section at [redacted]w[redacted]d and infant passed within 28 days of life Thrombocytopenia   Past Medical History: Past Medical History:  Diagnosis Date  . Chlamydia infection complicating pregnancy in second trimester 04/10/2013  . GERD (gastroesophageal reflux disease)   . History of substance abuse (HCC)   . Pregnancy induced hypertension     Past Surgical History: Past Surgical History:  Procedure Laterality Date  . CESAREAN SECTION N/A 04/13/2014   Procedure: CESAREAN SECTION;  Surgeon: 04/15/2014, MD;  Location: [facility];  Service: Obstetrics;  Laterality: N/A;  . DILATION AND CURETTAGE OF UTERUS    . WISDOM TOOTH EXTRACTION      Obstetrical History: OB History    Gravida  6   Para  3   Term  2   Preterm  1   AB  2   Living  2     SAB  1   TAB  1   Ectopic      Multiple  0   Live Births  3        Obstetric Comments  # abruption- preterm at 23.5 weeks and baby passed.  Has 2 living children now.         Social History: Social History   Socioeconomic History  . Marital status: Single    Spouse name: Not on file  . Number of children: Not on file  . Years of education: Not on file  . Highest education level: Not on file  Occupational History  . Occupation: CNA  Tobacco Use  . Smoking status: Former Smoker    Packs/day: 0.50    Years: 7.00     Pack years: 3.50    Types: Cigarettes    Quit date: 06/28/2014    Years since quitting: 5.2  . Smokeless tobacco: Never Used  Substance and Sexual Activity  . Alcohol use: Not Currently    Alcohol/week: 0.0 standard drinks    Comment: occasional  . Drug use: No    Types: Cocaine, Marijuana    Comment: y- last use as of 02/2015  . Sexual activity: Yes    Birth control/protection: None  Other Topics Concern  . Not on file  Social History Narrative  . Not on file   Social Determinants of Health   Financial Resource Strain:   . Difficulty of Paying Living Expenses:   Food Insecurity:   . Worried About 03/2015 in the Last Year:   . Programme researcher, broadcasting/film/video in the Last Year:   Transportation Needs:   . Barista (Medical):   Freight forwarder Lack of Transportation (Non-Medical):   Physical Activity:   . Days of Exercise per Week:   . Minutes of Exercise per Session:   Stress:   . Feeling of Stress :   Social Connections:   . Frequency of Communication with Friends and Family:   .  Frequency of Social Gatherings with Friends and Family:   . Attends Religious Services:   . Active Member of Clubs or Organizations:   . Attends Banker Meetings:   Marland Kitchen Marital Status:     Family History: Family History  Problem Relation Age of Onset  . Asthma Brother   . Diabetes Paternal Grandmother   . Kidney disease Paternal Grandmother   . Heart disease Paternal Grandmother   . Heart attack Paternal Grandmother   . Diabetes Maternal Aunt     Allergies: No Known Allergies  Facility-Administered Medications Prior to Admission  Medication Dose Route Frequency Provider Last Rate Last Admin  . misoprostol (CYTOTEC) tablet 25 mcg  25 mcg Vaginal Q4H PRN [place] Bing, MD       Medications Prior to Admission  Medication Sig Dispense Refill Last Dose  . Cetirizine HCl (ZYRTEC PO) Take by mouth.   Past Week at Unknown time  . Prenatal Vit-Fe Fumarate-FA  (MULTIVITAMIN-PRENATAL) 27-0.8 MG TABS tablet Take 1 tablet by mouth daily at 12 noon.   09/24/2019 at Unknown time  . cyanocobalamin ([employer]S VITAMIN B12) 2000 MCG tablet Take 0.5 tablets (1,000 mcg total) by mouth daily. (Patient not taking: Reported on 07/11/2019) 30 tablet 11   . fluticasone (FLONASE) 50 MCG/ACT nasal spray Place 1 spray into both nostrils daily. (Patient not taking: Reported on 01/22/2018) 16 g 2   . progesterone (PROMETRIUM) 200 MG capsule Place one capsule vaginally at bedtime (Patient not taking: Reported on 09/17/2019) 30 capsule 4      Review of Systems   All systems reviewed and negative except as stated in HPI  Blood pressure 121/68, pulse 85, temperature 98 F (36.7 C), temperature source Oral, resp. rate 18, height 5\' 7"  (1.702 m), weight 121.3 kg, last menstrual period 12/13/2018. General appearance: alert, cooperative and appears stated age Lungs: normal effort Heart: regular rate  Abdomen: soft, non-tender; bowel sounds normal Pelvic: gravid uterus GU: No vaginal lesions  Extremities: Homans sign is negative, no sign of DVT Presentation: cephalic Fetal monitoringBaseline: 140 bpm, Variability: Good {> 6 bpm), Accelerations: Reactive and Decelerations: Absent Uterine activity: Frequency: Every 4 minutes per patient  Dilation: 6 Effacement (%): 80 Station: -2 Exam by:: Dr. [email or web address]   Prenatal labs: ABO, Rh: --/--/O POS (03/29 0400) Antibody: NEG (03/29 0400) Rubella: 16.10 (09/04 0948) RPR: Non Reactive (12/30 1004)  HBsAg: Negative (09/04 0948)  HIV: Non Reactive (12/30 1004)  GBS: Negative/-- (03/08 1604)  2 hr Glucola WNL Genetic screening  Low risk Anatomy 01-15-1991 WNL  Prenatal Transfer Tool  Maternal Diabetes: No Genetic Screening: Normal Maternal Ultrasounds/Referrals: Normal Fetal Ultrasounds or other Referrals:  None Maternal Substance Abuse:  No Significant Maternal Medications:  None Significant Maternal Lab Results: Group B Strep  negative  Results for orders placed or performed during the hospital encounter of 09/24/19 (from the past 24 hour(s))  Respiratory Panel by RT PCR (Flu A&B, Covid) - Nasopharyngeal Swab   Collection Time: 09/24/19  3:43 AM   Specimen: Nasopharyngeal Swab  Result Value Ref Range   SARS Coronavirus 2 by RT PCR NEGATIVE NEGATIVE   Influenza A by PCR NEGATIVE NEGATIVE   Influenza B by PCR NEGATIVE NEGATIVE  Protein / creatinine ratio, urine   Collection Time: 09/24/19  3:44 AM  Result Value Ref Range   Creatinine, Urine 107.20 mg/dL   Total Protein, Urine 6 mg/dL   Protein Creatinine Ratio 0.06 0.00 - 0.15 mg/mg[Cre]  CBC   Collection Time: 09/24/19  4:00 AM  Result Value Ref Range   WBC 8.5 4.0 - 10.5 K/uL   RBC 3.85 (L) 3.87 - 5.11 MIL/uL   Hemoglobin 11.5 (L) 12.0 - 15.0 g/dL   HCT 35.2 (L) 36.0 - 46.0 %   MCV 91.4 80.0 - 100.0 fL   MCH 29.9 26.0 - 34.0 pg   MCHC 32.7 30.0 - 36.0 g/dL   RDW 13.6 11.5 - 15.5 %   Platelets 107 (L) 150 - 400 K/uL   nRBC 0.0 0.0 - 0.2 %  Comprehensive metabolic panel   Collection Time: 09/24/19  4:00 AM  Result Value Ref Range   Sodium 137 135 - 145 mmol/L   Potassium 4.3 3.5 - 5.1 mmol/L   Chloride 104 98 - 111 mmol/L   CO2 22 22 - 32 mmol/L   Glucose, Bld 111 (H) 70 - 99 mg/dL   BUN 7 6 - 20 mg/dL   Creatinine, Ser 0.65 0.44 - 1.00 mg/dL   Calcium 9.2 8.9 - 10.3 mg/dL   Total Protein 5.7 (L) 6.5 - 8.1 g/dL   Albumin 2.7 (L) 3.5 - 5.0 g/dL   AST 16 15 - 41 U/L   ALT 14 0 - 44 U/L   Alkaline Phosphatase 98 38 - 126 U/L   Total Bilirubin 0.5 0.3 - 1.2 mg/dL   GFR calc non Af Amer >60 >60 mL/min   GFR calc Af Amer >60 >60 mL/min   Anion gap 11 5 - 15  Type and screen   Collection Time: 09/24/19  4:00 AM  Result Value Ref Range   ABO/RH(D) O POS    Antibody Screen NEG    Sample Expiration      09/27/2019,2359 Performed at [facility], [address] [place], [place] [address]     Patient Active Problem List   Diagnosis  Date Noted  . Gestational thrombocytopenia ([place]) 09/24/2019  . Excessive weight gain affecting pregnancy 09/17/2019  . Supervision of high risk pregnancy, antepartum 04/04/2019  . History of VBAC 02/20/2019  . History of gestational hypertension 02/20/2019  . History of preterm delivery 02/20/2019  . Thrombocytopenia ([place]) 09/20/2018  . Eczema 05/25/2016  . History of placenta abruption 10/08/2014  . Obesity in pregnancy 04/10/2013    Assessment/Plan:  Michele Hayden is a 29 y.o. [id number] at [redacted]w[redacted]d here for SOL.  #Labor: Vertex by exam. VBAC consent signed 2/23. Making good cervical change. Expectant management. Anticipate VBAC. #Pain: Per patient request #FWB: Cat I; EFW: 3300g #ID:  GBS neg #MOF: Breast #MOC: Undecided; considering IUD vs interim BTL #Circ:  NA; female #Elevated BP: 2 elevated BP's on admission with hx of Pre-E. Asymptomatic and Pre-E labs WNL. Will continue to monitor. Does not yet meet criteria for gHTN. #Thrombocytopenia: Plt 107 on admission which has been baseline. Seen by Heme 2/23  Barrington Ellison, MD [facility] Family Medicine Fellow, [facility] for [facility] 09/24/2019, 5:25 AM

## 2019-09-24 NOTE — MAU Note (Signed)
..  Michele Hayden is a 29 y.o. at [redacted]w[redacted]d here in MAU reporting: CTX every 4-5 minutes that began around 0100. Denies any vaginal bleeding or LOF. +FM

## 2019-09-24 NOTE — Discharge Summary (Signed)
Postpartum Discharge Summary     Patient Name: Michele Hayden DOB: 1991/06/22 MRN: [id number]  Date of admission: 09/24/2019 Delivering Provider: Chauncey Mann   Date of discharge: 09/26/2019  Admitting diagnosis: Gestational thrombocytopenia ([place]) [O99.119, D69.6] Intrauterine pregnancy: [redacted]w[redacted]d    Secondary diagnosis:  Active Problems:   Obesity in pregnancy   History of placenta abruption   Thrombocytopenia ([place]   History of VBAC   History of gestational hypertension   History of preterm delivery   Excessive weight gain affecting pregnancy   [place]ocyt[place] ([place]   Gestational hypertension  Additional problems: None     Discharge diagnosis: Term Pregnancy Delivered and VBAC                                                                                                Post partum procedures:None  Augmentation: None  Complications: None  Hospital course:  Onset of Labor With Vaginal Delivery     29y.o. yo [id number] admitted in Latent Labor on 09/24/2019. Patient had an uncomplicated labor course as follows: Initial SVE: 4.5/50/-3. Patient received epidural, SROM occurred. She then progressed to complete.  Membrane Rupture Time/Date: 6:55 AM ,09/24/2019   Intrapartum Procedures: Episiotomy: None [1]                                         Lacerations:  1st degree [2]  Patient had a delivery of a Viable infant. 09/24/2019  Information for the patient's newborn:  BaMckaylie, Vaseyirl BrT[place]0[[id number]]Delivery Method: VBAC, Spontaneous(Filed from Delivery Summary)     Pateint had an uncomplicated postpartum course.  She is ambulating, tolerating a regular diet, passing flatus, and urinating well. Patient is discharged home in stable condition on 09/26/19.  Delivery time: 7:11 AM    Magnesium Sulfate received: No BMZ received: No Rhophylac:No MMR:No Transfusion:No  Physical exam  Vitals:   09/25/19 0515 09/25/19 1430 09/25/19 2130  09/26/19 0631  BP: 114/78 123/82 120/78 111/70  Pulse: 65 74 79 73  Resp: 18 18 18 18   Temp: 98.2 F (36.8 C) 98.3 F (36.8 C) 98.2 F (36.8 C) 97.6 F (36.4 C)  TempSrc: Oral Oral Oral Oral  SpO2:   100% 100%  Weight:      Height:       General: alert, cooperative and no distress Lochia: appropriate Uterine Fundus: firm Incision: N/A DVT Evaluation: No evidence of DVT seen on physical exam. Labs: Lab Results  Component Value Date   WBC 10.6 (H) 09/24/2019   HGB 11.5 (L) 09/24/2019   HCT 35.2 (L) 09/24/2019   MCV 92.1 09/24/2019   PLT 114 (L) 09/24/2019   CMP Latest Ref Rng & Units 09/24/2019  Glucose 70 - 99 mg/dL 111(H)  BUN 6 - 20 mg/dL 7  Creatinine 0.44 - 1.00 mg/dL 0.65  Sodium 135 - 145 mmol/L 137  Potassium 3.5 - 5.1 mmol/L 4.3  Chloride 98 - 111 mmol/L 104  CO2 22 - 32  mmol/L 22  Calcium 8.9 - 10.3 mg/dL 9.2  Total Protein 6.5 - 8.1 g/dL 5.7(L)  Total Bilirubin 0.3 - 1.2 mg/dL 0.5  Alkaline Phos 38 - 126 U/L 98  AST 15 - 41 U/L 16  ALT 0 - 44 U/L 14   Edinburgh Score: Edinburgh Postnatal Depression Scale Screening Tool 09/25/2019  I have been able to laugh and see the funny side of things. 0  I have looked forward with enjoyment to things. 0  I have blamed myself unnecessarily when things went wrong. 0  I have been anxious or worried for no good reason. 0  I have felt scared or panicky for no good reason. 0  Things have been getting on top of me. 0  I have been so unhappy that I have had difficulty sleeping. 0  I have felt sad or miserable. 0  I have been so unhappy that I have been crying. 0  The thought of harming myself has occurred to me. 0  Edinburgh Postnatal Depression Scale Total 0    Discharge instruction: per After Visit Summary and "Baby and Me Booklet".  After visit meds:  Allergies as of 09/26/2019   No Known Allergies     Medication List    TAKE these medications   acetaminophen 325 MG tablet Commonly known as: Tylenol Take 2  tablets (650 mg total) by mouth every 6 (six) hours as needed (for pain scale < 4).   ascorbic acid 500 MG tablet Commonly known as: VITAMIN C Take 500 mg by mouth daily.   cetirizine 10 MG tablet Commonly known as: ZYRTEC Take 10 mg by mouth daily.   cholecalciferol 25 MCG (1000 UNIT) tablet Commonly known as: VITAMIN D3 Take 1,000 Units by mouth daily.   cyanocobalamin 2000 MCG tablet Commonly known as: CVS VITAMIN B12 Take 0.5 tablets (1,000 mcg total) by mouth daily.   ibuprofen 600 MG tablet Commonly known as: ADVIL Take 1 tablet (600 mg total) by mouth every 8 (eight) hours as needed for mild pain.   prenatal multivitamin Tabs tablet Take 1 tablet by mouth daily at 12 noon.       Diet: routine diet  Activity: Advance as tolerated. Pelvic rest for 6 weeks.   Outpatient follow up:4 weeks Follow up Appt: Future Appointments  Date Time Provider [place]  11/08/2019  1:15 PM Aletha Halim, MD [facility]   Follow up Visit:    Please schedule this patient for Postpartum visit in: 4 weeks with the following provider: Any provider Virtual For C/S patients schedule nurse incision check in weeks 2 weeks: no High risk pregnancy complicated by: TOLAC Delivery mode:  SVD Anticipated Birth Control:  other/unsure PP Procedures needed: BP check  Schedule Integrated BH visit: no     Newborn Data: Live born female  Birth Weight: 2855 g  APGAR: 71, 9  Newborn Delivery   Birth date/time: 09/24/2019 07:11:00 Delivery type: VBAC, Spontaneous      Baby Feeding: Breast Disposition:home with mother   09/26/2019 Merilyn Baba, DO

## 2019-09-24 NOTE — Anesthesia Postprocedure Evaluation (Signed)
Anesthesia Post Note  Patient: Michele Hayden  Procedure(s) Performed: AN AD HOC LABOR EPIDURAL     Patient location during evaluation: Mother Baby Anesthesia Type: Epidural Level of consciousness: awake and alert Pain management: pain level controlled Vital Signs Assessment: post-procedure vital signs reviewed and stable Respiratory status: spontaneous breathing, nonlabored ventilation and respiratory function stable Cardiovascular status: stable Postop Assessment: no headache, no backache, epidural receding, patient able to bend at knees, no apparent nausea or vomiting, adequate PO intake and able to ambulate Anesthetic complications: no    Last Vitals:  Vitals:   09/24/19 1011 09/24/19 1439  BP: 121/74 112/60  Pulse: 68 79  Resp: 18 18  Temp: 36.8 C 36.7 C  SpO2: 100% 100%    Last Pain:  Vitals:   09/24/19 1440  TempSrc:   PainSc: 0-No pain   Pain Goal: Patients Stated Pain Goal: 5 (09/24/19 0425)              Epidural/Spinal Function Cutaneous sensation: Normal sensation (09/24/19 1440), Patient able to flex knees: Yes (09/24/19 1440), Patient able to lift hips off bed: Yes (09/24/19 1440), Back pain beyond tenderness at insertion site: No (09/24/19 1440), Progressively worsening motor and/or sensory loss: No (09/24/19 1440), Bowel and/or bladder incontinence post epidural: No (09/24/19 1440)  Blythe Stanford

## 2019-09-24 NOTE — Progress Notes (Addendum)
During shift change report in the patient's  room. RN stated that mom did not wish to breast feed the baby.

## 2019-09-24 NOTE — Anesthesia Procedure Notes (Signed)
Epidural Patient location during procedure: OB Start time: 09/24/2019 5:58 AM End time: 09/24/2019 6:08 AM  Staffing Anesthesiologist: Cecile Hearing, MD Performed: anesthesiologist   Preanesthetic Checklist Completed: patient identified, IV checked, risks and benefits discussed, monitors and equipment checked, pre-op evaluation and timeout performed  Epidural Patient position: sitting Prep: DuraPrep Patient monitoring: blood pressure and continuous pulse ox Approach: midline Location: L3-L4 Injection technique: LOR air  Needle:  Needle type: Tuohy  Needle gauge: 18 G Needle length: 15 cm Needle insertion depth: 9 cm Catheter size: 19 Gauge Catheter at skin depth: 14 cm Test dose: negative and Other (1% Lidocaine)  Additional Notes Patient identified.  Risk benefits discussed including failed block, incomplete pain control, headache, nerve damage, paralysis, blood pressure changes, nausea, vomiting, reactions to medication both toxic or allergic, and postpartum back pain.  Patient expressed understanding and wished to proceed.  All questions were answered.  Sterile technique used throughout procedure and epidural site dressed with sterile barrier dressing. No paresthesia or other complications noted. The patient did not experience any signs of intravascular injection such as tinnitus or metallic taste in mouth nor signs of intrathecal spread such as rapid motor block. Please see nursing notes for vital signs. Reason for block:procedure for pain

## 2019-09-24 NOTE — Anesthesia Preprocedure Evaluation (Signed)
Anesthesia Evaluation  Patient identified by MRN, date of birth, ID band Patient awake    Reviewed: Allergy & Precautions, NPO status , Patient's Chart, lab work & pertinent test results  Airway Mallampati: II  TM Distance: >3 FB Neck ROM: Full    Dental  (+) Teeth Intact, Dental Advisory Given   Pulmonary neg pulmonary ROS, former smoker,    Pulmonary exam normal breath sounds clear to auscultation       Cardiovascular hypertension, Normal cardiovascular exam Rhythm:Regular Rate:Normal     Neuro/Psych negative neurological ROS     GI/Hepatic GERD  ,(+)     substance abuse  ,   Endo/Other  Morbid obesity  Renal/GU negative Renal ROS     Musculoskeletal negative musculoskeletal ROS (+)   Abdominal   Peds  Hematology  (+) Blood dyscrasia (Plt 107k), ,   Anesthesia Other Findings Day of surgery medications reviewed with the patient.  Reproductive/Obstetrics (+) Pregnancy                             Anesthesia Physical Anesthesia Plan  ASA: III  Anesthesia Plan: Epidural   Post-op Pain Management:    Induction:   PONV Risk Score and Plan: 2 and Treatment may vary due to age or medical condition  Airway Management Planned: Natural Airway  Additional Equipment:   Intra-op Plan:   Post-operative Plan:   Informed Consent: I have reviewed the patients History and Physical, chart, labs and discussed the procedure including the risks, benefits and alternatives for the proposed anesthesia with the patient or authorized representative who has indicated his/her understanding and acceptance.     Dental advisory given  Plan Discussed with:   Anesthesia Plan Comments: (Patient identified. Risks/Benefits/Options discussed with patient including but not limited to bleeding, infection, nerve damage, paralysis, failed block, incomplete pain control, headache, blood pressure changes,  nausea, vomiting, reactions to medication both or allergic, itching and postpartum back pain. Confirmed with bedside nurse the patient's most recent platelet count. Confirmed with patient that they are not currently taking any anticoagulation, have any bleeding history or any family history of bleeding disorders. Patient expressed understanding and wished to proceed. All questions were answered. )        Anesthesia Quick Evaluation

## 2019-09-24 NOTE — Plan of Care (Signed)
  Problem: Pain Management: Goal: Relief or control of pain from uterine contractions will improve Outcome: Completed/Met   Problem: Education: Goal: Knowledge of General Education information will improve Description: Including pain rating scale, medication(s)/side effects and non-pharmacologic comfort measures Outcome: Completed/Met   Problem: Health Behavior/Discharge Planning: Goal: Ability to manage health-related needs will improve Outcome: Completed/Met   Problem: Clinical Measurements: Goal: Ability to maintain clinical measurements within normal limits will improve Outcome: Completed/Met Goal: Will remain free from infection Outcome: Completed/Met Goal: Diagnostic test results will improve Outcome: Completed/Met Goal: Respiratory complications will improve Outcome: Completed/Met Goal: Cardiovascular complication will be avoided Outcome: Completed/Met   Problem: Activity: Goal: Risk for activity intolerance will decrease Outcome: Completed/Met   Problem: Nutrition: Goal: Adequate nutrition will be maintained Outcome: Completed/Met   Problem: Elimination: Goal: Will not experience complications related to urinary retention Outcome: Completed/Met   Problem: Pain Managment: Goal: General experience of comfort will improve Outcome: Completed/Met   Problem: Safety: Goal: Ability to remain free from injury will improve Outcome: Completed/Met   Problem: Skin Integrity: Goal: Risk for impaired skin integrity will decrease Outcome: Completed/Met   Problem: Education: Goal: Knowledge of condition will improve Outcome: Completed/Met   Problem: Activity: Goal: Will verbalize the importance of balancing activity with adequate rest periods Outcome: Completed/Met Goal: Ability to tolerate increased activity will improve Outcome: Completed/Met   Problem: Coping: Goal: Ability to identify and utilize available resources and services will improve Outcome:  Completed/Met   Problem: Life Cycle: Goal: Chance of risk for complications during the postpartum period will decrease Outcome: Completed/Met   Problem: Role Relationship: Goal: Ability to demonstrate positive interaction with newborn will improve Outcome: Completed/Met   Problem: Skin Integrity: Goal: Demonstration of wound healing without infection will improve Outcome: Completed/Met   Problem: Education: Goal: Knowledge of condition will improve Outcome: Completed/Met   Problem: Activity: Goal: Will verbalize the importance of balancing activity with adequate rest periods Outcome: Completed/Met Goal: Ability to tolerate increased activity will improve Outcome: Completed/Met   Problem: Coping: Goal: Ability to identify and utilize available resources and services will improve Outcome: Completed/Met   Problem: Life Cycle: Goal: Chance of risk for complications during the postpartum period will decrease Outcome: Completed/Met   Problem: Role Relationship: Goal: Ability to demonstrate positive interaction with newborn will improve Outcome: Completed/Met   Problem: Skin Integrity: Goal: Demonstration of wound healing without infection will improve Outcome: Completed/Met   Problem: Education: Goal: Knowledge of General Education information will improve Description: Including pain rating scale, medication(s)/side effects and non-pharmacologic comfort measures Outcome: Completed/Met   Problem: Clinical Measurements: Goal: Ability to maintain clinical measurements within normal limits will improve Outcome: Completed/Met Goal: Will remain free from infection Outcome: Completed/Met Goal: Diagnostic test results will improve Outcome: Completed/Met Goal: Respiratory complications will improve Outcome: Completed/Met Goal: Cardiovascular complication will be avoided Outcome: Completed/Met   Problem: Activity: Goal: Risk for activity intolerance will decrease Outcome:  Completed/Met   Problem: Nutrition: Goal: Adequate nutrition will be maintained Outcome: Completed/Met   Problem: Elimination: Goal: Will not experience complications related to urinary retention Outcome: Completed/Met   Problem: Pain Managment: Goal: General experience of comfort will improve Outcome: Completed/Met   Problem: Safety: Goal: Ability to remain free from injury will improve Outcome: Completed/Met   Problem: Skin Integrity: Goal: Risk for impaired skin integrity will decrease Outcome: Completed/Met

## 2019-09-25 ENCOUNTER — Inpatient Hospital Stay (HOSPITAL_COMMUNITY): Admission: AD | Admit: 2019-09-25 | Payer: Self-pay | Source: Home / Self Care | Admitting: Obstetrics and Gynecology

## 2019-09-25 ENCOUNTER — Inpatient Hospital Stay (HOSPITAL_COMMUNITY): Payer: Self-pay

## 2019-09-25 NOTE — Progress Notes (Addendum)
POSTPARTUM PROGRESS NOTE  Post Partum Day 1  Subjective:  Michele Hayden is a 29 y.o. [id number] s/p VBAC at [redacted]w[redacted]d.  She reports she is doing well. No acute events overnight. She denies any problems with ambulating, voiding or po intake. Denies nausea or vomiting.  Pain is well controlled.  Lochia is appropriate and improving.  Objective: Blood pressure 114/78, pulse 65, temperature 98.2 F (36.8 C), temperature source Oral, resp. rate 18, height 5\' 7"  (1.702 m), weight 121.3 kg, last menstrual period 12/13/2018, SpO2 100 %, unknown if currently breastfeeding.  Physical Exam:  General: alert, cooperative and no distress Chest: no respiratory distress Heart:regular rate, distal pulses intact Abdomen: soft, nontender Uterine Fundus: firm, appropriately tender DVT Evaluation: No calf swelling or tenderness Extremities: trace edema Skin: warm, dry  Recent Labs    09/24/19 0400 09/24/19 0822  HGB 11.5* 11.5*  HCT 35.2* 35.2*    Assessment/Plan: 09/26/19 A Cutting is a 29 y.o. 26 s/p VBAC at [redacted]w[redacted]d   PPD#1 - Doing well. Continue routine postpartum care. Contraception: Undecided Feeding: Breast gHTN:  No elevated BP's since delivery. Continue to monitor. Dispo: Plan for discharge today or tomorrow. Patient is okay to be discharged today if baby is also safe for discharge.   LOS: 1 day    [redacted]w[redacted]d MD, PGY-1 OBGYN Faculty Teaching Service  09/25/2019, 7:00 AM   I saw and evaluated the patient. I agree with the findings and the plan of care as documented in the resident's note.  09/27/2019, MD [facility] Family Medicine Fellow, [facility] for [facility]

## 2019-09-26 MED ORDER — IBUPROFEN 600 MG PO TABS: 600.0000 mg | ORAL_TABLET | Freq: Three times a day (TID) | ORAL | 0 refills | Status: AC | PRN

## 2019-09-26 MED ORDER — ACETAMINOPHEN 325 MG PO TABS: 650.0000 mg | ORAL_TABLET | Freq: Four times a day (QID) | ORAL | 0 refills | Status: AC | PRN

## 2019-09-26 NOTE — Discharge Instructions (Signed)

## 2019-11-08 ENCOUNTER — Ambulatory Visit: Payer: Medicaid Other | Admitting: Obstetrics and Gynecology

## 2019-11-22 ENCOUNTER — Encounter: Payer: Self-pay | Admitting: Obstetrics and Gynecology

## 2019-11-22 ENCOUNTER — Other Ambulatory Visit: Payer: Self-pay

## 2019-11-22 ENCOUNTER — Ambulatory Visit (INDEPENDENT_AMBULATORY_CARE_PROVIDER_SITE_OTHER): Payer: Medicaid Other | Admitting: Obstetrics and Gynecology

## 2019-11-22 VITALS — BP 136/85 | HR 72 | Wt 239.0 lb

## 2019-11-22 DIAGNOSIS — Z3009 Encounter for other general counseling and advice on contraception: Secondary | ICD-10-CM

## 2019-11-22 DIAGNOSIS — D696 Thrombocytopenia, unspecified: Secondary | ICD-10-CM

## 2019-11-22 LAB — CBC
Hematocrit: 37.8 % (ref 34.0–46.6)
Hemoglobin: 12.6 g/dL (ref 11.1–15.9)
MCH: 30.2 pg (ref 26.6–33.0)
MCHC: 33.3 g/dL (ref 31.5–35.7)
MCV: 91 fL (ref 79–97)
Platelets: 156 10*3/uL (ref 150–450)
RBC: 4.17 x10E6/uL (ref 3.77–5.28)
RDW: 14.2 % (ref 11.7–15.4)
WBC: 8.5 10*3/uL (ref 3.4–10.8)

## 2019-11-22 NOTE — Progress Notes (Signed)
Center for [facility] 11/22/2019   Post Partum Visit Note  Michele Hayden is a 29 y.o. [id number] for a scheduled postpartum visit. She is 7 weeks postpartum following a VBAC/intact perineum: I have fully reviewed the prenatal and intrapartum course. The delivery was at 39.1 gestational weeks.  Anesthesia: Epdiual. Postpartum course has been uncomplicated . Baby is doing well Baby is feeding by breast. Bleeding not at this time. Bowel function is normal. Bladder function is normal. Patient is not sexually active. Contraception method is nothing at this time. Postpartum depression screening:  negative Pap Smear due 2023.  She had a period and it was normal. No current VB  Review of Systems Pertinent items are noted in HPI.    Objective:  Blood pressure 136/85, pulse 72, weight 239 lb (108.4 kg), unknown if currently breastfeeding.  NAD  Assessment:    Normal postpartum exam.  Plan:   Essential components of care per ACOG recommendations:  1.  Mood and well being: doing well. EPDS score zero  2. Infant care and feeding:  Formula feeding only  3. Sexuality, contraception and birth spacing No issues. Patient leaning towards LNG IUD or vaginal ring. Information given. She has no contraindications to estrogen methods. D/w her I recommend 1 month BP check if she chooses an estrogen method given borderline BP   4. Sleep and fatigue No issues  5. Physical Recovery  Doing well  6.  Health Maintenance  7. Medical Follow up platelet count today.   [employer], MD Center for [employer], [facility]

## 2019-11-25 ENCOUNTER — Encounter: Payer: Self-pay | Admitting: Obstetrics and Gynecology

## 2019-12-04 ENCOUNTER — Encounter: Payer: Self-pay | Admitting: Radiology
